# Patient Record
Sex: Male | Born: 1945
Health system: Southern US, Community
[De-identification: ages and names within clinical notes are randomized; demographics above are authoritative.]

## PROBLEM LIST (undated history)

## (undated) DIAGNOSIS — I2510 Atherosclerotic heart disease of native coronary artery without angina pectoris: Secondary | ICD-10-CM

## (undated) DIAGNOSIS — J189 Pneumonia, unspecified organism: Secondary | ICD-10-CM

## (undated) DIAGNOSIS — K219 Gastro-esophageal reflux disease without esophagitis: Secondary | ICD-10-CM

## (undated) DIAGNOSIS — M2540 Effusion, unspecified joint: Secondary | ICD-10-CM

## (undated) DIAGNOSIS — R351 Nocturia: Secondary | ICD-10-CM

## (undated) DIAGNOSIS — F419 Anxiety disorder, unspecified: Secondary | ICD-10-CM

## (undated) DIAGNOSIS — J45909 Unspecified asthma, uncomplicated: Secondary | ICD-10-CM

## (undated) DIAGNOSIS — H269 Unspecified cataract: Secondary | ICD-10-CM

## (undated) DIAGNOSIS — I10 Essential (primary) hypertension: Secondary | ICD-10-CM

## (undated) DIAGNOSIS — K5900 Constipation, unspecified: Secondary | ICD-10-CM

## (undated) DIAGNOSIS — M1990 Unspecified osteoarthritis, unspecified site: Secondary | ICD-10-CM

## (undated) DIAGNOSIS — M549 Dorsalgia, unspecified: Secondary | ICD-10-CM

## (undated) DIAGNOSIS — E785 Hyperlipidemia, unspecified: Secondary | ICD-10-CM

## (undated) DIAGNOSIS — R531 Weakness: Secondary | ICD-10-CM

## (undated) DIAGNOSIS — Z87442 Personal history of urinary calculi: Secondary | ICD-10-CM

## (undated) DIAGNOSIS — G8929 Other chronic pain: Secondary | ICD-10-CM

## (undated) DIAGNOSIS — E876 Hypokalemia: Secondary | ICD-10-CM

## (undated) DIAGNOSIS — E119 Type 2 diabetes mellitus without complications: Secondary | ICD-10-CM

## (undated) DIAGNOSIS — M797 Fibromyalgia: Secondary | ICD-10-CM

## (undated) DIAGNOSIS — M2550 Pain in unspecified joint: Secondary | ICD-10-CM

## (undated) DIAGNOSIS — F32A Depression, unspecified: Secondary | ICD-10-CM

## (undated) DIAGNOSIS — F329 Major depressive disorder, single episode, unspecified: Secondary | ICD-10-CM

## (undated) DIAGNOSIS — G4700 Insomnia, unspecified: Secondary | ICD-10-CM

## (undated) HISTORY — PX: CARPAL TUNNEL RELEASE: SHX101

## (undated) HISTORY — PX: KNEE ARTHROSCOPY: SUR90

## (undated) HISTORY — PX: CARDIAC CATHETERIZATION: SHX172

## (undated) HISTORY — PX: BACK SURGERY: SHX140

## (undated) HISTORY — PX: OTHER SURGICAL HISTORY: SHX169

## (undated) HISTORY — PX: SHOULDER ARTHROSCOPY: SHX128

## (undated) HISTORY — PX: COLONOSCOPY: SHX174

## (undated) HISTORY — PX: TONSILLECTOMY: SUR1361

---

## 1983-10-26 HISTORY — PX: CHOLECYSTECTOMY: SHX55

## 1994-10-25 HISTORY — PX: CORONARY ARTERY BYPASS GRAFT: SHX141

## 1997-10-25 HISTORY — PX: JOINT REPLACEMENT: SHX530

## 2008-07-04 LAB — HM COLONOSCOPY

## 2010-09-25 ENCOUNTER — Inpatient Hospital Stay (HOSPITAL_COMMUNITY)
Admission: EM | Admit: 2010-09-25 | Discharge: 2010-10-03 | Payer: Self-pay | Source: Home / Self Care | Attending: Internal Medicine | Admitting: Internal Medicine

## 2011-01-05 LAB — CBC
HCT: 42.7 % (ref 39.0–52.0)
HCT: 43.9 % (ref 39.0–52.0)
HCT: 44 % (ref 39.0–52.0)
Hemoglobin: 13.4 g/dL (ref 13.0–17.0)
Hemoglobin: 13.9 g/dL (ref 13.0–17.0)
Hemoglobin: 14.3 g/dL (ref 13.0–17.0)
Hemoglobin: 14.7 g/dL (ref 13.0–17.0)
MCH: 29.6 pg (ref 26.0–34.0)
MCH: 29.7 pg (ref 26.0–34.0)
MCH: 30.1 pg (ref 26.0–34.0)
MCHC: 32.5 g/dL (ref 30.0–36.0)
MCHC: 32.9 g/dL (ref 30.0–36.0)
MCHC: 33.5 g/dL (ref 30.0–36.0)
MCV: 88.7 fL (ref 78.0–100.0)
MCV: 91.5 fL (ref 78.0–100.0)
Platelets: 201 10*3/uL (ref 150–400)
Platelets: 214 10*3/uL (ref 150–400)
Platelets: 226 10*3/uL (ref 150–400)
Platelets: 228 10*3/uL (ref 150–400)
Platelets: 231 10*3/uL (ref 150–400)
RBC: 4.7 MIL/uL (ref 4.22–5.81)
RBC: 4.81 MIL/uL (ref 4.22–5.81)
RBC: 4.86 MIL/uL (ref 4.22–5.81)
RDW: 13.8 % (ref 11.5–15.5)
RDW: 13.8 % (ref 11.5–15.5)
RDW: 14.2 % (ref 11.5–15.5)
WBC: 12 10*3/uL — ABNORMAL HIGH (ref 4.0–10.5)
WBC: 12.5 10*3/uL — ABNORMAL HIGH (ref 4.0–10.5)
WBC: 12.9 10*3/uL — ABNORMAL HIGH (ref 4.0–10.5)
WBC: 15.3 10*3/uL — ABNORMAL HIGH (ref 4.0–10.5)

## 2011-01-05 LAB — DIFFERENTIAL
Basophils Absolute: 0 10*3/uL (ref 0.0–0.1)
Basophils Absolute: 0 10*3/uL (ref 0.0–0.1)
Basophils Relative: 0 % (ref 0–1)
Monocytes Absolute: 0.4 10*3/uL (ref 0.1–1.0)
Monocytes Absolute: 1 10*3/uL (ref 0.1–1.0)
Monocytes Relative: 7 % (ref 3–12)
Neutro Abs: 11.2 10*3/uL — ABNORMAL HIGH (ref 1.7–7.7)
Neutrophils Relative %: 80 % — ABNORMAL HIGH (ref 43–77)
Neutrophils Relative %: 90 % — ABNORMAL HIGH (ref 43–77)

## 2011-01-05 LAB — BASIC METABOLIC PANEL
BUN: 23 mg/dL (ref 6–23)
BUN: 25 mg/dL — ABNORMAL HIGH (ref 6–23)
CO2: 27 mEq/L (ref 19–32)
Chloride: 103 mEq/L (ref 96–112)
Creatinine, Ser: 0.85 mg/dL (ref 0.4–1.5)
Creatinine, Ser: 0.96 mg/dL (ref 0.4–1.5)
GFR calc Af Amer: >60 mL/min (ref >60)
GFR calc non Af Amer: >60 mL/min (ref >60)
GFR calc non Af Amer: >60 mL/min (ref >60)
Glucose, Bld: 124 mg/dL — ABNORMAL HIGH (ref 70–99)
Sodium: 135 mEq/L (ref 135–145)

## 2011-01-05 LAB — COMPREHENSIVE METABOLIC PANEL
ALT: 39 U/L (ref 0–53)
AST: 24 U/L (ref 0–37)
AST: 28 U/L (ref 0–37)
Albumin: 3.7 g/dL (ref 3.5–5.2)
Alkaline Phosphatase: 34 U/L — ABNORMAL LOW (ref 39–117)
Alkaline Phosphatase: 45 U/L (ref 39–117)
CO2: 28 mEq/L (ref 19–32)
Calcium: 9.1 mg/dL (ref 8.4–10.5)
Chloride: 104 mEq/L (ref 96–112)
Chloride: 97 mEq/L (ref 96–112)
Chloride: 99 mEq/L (ref 96–112)
Creatinine, Ser: 0.95 mg/dL (ref 0.4–1.5)
GFR calc Af Amer: >60 mL/min (ref >60)
GFR calc Af Amer: >60 mL/min (ref >60)
GFR calc non Af Amer: >60 mL/min (ref >60)
GFR calc non Af Amer: >60 mL/min (ref >60)
GFR calc non Af Amer: >60 mL/min (ref >60)
Glucose, Bld: 131 mg/dL — ABNORMAL HIGH (ref 70–99)
Glucose, Bld: 133 mg/dL — ABNORMAL HIGH (ref 70–99)
Glucose, Bld: 178 mg/dL — ABNORMAL HIGH (ref 70–99)
Total Bilirubin: 0.6 mg/dL (ref 0.3–1.2)
Total Bilirubin: 0.7 mg/dL (ref 0.3–1.2)

## 2011-01-05 LAB — LIPID PANEL
Cholesterol: 181 mg/dL (ref 0–200)
HDL: 48 mg/dL (ref >39)
Total CHOL/HDL Ratio: 3.8 RATIO
Triglycerides: 87 mg/dL (ref <150)
VLDL: 17 mg/dL (ref 0–40)

## 2011-01-05 LAB — HEMOGLOBIN A1C
Hgb A1c MFr Bld: 5.7 % — ABNORMAL HIGH (ref <5.7)
Mean Plasma Glucose: 114 mg/dL (ref <117)
Mean Plasma Glucose: 117 mg/dL — ABNORMAL HIGH (ref <117)

## 2011-01-05 LAB — APTT: aPTT: 24 seconds (ref 24–37)

## 2011-02-18 ENCOUNTER — Other Ambulatory Visit: Payer: Self-pay | Admitting: Neurosurgery

## 2011-02-18 DIAGNOSIS — M545 Low back pain: Secondary | ICD-10-CM

## 2011-02-25 ENCOUNTER — Ambulatory Visit
Admission: RE | Admit: 2011-02-25 | Discharge: 2011-02-25 | Disposition: A | Discharge status: Home / Self Care | Payer: Medicare Other | Source: Ambulatory Visit | Attending: Neurosurgery | Admitting: Neurosurgery

## 2011-02-25 DIAGNOSIS — M545 Low back pain, unspecified: Secondary | ICD-10-CM

## 2011-02-25 MED ORDER — GADOBENATE DIMEGLUMINE 529 MG/ML IV SOLN
20.0000 mL | Freq: Once | INTRAVENOUS | Status: AC | PRN
  Administered 2011-02-25: 20 mL via INTRAVENOUS

## 2012-01-13 ENCOUNTER — Other Ambulatory Visit: Payer: Self-pay | Admitting: Neurosurgery

## 2012-01-13 DIAGNOSIS — M5126 Other intervertebral disc displacement, lumbar region: Secondary | ICD-10-CM

## 2012-01-17 ENCOUNTER — Ambulatory Visit
Admission: RE | Admit: 2012-01-17 | Discharge: 2012-01-17 | Disposition: A | Discharge status: Home / Self Care | Payer: Medicare Other | Source: Ambulatory Visit | Attending: Neurosurgery | Admitting: Neurosurgery

## 2012-01-17 DIAGNOSIS — M5126 Other intervertebral disc displacement, lumbar region: Secondary | ICD-10-CM

## 2013-09-17 ENCOUNTER — Other Ambulatory Visit: Payer: Self-pay | Admitting: Neurosurgery

## 2013-09-17 NOTE — Progress Notes (Signed)
Left message for Erie Noe for orders to be signed

## 2013-09-18 ENCOUNTER — Encounter (HOSPITAL_COMMUNITY)
Admission: RE | Admit: 2013-09-18 | Discharge: 2013-09-18 | Disposition: A | Discharge status: Home / Self Care | Payer: Medicare Other | Source: Ambulatory Visit | Attending: Neurosurgery | Admitting: Neurosurgery

## 2013-09-18 ENCOUNTER — Encounter (HOSPITAL_COMMUNITY): Payer: Self-pay

## 2013-09-18 DIAGNOSIS — Z01812 Encounter for preprocedural laboratory examination: Secondary | ICD-10-CM | POA: Insufficient documentation

## 2013-09-18 DIAGNOSIS — Z01818 Encounter for other preprocedural examination: Secondary | ICD-10-CM | POA: Insufficient documentation

## 2013-09-18 HISTORY — DX: Depression, unspecified: F32.A

## 2013-09-18 HISTORY — DX: Unspecified osteoarthritis, unspecified site: M19.90

## 2013-09-18 HISTORY — DX: Anxiety disorder, unspecified: F41.9

## 2013-09-18 HISTORY — DX: Atherosclerotic heart disease of native coronary artery without angina pectoris: I25.10

## 2013-09-18 HISTORY — DX: Unspecified asthma, uncomplicated: J45.909

## 2013-09-18 HISTORY — DX: Major depressive disorder, single episode, unspecified: F32.9

## 2013-09-18 LAB — CBC
HCT: 40.8 % (ref 39.0–52.0)
MCV: 89.9 fL (ref 78.0–100.0)
Platelets: 218 10*3/uL (ref 150–400)
RDW: 14 % (ref 11.5–15.5)
WBC: 6.5 10*3/uL (ref 4.0–10.5)

## 2013-09-18 LAB — BASIC METABOLIC PANEL
BUN: 18 mg/dL (ref 6–23)
CO2: 28 mEq/L (ref 19–32)
Chloride: 101 mEq/L (ref 96–112)
Creatinine, Ser: 0.85 mg/dL (ref 0.50–1.35)
GFR calc Af Amer: >90 mL/min (ref >90)

## 2013-09-18 LAB — SURGICAL PCR SCREEN: MRSA, PCR: NEGATIVE

## 2013-09-18 NOTE — Pre-Procedure Instructions (Signed)
Michael Barr  09/18/2013   Your procedure is scheduled on:  Wed, Dec 3 @ 9:30 AM  Report to [facility] Short Stay Entrance A at 7:30 AM.  Call this number if you have problems the morning of surgery: [phone]   Remember:   Do not eat food or drink liquids after midnight.   Take these medicines the morning of surgery with A SIP OF WATER:    Do not wear jewelry.  Do not wear lotions, powders, or colognes. You may wear deodorant.  Men may shave face and neck.  Do not bring valuables to the hospital.  [facility] is not responsible                  for any belongings or valuables.               Contacts, dentures or bridgework may not be worn into surgery.  Leave suitcase in the car. After surgery it may be brought to your room.  For patients admitted to the hospital, discharge time is determined by your                treatment team.               Patients discharged the day of surgery will not be allowed to drive  home.    Special Instructions: Shower using CHG 2 nights before surgery and the night before surgery.  If you shower the day of surgery use CHG.  Use special wash - you have one bottle of CHG for all showers.  You should use approximately 1/3 of the bottle for each shower.   Please read over the following fact sheets that you were given: Pain Booklet, Coughing and Deep Breathing, MRSA Information and Surgical Site Infection Prevention

## 2013-09-18 NOTE — Progress Notes (Signed)
Anesthesia Chart Review:  Patient is a 67 year old male scheduled for right L3-4 laminectomy on 09/26/13 by Dr. Wynetta Emery.  History includes CAD s/p CABG X 1 '96, asthma, anxiety, depression, arthritis, cholecystectomy, left TKA, decompressive L3-4 laminectomy 10/21/10. PCP is Dr. Mechele Collin.    I was given patient's chart to review after he had left his PAT visit.  I was not able to reach him this afternoon on his house phone.  According to his PAT RN, patient underwent CABG X 1 (questionable LIMA to LAD) for "widow maker" lesion.  His previous cardiologist retired, so now he is being followed by his Internist Dr. Tiburcio Pea.  His last stress test was > 5 years ago. Dr. Tiburcio Pea is aware of plans for surgery and saw patient on 09/04/13.  He did a preoperative CXR and EKG.  Patient was asymptomatic from a CAD standpoint and EKG was stable so no further testing was ordered.  EKG on 09/04/13 (Dr. Tiburcio Pea) showed NSR, inferior Q waves that were stable since 2013.  No worrisome ST/T wave changes.  CXR report on 09/04/13 (Dr. Tiburcio Pea) showed: no evidence of cardiomegaly or CHF, well-expanded lungs free of infiltrates, nodules, mass, or pleural effusion. Bones and mediastinum are normal.  No evidence of aucte or chronic cardiopulmonary process.  Preoperative labs noted.  Chart and PCP notes reviewed with anesthesiologist Dr. Michelle Piper.  Since patient was recently seen by Dr. Tiburcio Pea, who is aware of plans for surgery, and patient was felt stable from a CAD standpoint then it is anticipated that he can proceed as planned if no acute changes.  Further evaluation by his assigned anesthesiologist on the day of surgery.  Velna Ochs [facility] Short Stay [facility]/Anesthesiology Phone [phone] 09/18/2013 4:36 PM

## 2013-09-18 NOTE — Progress Notes (Signed)
Ekg,cxr,last stress test req. From Dr Tiburcio Pea in [place].

## 2013-09-25 MED ORDER — CEFAZOLIN SODIUM-DEXTROSE 2-3 GM-% IV SOLR
2.0000 g | INTRAVENOUS | Status: DC
  Filled 2013-09-25: qty 50

## 2013-09-25 MED ORDER — DEXAMETHASONE SODIUM PHOSPHATE 10 MG/ML IJ SOLN
10.0000 mg | INTRAMUSCULAR | Status: AC
  Administered 2013-09-26: 10 mg via INTRAVENOUS
  Filled 2013-09-25: qty 1

## 2013-09-26 ENCOUNTER — Encounter (HOSPITAL_COMMUNITY): Payer: Self-pay | Admitting: Certified Registered Nurse Anesthetist

## 2013-09-26 ENCOUNTER — Inpatient Hospital Stay (HOSPITAL_COMMUNITY)
Admission: RE | Admit: 2013-09-26 | Discharge: 2013-09-27 | DRG: 520 | Disposition: A | Discharge status: Home / Self Care | Payer: Medicare Other | Source: Ambulatory Visit | Attending: Neurosurgery | Admitting: Neurosurgery

## 2013-09-26 ENCOUNTER — Inpatient Hospital Stay (HOSPITAL_COMMUNITY): Payer: Medicare Other

## 2013-09-26 ENCOUNTER — Encounter (HOSPITAL_COMMUNITY): Payer: Medicare Other | Admitting: Vascular Surgery

## 2013-09-26 ENCOUNTER — Inpatient Hospital Stay (HOSPITAL_COMMUNITY): Payer: Medicare Other | Admitting: Certified Registered Nurse Anesthetist

## 2013-09-26 ENCOUNTER — Encounter (HOSPITAL_COMMUNITY)
Admission: RE | Disposition: A | Discharge status: Home / Self Care | Payer: Self-pay | Source: Ambulatory Visit | Attending: Neurosurgery

## 2013-09-26 DIAGNOSIS — Z96659 Presence of unspecified artificial knee joint: Secondary | ICD-10-CM

## 2013-09-26 DIAGNOSIS — Z951 Presence of aortocoronary bypass graft: Secondary | ICD-10-CM

## 2013-09-26 DIAGNOSIS — Z9089 Acquired absence of other organs: Secondary | ICD-10-CM

## 2013-09-26 DIAGNOSIS — M5126 Other intervertebral disc displacement, lumbar region: Principal | ICD-10-CM | POA: Diagnosis present

## 2013-09-26 DIAGNOSIS — I10 Essential (primary) hypertension: Secondary | ICD-10-CM | POA: Diagnosis present

## 2013-09-26 DIAGNOSIS — J45909 Unspecified asthma, uncomplicated: Secondary | ICD-10-CM | POA: Diagnosis present

## 2013-09-26 DIAGNOSIS — E119 Type 2 diabetes mellitus without complications: Secondary | ICD-10-CM | POA: Diagnosis present

## 2013-09-26 DIAGNOSIS — Z880 Allergy status to penicillin: Secondary | ICD-10-CM

## 2013-09-26 DIAGNOSIS — Z7982 Long term (current) use of aspirin: Secondary | ICD-10-CM

## 2013-09-26 DIAGNOSIS — Z79899 Other long term (current) drug therapy: Secondary | ICD-10-CM

## 2013-09-26 DIAGNOSIS — I2510 Atherosclerotic heart disease of native coronary artery without angina pectoris: Secondary | ICD-10-CM | POA: Diagnosis present

## 2013-09-26 DIAGNOSIS — F411 Generalized anxiety disorder: Secondary | ICD-10-CM | POA: Diagnosis present

## 2013-09-26 DIAGNOSIS — F3289 Other specified depressive episodes: Secondary | ICD-10-CM | POA: Diagnosis present

## 2013-09-26 DIAGNOSIS — F329 Major depressive disorder, single episode, unspecified: Secondary | ICD-10-CM | POA: Diagnosis present

## 2013-09-26 DIAGNOSIS — Z87891 Personal history of nicotine dependence: Secondary | ICD-10-CM

## 2013-09-26 HISTORY — PX: LUMBAR LAMINECTOMY/DECOMPRESSION MICRODISCECTOMY: SHX5026

## 2013-09-26 LAB — GLUCOSE, CAPILLARY

## 2013-09-26 SURGERY — LUMBAR LAMINECTOMY/DECOMPRESSION MICRODISCECTOMY 1 LEVEL
Anesthesia: General | Site: Back | Laterality: Right

## 2013-09-26 MED ORDER — LISINOPRIL 20 MG PO TABS
20.0000 mg | ORAL_TABLET | Freq: Every day | ORAL | Status: DC
  Administered 2013-09-26 – 2013-09-27 (×2): 20 mg via ORAL
  Filled 2013-09-26 (×2): qty 1

## 2013-09-26 MED ORDER — CYCLOBENZAPRINE HCL 10 MG PO TABS
10.0000 mg | ORAL_TABLET | Freq: Three times a day (TID) | ORAL | Status: DC | PRN
  Administered 2013-09-26: 10 mg via ORAL

## 2013-09-26 MED ORDER — DOCUSATE SODIUM 100 MG PO CAPS
100.0000 mg | ORAL_CAPSULE | Freq: Two times a day (BID) | ORAL | Status: DC
  Administered 2013-09-26 – 2013-09-27 (×2): 100 mg via ORAL
  Filled 2013-09-26 (×3): qty 1

## 2013-09-26 MED ORDER — LACTATED RINGERS IV SOLN
INTRAVENOUS | Status: DC
  Administered 2013-09-26: 50 mL/h via INTRAVENOUS

## 2013-09-26 MED ORDER — METHADONE HCL 10 MG PO TABS
10.0000 mg | ORAL_TABLET | Freq: Three times a day (TID) | ORAL | Status: DC
  Administered 2013-09-26 – 2013-09-27 (×2): 10 mg via ORAL
  Filled 2013-09-26 (×2): qty 1

## 2013-09-26 MED ORDER — PHENYLEPHRINE HCL 10 MG/ML IJ SOLN
INTRAMUSCULAR | Status: DC | PRN
  Administered 2013-09-26 (×4): 80 ug via INTRAVENOUS
  Administered 2013-09-26 (×2): 40 ug via INTRAVENOUS

## 2013-09-26 MED ORDER — PHENOL 1.4 % MT LIQD: 1.0000 | OROMUCOSAL | Status: DC | PRN

## 2013-09-26 MED ORDER — ACETAMINOPHEN 325 MG PO TABS: 650.0000 mg | ORAL_TABLET | ORAL | Status: DC | PRN

## 2013-09-26 MED ORDER — SODIUM CHLORIDE 0.9 % IV SOLN
1000.0000 mg | INTRAVENOUS | Status: DC | PRN
  Administered 2013-09-26: 1500 mg via INTRAVENOUS

## 2013-09-26 MED ORDER — 0.9 % SODIUM CHLORIDE (POUR BTL) OPTIME
TOPICAL | Status: DC | PRN
  Administered 2013-09-26: 1000 mL

## 2013-09-26 MED ORDER — LIDOCAINE-EPINEPHRINE 1 %-1:100000 IJ SOLN
INTRAMUSCULAR | Status: DC | PRN
  Administered 2013-09-26: 10 mL

## 2013-09-26 MED ORDER — HYDROMORPHONE HCL PF 1 MG/ML IJ SOLN
0.5000 mg | INTRAMUSCULAR | Status: DC | PRN
  Administered 2013-09-26: 1 mg via INTRAVENOUS
  Filled 2013-09-26: qty 1

## 2013-09-26 MED ORDER — GLYCOPYRROLATE 0.2 MG/ML IJ SOLN
INTRAMUSCULAR | Status: DC | PRN
  Administered 2013-09-26: .8 mg via INTRAVENOUS

## 2013-09-26 MED ORDER — TRAZODONE HCL 150 MG PO TABS
250.0000 mg | ORAL_TABLET | Freq: Every day | ORAL | Status: DC
  Administered 2013-09-26: 250 mg via ORAL
  Filled 2013-09-26 (×2): qty 1

## 2013-09-26 MED ORDER — MORPHINE SULFATE 15 MG PO TABS
30.0000 mg | ORAL_TABLET | ORAL | Status: DC | PRN
  Administered 2013-09-26 – 2013-09-27 (×5): 30 mg via ORAL
  Filled 2013-09-26 (×5): qty 2

## 2013-09-26 MED ORDER — ALBUTEROL SULFATE HFA 108 (90 BASE) MCG/ACT IN AERS: 1.0000 | INHALATION_SPRAY | Freq: Four times a day (QID) | RESPIRATORY_TRACT | Status: DC | PRN

## 2013-09-26 MED ORDER — ONDANSETRON HCL 4 MG/2ML IJ SOLN: 4.0000 mg | Freq: Once | INTRAMUSCULAR | Status: DC | PRN

## 2013-09-26 MED ORDER — ALPRAZOLAM 0.5 MG PO TABS
0.5000 mg | ORAL_TABLET | Freq: Three times a day (TID) | ORAL | Status: DC | PRN
  Administered 2013-09-27: 0.5 mg via ORAL
  Filled 2013-09-26: qty 1

## 2013-09-26 MED ORDER — VANCOMYCIN HCL IN DEXTROSE 1-5 GM/200ML-% IV SOLN
1000.0000 mg | INTRAVENOUS | Status: DC
  Filled 2013-09-26: qty 200

## 2013-09-26 MED ORDER — ONDANSETRON HCL 4 MG/2ML IJ SOLN: 4.0000 mg | INTRAMUSCULAR | Status: DC | PRN

## 2013-09-26 MED ORDER — CEFAZOLIN SODIUM 1-5 GM-% IV SOLN: 1.0000 g | Freq: Three times a day (TID) | INTRAVENOUS | Status: DC

## 2013-09-26 MED ORDER — MULTI-VITAMIN/MINERALS PO TABS: 1.0000 | ORAL_TABLET | Freq: Every day | ORAL | Status: DC

## 2013-09-26 MED ORDER — ALUM & MAG HYDROXIDE-SIMETH 200-200-20 MG/5ML PO SUSP: 30.0000 mL | Freq: Four times a day (QID) | ORAL | Status: DC | PRN

## 2013-09-26 MED ORDER — ACETAMINOPHEN 650 MG RE SUPP: 650.0000 mg | RECTAL | Status: DC | PRN

## 2013-09-26 MED ORDER — VANCOMYCIN HCL IN DEXTROSE 1-5 GM/200ML-% IV SOLN
1000.0000 mg | Freq: Once | INTRAVENOUS | Status: AC
  Administered 2013-09-26: 1000 mg via INTRAVENOUS
  Filled 2013-09-26: qty 200

## 2013-09-26 MED ORDER — PROPOFOL 10 MG/ML IV BOLUS
INTRAVENOUS | Status: DC | PRN
  Administered 2013-09-26: 290 mg via INTRAVENOUS

## 2013-09-26 MED ORDER — SODIUM CHLORIDE 0.9 % IJ SOLN: 3.0000 mL | INTRAMUSCULAR | Status: DC | PRN

## 2013-09-26 MED ORDER — LACTATED RINGERS IV SOLN
INTRAVENOUS | Status: DC | PRN
  Administered 2013-09-26 (×2): via INTRAVENOUS

## 2013-09-26 MED ORDER — SODIUM CHLORIDE 0.9 % IJ SOLN
3.0000 mL | Freq: Two times a day (BID) | INTRAMUSCULAR | Status: DC
  Administered 2013-09-26 – 2013-09-27 (×2): 3 mL via INTRAVENOUS

## 2013-09-26 MED ORDER — HEMOSTATIC AGENTS (NO CHARGE) OPTIME
TOPICAL | Status: DC | PRN
  Administered 2013-09-26: 1 via TOPICAL

## 2013-09-26 MED ORDER — HYDROMORPHONE HCL PF 1 MG/ML IJ SOLN
INTRAMUSCULAR | Status: AC
  Administered 2013-09-26: 0.5 mg via INTRAVENOUS
  Filled 2013-09-26: qty 1

## 2013-09-26 MED ORDER — HYDROCHLOROTHIAZIDE 25 MG PO TABS
25.0000 mg | ORAL_TABLET | Freq: Every day | ORAL | Status: DC
  Administered 2013-09-26 – 2013-09-27 (×2): 25 mg via ORAL
  Filled 2013-09-26 (×2): qty 1

## 2013-09-26 MED ORDER — ADULT MULTIVITAMIN W/MINERALS CH
1.0000 | ORAL_TABLET | Freq: Every day | ORAL | Status: DC
  Administered 2013-09-26 – 2013-09-27 (×2): 1 via ORAL
  Filled 2013-09-26 (×2): qty 1

## 2013-09-26 MED ORDER — ACETAMINOPHEN 500 MG PO TABS: 500.0000 mg | ORAL_TABLET | Freq: Four times a day (QID) | ORAL | Status: DC | PRN

## 2013-09-26 MED ORDER — ARTIFICIAL TEARS OP OINT
TOPICAL_OINTMENT | OPHTHALMIC | Status: DC | PRN
  Administered 2013-09-26: 1 via OPHTHALMIC

## 2013-09-26 MED ORDER — BUPIVACAINE HCL (PF) 0.25 % IJ SOLN
INTRAMUSCULAR | Status: DC | PRN
  Administered 2013-09-26: 10 mL

## 2013-09-26 MED ORDER — SUCCINYLCHOLINE CHLORIDE 20 MG/ML IJ SOLN
INTRAMUSCULAR | Status: DC | PRN
  Administered 2013-09-26: 140 mg via INTRAVENOUS

## 2013-09-26 MED ORDER — PHENYLEPHRINE HCL 10 MG/ML IJ SOLN
10.0000 mg | INTRAVENOUS | Status: DC | PRN
  Administered 2013-09-26: 20 ug/min via INTRAVENOUS

## 2013-09-26 MED ORDER — NAPROXEN 500 MG PO TABS
500.0000 mg | ORAL_TABLET | Freq: Two times a day (BID) | ORAL | Status: DC
  Administered 2013-09-26 – 2013-09-27 (×2): 500 mg via ORAL
  Filled 2013-09-26 (×4): qty 1

## 2013-09-26 MED ORDER — LISINOPRIL-HYDROCHLOROTHIAZIDE 20-25 MG PO TABS: 1.0000 | ORAL_TABLET | Freq: Every day | ORAL | Status: DC

## 2013-09-26 MED ORDER — ASPIRIN EC 325 MG PO TBEC
325.0000 mg | DELAYED_RELEASE_TABLET | Freq: Every day | ORAL | Status: DC
Start: 2013-09-26 — End: 2013-09-27
  Administered 2013-09-26 – 2013-09-27 (×2): 325 mg via ORAL
  Filled 2013-09-26 (×2): qty 1

## 2013-09-26 MED ORDER — OXYCODONE HCL 10 MG PO TABS: 10.0000 mg | ORAL_TABLET | Freq: Three times a day (TID) | ORAL | Status: DC | PRN

## 2013-09-26 MED ORDER — ROCURONIUM BROMIDE 100 MG/10ML IV SOLN
INTRAVENOUS | Status: DC | PRN
  Administered 2013-09-26 (×2): 10 mg via INTRAVENOUS
  Administered 2013-09-26: 50 mg via INTRAVENOUS

## 2013-09-26 MED ORDER — CYCLOBENZAPRINE HCL 10 MG PO TABS
ORAL_TABLET | ORAL | Status: AC
  Administered 2013-09-26: 10 mg via ORAL
  Filled 2013-09-26: qty 1

## 2013-09-26 MED ORDER — SIMVASTATIN 40 MG PO TABS
40.0000 mg | ORAL_TABLET | Freq: Every day | ORAL | Status: DC
  Administered 2013-09-26: 40 mg via ORAL
  Filled 2013-09-26 (×2): qty 1

## 2013-09-26 MED ORDER — VANCOMYCIN HCL 10 G IV SOLR
1500.0000 mg | INTRAVENOUS | Status: DC
  Filled 2013-09-26 (×2): qty 1500

## 2013-09-26 MED ORDER — NEOSTIGMINE METHYLSULFATE 1 MG/ML IJ SOLN
INTRAMUSCULAR | Status: DC | PRN
  Administered 2013-09-26: 5 mg via INTRAVENOUS

## 2013-09-26 MED ORDER — SODIUM CHLORIDE 0.9 % IV SOLN: 250.0000 mL | INTRAVENOUS | Status: DC

## 2013-09-26 MED ORDER — LIDOCAINE HCL (CARDIAC) 20 MG/ML IV SOLN
INTRAVENOUS | Status: DC | PRN
  Administered 2013-09-26: 100 mg via INTRAVENOUS

## 2013-09-26 MED ORDER — MENTHOL 3 MG MT LOZG: 1.0000 | LOZENGE | OROMUCOSAL | Status: DC | PRN

## 2013-09-26 MED ORDER — THROMBIN 5000 UNITS EX SOLR
CUTANEOUS | Status: DC | PRN
  Administered 2013-09-26 (×2): 5000 [IU] via TOPICAL

## 2013-09-26 MED ORDER — SENNA 8.6 MG PO TABS
2.0000 | ORAL_TABLET | Freq: Every day | ORAL | Status: DC
  Administered 2013-09-26: 17.2 mg via ORAL
  Filled 2013-09-26 (×2): qty 2

## 2013-09-26 MED ORDER — ONDANSETRON HCL 4 MG/2ML IJ SOLN
INTRAMUSCULAR | Status: DC | PRN
  Administered 2013-09-26: 4 mg via INTRAVENOUS

## 2013-09-26 MED ORDER — FENTANYL CITRATE 0.05 MG/ML IJ SOLN
INTRAMUSCULAR | Status: DC | PRN
  Administered 2013-09-26: 150 ug via INTRAVENOUS

## 2013-09-26 MED ORDER — OXYCODONE-ACETAMINOPHEN 5-325 MG PO TABS
ORAL_TABLET | ORAL | Status: AC
  Administered 2013-09-26: 2 via ORAL
  Filled 2013-09-26: qty 2

## 2013-09-26 MED ORDER — HYDROMORPHONE HCL PF 1 MG/ML IJ SOLN
0.2500 mg | INTRAMUSCULAR | Status: DC | PRN
  Administered 2013-09-26 (×4): 0.5 mg via INTRAVENOUS

## 2013-09-26 MED ORDER — OXYCODONE-ACETAMINOPHEN 5-325 MG PO TABS
1.0000 | ORAL_TABLET | ORAL | Status: DC | PRN
  Administered 2013-09-26 (×2): 2 via ORAL
  Filled 2013-09-26: qty 2

## 2013-09-26 MED ORDER — SODIUM CHLORIDE 0.9 % IR SOLN
Status: DC | PRN
  Administered 2013-09-26: 10:00:00

## 2013-09-26 SURGICAL SUPPLY — 55 items
BAG DECANTER FOR FLEXI CONT (MISCELLANEOUS) ×2 IMPLANT
BENZOIN TINCTURE PRP APPL 2/3 (GAUZE/BANDAGES/DRESSINGS) ×2 IMPLANT
BLADE SURG 11 STRL SS (BLADE) ×2 IMPLANT
BLADE SURG ROTATE 9660 (MISCELLANEOUS) IMPLANT
BRUSH SCRUB EZ PLAIN DRY (MISCELLANEOUS) ×2 IMPLANT
BUR MATCHSTICK NEURO 3.0 LAGG (BURR) ×2 IMPLANT
BUR PRECISION FLUTE 6.0 (BURR) ×6 IMPLANT
CANISTER SUCT 3000ML (MISCELLANEOUS) ×2 IMPLANT
CONT SPEC 4OZ CLIKSEAL STRL BL (MISCELLANEOUS) ×2 IMPLANT
DECANTER SPIKE VIAL GLASS SM (MISCELLANEOUS) ×2 IMPLANT
DERMABOND ADVANCED (GAUZE/BANDAGES/DRESSINGS) ×1
DERMABOND ADVANCED .7 DNX12 (GAUZE/BANDAGES/DRESSINGS) ×1 IMPLANT
DRAPE LAPAROTOMY 100X72X124 (DRAPES) ×2 IMPLANT
DRAPE MICROSCOPE ZEISS OPMI (DRAPES) ×2 IMPLANT
DRAPE POUCH INSTRU U-SHP 10X18 (DRAPES) ×2 IMPLANT
DRAPE PROXIMA HALF (DRAPES) IMPLANT
DRAPE SURG 17X23 STRL (DRAPES) ×2 IMPLANT
DRSG OPSITE 4X5.5 SM (GAUZE/BANDAGES/DRESSINGS) IMPLANT
DRSG OPSITE POSTOP 4X6 (GAUZE/BANDAGES/DRESSINGS) ×2 IMPLANT
DURAPREP 26ML APPLICATOR (WOUND CARE) ×2 IMPLANT
ELECT BLADE 4.0 EZ CLEAN MEGAD (MISCELLANEOUS) ×2
ELECT REM PT RETURN 9FT ADLT (ELECTROSURGICAL) ×2
ELECTRODE BLDE 4.0 EZ CLN MEGD (MISCELLANEOUS) ×1 IMPLANT
ELECTRODE REM PT RTRN 9FT ADLT (ELECTROSURGICAL) ×1 IMPLANT
GAUZE SPONGE 4X4 16PLY XRAY LF (GAUZE/BANDAGES/DRESSINGS) IMPLANT
GLOVE BIO SURGEON STRL SZ8 (GLOVE) ×2 IMPLANT
GLOVE ECLIPSE 8.5 STRL (GLOVE) ×2 IMPLANT
GLOVE EXAM NITRILE LRG STRL (GLOVE) ×2 IMPLANT
GLOVE EXAM NITRILE MD LF STRL (GLOVE) IMPLANT
GLOVE EXAM NITRILE XL STR (GLOVE) IMPLANT
GLOVE EXAM NITRILE XS STR PU (GLOVE) IMPLANT
GLOVE INDICATOR 7.0 STRL GRN (GLOVE) ×4 IMPLANT
GLOVE INDICATOR 8.5 STRL (GLOVE) ×4 IMPLANT
GLOVE SURG SS PI 7.0 STRL IVOR (GLOVE) ×6 IMPLANT
GOWN BRE IMP SLV AUR LG STRL (GOWN DISPOSABLE) ×2 IMPLANT
GOWN BRE IMP SLV AUR XL STRL (GOWN DISPOSABLE) ×4 IMPLANT
GOWN STRL REIN 2XL LVL4 (GOWN DISPOSABLE) ×2 IMPLANT
KIT BASIN OR (CUSTOM PROCEDURE TRAY) ×2 IMPLANT
KIT ROOM TURNOVER OR (KITS) ×2 IMPLANT
NEEDLE HYPO 22GX1.5 SAFETY (NEEDLE) ×2 IMPLANT
NEEDLE SPNL 22GX3.5 QUINCKE BK (NEEDLE) ×2 IMPLANT
NS IRRIG 1000ML POUR BTL (IV SOLUTION) ×2 IMPLANT
PACK LAMINECTOMY NEURO (CUSTOM PROCEDURE TRAY) ×2 IMPLANT
RUBBERBAND STERILE (MISCELLANEOUS) ×4 IMPLANT
SPONGE GAUZE 4X4 12PLY (GAUZE/BANDAGES/DRESSINGS) ×2 IMPLANT
SPONGE SURGIFOAM ABS GEL SZ50 (HEMOSTASIS) ×2 IMPLANT
STRIP CLOSURE SKIN 1/2X4 (GAUZE/BANDAGES/DRESSINGS) ×2 IMPLANT
SUT VIC AB 0 CT1 18XCR BRD8 (SUTURE) ×1 IMPLANT
SUT VIC AB 0 CT1 8-18 (SUTURE) ×1
SUT VIC AB 2-0 CT1 18 (SUTURE) ×2 IMPLANT
SUT VICRYL 4-0 PS2 18IN ABS (SUTURE) ×2 IMPLANT
SYR 20ML ECCENTRIC (SYRINGE) ×2 IMPLANT
TOWEL OR 17X24 6PK STRL BLUE (TOWEL DISPOSABLE) ×2 IMPLANT
TOWEL OR 17X26 10 PK STRL BLUE (TOWEL DISPOSABLE) ×2 IMPLANT
WATER STERILE IRR 1000ML POUR (IV SOLUTION) ×2 IMPLANT

## 2013-09-26 NOTE — Preoperative (Signed)
Beta Blockers   Reason not to administer Beta Blockers:Not Applicable 

## 2013-09-26 NOTE — Transfer of Care (Signed)
Immediate Anesthesia Transfer of Care Note  Patient: Michael Barr  Procedure(s) Performed: Procedure(s): LUMBAR LAMINECTOMY/DECOMPRESSION MICRODISCECTOMY RIGHT THREE-FOUR (Right)  Patient Location: PACU  Anesthesia Type:General  Level of Consciousness: awake and alert   Airway & Oxygen Therapy: Patient Spontanous Breathing and Patient connected to nasal cannula oxygen  Post-op Assessment: Report given to PACU RN, Post -op Vital signs reviewed and stable and Patient moving all extremities X 4  Post vital signs: Reviewed and stable  Complications: No apparent anesthesia complications

## 2013-09-26 NOTE — H&P (Signed)
Michael Barr is an 67 y.o. male.   Chief Complaint: Back right hip and leg pain HPI: Patient is a very pleasant 67 year old some is a long-standing chronic low back pain he did not previous left-sided L3-4 laminectomy discectomy many years ago in [place] years he said refractory right L3 radiculopathy. Pain rate in his back in his right hip and his anterior quad occasional lateral quad but rarely if ever below the knee. Workup has revealed foraminal x-ray will disc herniation at L3-4 on the right he then to epidural steroid injections physical therapy anti-inflammatories and time but is not any better. Due to his failure conservative treatment imaging findings and progression of clinical syndrome I recommended an extra foraminal discectomy at L3-4 and the right I extensively reviewed the risks and benefits of the operation the patient as well as perioperative course and expectations of outcome and alternatives of surgery he understands and agrees to proceed forward.  Past Medical History  Diagnosis Date  . Anxiety   . Depression     attacks  . Arthritis   . Asthma     Michael Barr  [phone]  . Coronary artery disease   . Diabetes mellitus without complication     borderline     Past Surgical History  Procedure Laterality Date  . Coronary artery bypass graft      1996   x1  . Joint replacement      lt knee  . Cholecystectomy    . Shoulder arthroscopy      No family history on file. Social History:  reports that he has quit smoking. His smoking use included Cigarettes. He smoked 0.00 packs per day. He does not have any smokeless tobacco history on file. He reports that he does not drink alcohol or use illicit drugs.  Allergies:  Allergies  Allergen Reactions  . Penicillins Rash    Medications Prior to Admission  Medication Sig Dispense Refill  . acetaminophen (TYLENOL) 500 MG tablet Take 500 mg by mouth every 6 (six) hours as needed.      . ALPRAZolam (XANAX) 0.5 MG  tablet Take 0.5 mg by mouth 3 (three) times daily as needed for anxiety.      Marland Kitchen lisinopril-hydrochlorothiazide (PRINZIDE,ZESTORETIC) 20-25 MG per tablet Take 1 tablet by mouth daily.      . methadone (DOLOPHINE) 10 MG tablet Take 10 mg by mouth every 8 (eight) hours.      . Multiple Vitamins-Minerals (MULTIVITAMIN WITH MINERALS) tablet Take 1 tablet by mouth daily.      . Oxycodone HCl 10 MG TABS Take 10 mg by mouth 3 (three) times daily as needed (severe pain).      . Potassium (POTASSIMIN PO) Take 200 mg by mouth daily.      Marland Kitchen senna (SENOKOT) 8.6 MG TABS tablet Take 2 tablets by mouth at bedtime.      . simvastatin (ZOCOR) 40 MG tablet Take 40 mg by mouth at bedtime.      . traZODone (DESYREL) 100 MG tablet Take 250 mg by mouth at bedtime.      Marland Kitchen albuterol (PROVENTIL HFA;VENTOLIN HFA) 108 (90 BASE) MCG/ACT inhaler Inhale 1 puff into the lungs every 6 (six) hours as needed for wheezing or shortness of breath.      Marland Kitchen aspirin EC 325 MG tablet Take 325 mg by mouth daily.      . naproxen (NAPROSYN) 500 MG tablet Take 500 mg by mouth 2 (two) times daily with a  meal.        Results for orders placed during the hospital encounter of 09/26/13 (from the past 48 hour(s))  GLUCOSE, CAPILLARY     Status: Abnormal   Collection Time    09/26/13  8:23 AM      Result Value Range   Glucose-Capillary 124 (*) 70 - 99 mg/dL   Comment 1 Notify RN     No results found.  Review of Systems  Constitutional: Negative.   HENT: Negative.   Eyes: Negative.   Respiratory: Negative.   Cardiovascular: Negative.   Gastrointestinal: Negative.   Genitourinary: Negative.   Musculoskeletal: Positive for back pain and myalgias.  Skin: Negative.   Neurological: Positive for dizziness and tingling.    Blood pressure 136/52, pulse 73, temperature 97.7 F (36.5 C), temperature source Oral, resp. rate 20, SpO2 96.00%. Physical Exam  Constitutional: He is oriented to person, place, and time. He appears well-developed  and well-nourished.  HENT:  Head: Normocephalic.  Eyes: Conjunctivae are normal. Pupils are equal, round, and reactive to light.  Neck: Normal range of motion.  Respiratory: Effort normal.  GI: Soft.  Musculoskeletal: Normal range of motion.  Neurological: He is alert and oriented to person, place, and time. He has normal strength. GCS eye subscore is 4. GCS verbal subscore is 5. GCS motor subscore is 6.  Reflex Scores:      Patellar reflexes are 0 on the right side and 0 on the left side.      Achilles reflexes are 0 on the right side and 0 on the left side. Strength is 5 out of 5 in his iliopsoas quads and she's gastrocs EHL bilaterally  Skin: Skin is warm and dry.     Assessment/Plan 6 out of gentleman presents for a right L3-4 laminectomy microdiscectomy extraforaminal   Michael Barr 09/26/2013, 10:09 AM

## 2013-09-26 NOTE — Op Note (Signed)
Preoperative diagnosis: Right L3 radiculopathy from HNP L3-4 extraforaminal right  Postoperative diagnosis: Same  Procedure: Extraforaminal discectomy L3-4 on the right with microdissection of the right L3 nerve root  Surgeon: Jillyn Hidden Kaulder Zahner  Assistant: Barnett Abu  Anesthesia: Gen.  EBL: Minimal  History of present illness: Patient is a very pleasant 67 year old gentleman is a progress worsening right hip and anterior quad pain but last several months and years refractory to all forms of conservative treatment imaging showed external disc herniation at L3-4 the right as well as the right L3 nerve root as well as severe spondylosis causing severe stenosis in the extradural space on the L3 nerve root. Due to patient's failure of conservative treatment imaging findings and progression of clinical syndrome I recommended extra foraminal decompression discectomy on the right at L3-4 extensor reviewed the risks and benefits of the operation the patient as well as perioperative course and expectations of outcome alternatives of surgery and he understood and agreed to proceed forward.  Operative procedure: Patient brought into the or was induced under general anesthesia positioned prone the Wilson frame his back was prepped and draped in routine sterile fashion his old incision was opened up and subperiosteal dissections care lamina of L3 and L4 the right as well as intra-aspect of the lamina of L2. The L3 pedicle was identified and interoperative x-ray confirmed this to be accurate the lateral pars inferior to 3 facet and superior 3 for facet was drilled down a high-speed drill this was then under been with a 2 minute Kerrison punch identifying the inner transverse ligament there is a large spur coming off the inferior aspect of the 23 facet causing severe stenosis on top of the L3 nerve root this is all removed in piecemeal fashion and the L3 nerve root was identified and working inferiorly the L3 nerve root  the epidural veins are coagulated the disc spaces identified and was noted be free fragment still partially contained ligament displacing the L3 nerve up against the L3 pedicle. This was incised with a scalpel large several arthritis disc removed the spaces and cleanout pituitary rongeurs and Epstein curettes. At the discectomy there is no further stenosis the L3 foramen was explored both proximally and distally with a coronary dilator and noted to have no further stenosis. Was then copiously irrigated meticulous hemostasis was maintained no further fragments were appreciated the wounds closed after laying Gelfoam over the top of the L3 nerve with interrupted Vicryl and a running 4 subcuticular in the skin Dermabond benzo and Steri-Strips and OpSite were applied patient recovered in stable condition. At the end of case on it counts sponge counts were correct.

## 2013-09-26 NOTE — Anesthesia Preprocedure Evaluation (Addendum)
Anesthesia Evaluation  Patient identified by MRN, date of birth, ID band Patient awake    Reviewed: Allergy & Precautions, H&P , NPO status , Patient's Chart, lab work & pertinent test results  Airway Mallampati: III TM Distance: >3 FB Neck ROM: Full    Dental  (+) Dental Advisory Given and Teeth Intact   Pulmonary asthma , former smoker,          Cardiovascular hypertension, Pt. on medications + CAD and + CABG  09/18/13 Preop Visit:  Patient underwent CABG X 1 (questionable LIMA to LAD) for "widow maker" lesion.  His previous cardiologist retired, so now he is being followed by his Internist Dr. Tiburcio Pea.  His last stress test was > 5 years ago. Dr. Tiburcio Pea is aware of plans for surgery and saw patient on 09/04/13.  He did a preoperative CXR and EKG.  Patient was asymptomatic from a CAD standpoint and EKG was stable.   Neuro/Psych PSYCHIATRIC DISORDERS Anxiety Depression    GI/Hepatic   Endo/Other  diabetes, Type obesity  Renal/GU      Musculoskeletal  (+) Arthritis -,   Abdominal   Peds  Hematology   Anesthesia Other Findings   Reproductive/Obstetrics                       Anesthesia Physical Anesthesia Plan  ASA: III  Anesthesia Plan: General   Post-op Pain Management:    Induction: Intravenous  Airway Management Planned: Oral ETT  Additional Equipment:   Intra-op Plan:   Post-operative Plan: Extubation in OR  Informed Consent: I have reviewed the patients History and Physical, chart, labs and discussed the procedure including the risks, benefits and alternatives for the proposed anesthesia with the patient or authorized representative who has indicated his/her understanding and acceptance.   Dental advisory given  Plan Discussed with: CRNA, Anesthesiologist and Surgeon  Anesthesia Plan Comments:         Anesthesia Quick Evaluation

## 2013-09-26 NOTE — Anesthesia Postprocedure Evaluation (Signed)
  Anesthesia Post-op Note  Patient: Michael Barr  Procedure(s) Performed: Procedure(s): LUMBAR LAMINECTOMY/DECOMPRESSION MICRODISCECTOMY RIGHT THREE-FOUR (Right)  Patient Location: PACU  Anesthesia Type:General  Level of Consciousness: awake, alert , oriented and patient cooperative  Airway and Oxygen Therapy: Patient Spontanous Breathing  Post-op Pain: mild  Post-op Assessment: Post-op Vital signs reviewed, Patient's Cardiovascular Status Stable, Respiratory Function Stable, Patent Airway, No signs of Nausea or vomiting and Pain level controlled  Post-op Vital Signs: stable  Complications: No apparent anesthesia complications

## 2013-09-26 NOTE — Anesthesia Procedure Notes (Signed)
Procedure Name: Intubation Date/Time: 09/26/2013 10:17 AM Performed by: Vita Barley E Pre-anesthesia Checklist: Patient identified, Emergency Drugs available, Suction available and Patient being monitored Patient Re-evaluated:Patient Re-evaluated prior to inductionOxygen Delivery Method: Circle system utilized Preoxygenation: Pre-oxygenation with 100% oxygen Intubation Type: IV induction Ventilation: Two handed mask ventilation required, Oral airway inserted - appropriate to patient size and Mask ventilation without difficulty Laryngoscope Size: Miller and 2 Grade View: Grade II Tube type: Oral Tube size: 7.5 mm Number of attempts: 1 Airway Equipment and Method: Stylet and Oral airway Placement Confirmation: ETT inserted through vocal cords under direct vision,  positive ETCO2 and breath sounds checked- equal and bilateral Secured at: 23 cm Tube secured with: Tape Dental Injury: Teeth and Oropharynx as per pre-operative assessment

## 2013-09-27 MED ORDER — MORPHINE SULFATE 30 MG PO TABS: 30.0000 mg | ORAL_TABLET | ORAL | Status: DC | PRN

## 2013-09-27 NOTE — Progress Notes (Signed)
Patient ID: Michael Barr, male   DOB: 06/12/1946, 67 y.o.   MRN: [id number] Well preoperative leg pain completely resolved occasional episode more femoral neuropathy quad and anterior shin and foot but that was one time overall is doing much better. Plan discharge home

## 2013-09-27 NOTE — Discharge Summary (Signed)
  Physician Discharge Summary  Patient ID: Michael Barr MRN: [id number] DOB/AGE: 1946-06-23 67 y.o.  Admit date: 09/26/2013 Discharge date: 09/27/2013  Admission Diagnoses: Right L3 radiculopathy from herniated this pulposis L3-4 extraforaminal right  Discharge Diagnoses: Same Active Problems:   HNP (herniated nucleus pulposus), lumbar   Discharged Condition: good  Hospital Course: This is been hospital underwent an L3 for extra foraminal discectomy in the right patient did very well postoperatively with the floor was angling and voiding spontaneously tolerating regular diet was stable and be discharged home on morphine for pain.  Consults: Significant Diagnostic Studies: Treatments: L3-4 extraforaminal discectomy Discharge Exam: Blood pressure 138/72, pulse 84, temperature 98.4 F (36.9 C), temperature source Oral, resp. rate 18, SpO2 94.00%. Strength out of 5 wound clean and dry  Disposition: Home     Medication List         acetaminophen 500 MG tablet  Commonly known as:  TYLENOL  Take 500 mg by mouth every 6 (six) hours as needed.     albuterol 108 (90 BASE) MCG/ACT inhaler  Commonly known as:  PROVENTIL HFA;VENTOLIN HFA  Inhale 1 puff into the lungs every 6 (six) hours as needed for wheezing or shortness of breath.     ALPRAZolam 0.5 MG tablet  Commonly known as:  XANAX  Take 0.5 mg by mouth 3 (three) times daily as needed for anxiety.     aspirin EC 325 MG tablet  Take 325 mg by mouth daily.     lisinopril-hydrochlorothiazide 20-25 MG per tablet  Commonly known as:  PRINZIDE,ZESTORETIC  Take 1 tablet by mouth daily.     methadone 10 MG tablet  Commonly known as:  DOLOPHINE  Take 10 mg by mouth every 8 (eight) hours.     morphine 30 MG tablet  Commonly known as:  MSIR  Take 1 tablet (30 mg total) by mouth every 3 (three) hours as needed for moderate pain or severe pain.     multivitamin with minerals tablet  Take 1 tablet by mouth daily.     naproxen 500 MG tablet  Commonly known as:  NAPROSYN  Take 500 mg by mouth 2 (two) times daily with a meal.     Oxycodone HCl 10 MG Tabs  Take 10 mg by mouth 3 (three) times daily as needed (severe pain).     POTASSIMIN PO  Take 200 mg by mouth daily.     senna 8.6 MG Tabs tablet  Commonly known as:  SENOKOT  Take 2 tablets by mouth at bedtime.     simvastatin 40 MG tablet  Commonly known as:  ZOCOR  Take 40 mg by mouth at bedtime.     traZODone 100 MG tablet  Commonly known as:  DESYREL  Take 250 mg by mouth at bedtime.           Follow-up Information   Follow up with [facility] P, MD.   Specialty:  Neurosurgery   Contact information:   [address] [place] [address] [phone]       Signed: Sariya Trickey P 09/27/2013, 9:47 AM

## 2013-09-28 ENCOUNTER — Encounter (HOSPITAL_COMMUNITY): Payer: Self-pay | Admitting: Neurosurgery

## 2013-09-28 NOTE — Progress Notes (Signed)
  Pt. Alert and oriented,follows simple instructions, denies pain. Incision area without swelling, redness or S/S of infection. Voiding adequate clear yellow urine. Moving all extremities well and vitals stable and documented. Patient discharged home with spouse. Lumbar surgery notes instructions given to patient and family member for home safety and precautions. Pt. and family stated understanding of instructions given.  

## 2013-11-15 ENCOUNTER — Other Ambulatory Visit: Payer: Self-pay | Admitting: Neurosurgery

## 2013-11-15 DIAGNOSIS — M48061 Spinal stenosis, lumbar region without neurogenic claudication: Secondary | ICD-10-CM

## 2013-11-23 ENCOUNTER — Ambulatory Visit
Admission: RE | Admit: 2013-11-23 | Discharge: 2013-11-23 | Disposition: A | Discharge status: Home / Self Care | Payer: Medicare Other | Source: Ambulatory Visit | Attending: Neurosurgery | Admitting: Neurosurgery

## 2013-11-23 DIAGNOSIS — M48061 Spinal stenosis, lumbar region without neurogenic claudication: Secondary | ICD-10-CM

## 2013-11-23 MED ORDER — GADOBENATE DIMEGLUMINE 529 MG/ML IV SOLN
20.0000 mL | Freq: Once | INTRAVENOUS | Status: AC | PRN
  Administered 2013-11-23: 20 mL via INTRAVENOUS

## 2013-11-28 ENCOUNTER — Other Ambulatory Visit: Payer: Self-pay | Admitting: Neurosurgery

## 2013-11-28 DIAGNOSIS — M5126 Other intervertebral disc displacement, lumbar region: Secondary | ICD-10-CM

## 2013-11-29 ENCOUNTER — Other Ambulatory Visit: Payer: Self-pay | Admitting: Neurosurgery

## 2013-12-04 ENCOUNTER — Encounter (HOSPITAL_COMMUNITY): Payer: Self-pay | Admitting: Pharmacy Technician

## 2013-12-04 ENCOUNTER — Encounter (HOSPITAL_COMMUNITY): Payer: Self-pay

## 2013-12-04 ENCOUNTER — Ambulatory Visit
Admission: RE | Admit: 2013-12-04 | Discharge: 2013-12-04 | Disposition: A | Discharge status: Home / Self Care | Payer: Medicare Other | Source: Ambulatory Visit | Attending: Neurosurgery | Admitting: Neurosurgery

## 2013-12-04 ENCOUNTER — Encounter (HOSPITAL_COMMUNITY)
Admission: RE | Admit: 2013-12-04 | Discharge: 2013-12-04 | Disposition: A | Discharge status: Home / Self Care | Payer: Medicare Other | Source: Ambulatory Visit | Attending: Neurosurgery | Admitting: Neurosurgery

## 2013-12-04 DIAGNOSIS — M5126 Other intervertebral disc displacement, lumbar region: Secondary | ICD-10-CM

## 2013-12-04 DIAGNOSIS — Z01812 Encounter for preprocedural laboratory examination: Secondary | ICD-10-CM | POA: Insufficient documentation

## 2013-12-04 HISTORY — DX: Pneumonia, unspecified organism: J18.9

## 2013-12-04 HISTORY — DX: Nocturia: R35.1

## 2013-12-04 HISTORY — DX: Constipation, unspecified: K59.00

## 2013-12-04 HISTORY — DX: Weakness: R53.1

## 2013-12-04 HISTORY — DX: Unspecified cataract: H26.9

## 2013-12-04 HISTORY — DX: Other chronic pain: G89.29

## 2013-12-04 HISTORY — DX: Hypokalemia: E87.6

## 2013-12-04 HISTORY — DX: Personal history of urinary calculi: Z87.442

## 2013-12-04 HISTORY — DX: Effusion, unspecified joint: M25.40

## 2013-12-04 HISTORY — DX: Fibromyalgia: M79.7

## 2013-12-04 HISTORY — DX: Essential (primary) hypertension: I10

## 2013-12-04 HISTORY — DX: Pain in unspecified joint: M25.50

## 2013-12-04 HISTORY — DX: Insomnia, unspecified: G47.00

## 2013-12-04 HISTORY — DX: Hyperlipidemia, unspecified: E78.5

## 2013-12-04 HISTORY — DX: Dorsalgia, unspecified: M54.9

## 2013-12-04 LAB — BASIC METABOLIC PANEL
BUN: 25 mg/dL — AB (ref 6–23)
CHLORIDE: 103 meq/L (ref 96–112)
CO2: 24 mEq/L (ref 19–32)
Calcium: 9.3 mg/dL (ref 8.4–10.5)
Creatinine, Ser: 0.88 mg/dL (ref 0.50–1.35)
GFR calc Af Amer: >90 mL/min (ref >90)
GFR calc non Af Amer: 88 mL/min — ABNORMAL LOW (ref >90)
Glucose, Bld: 155 mg/dL — ABNORMAL HIGH (ref 70–99)
POTASSIUM: 4.7 meq/L (ref 3.7–5.3)
Sodium: 142 mEq/L (ref 137–147)

## 2013-12-04 LAB — SURGICAL PCR SCREEN
MRSA, PCR: NEGATIVE
Staphylococcus aureus: POSITIVE — AB

## 2013-12-04 LAB — TYPE AND SCREEN
ABO/RH(D): A POS
ANTIBODY SCREEN: NEGATIVE

## 2013-12-04 LAB — CBC
HEMATOCRIT: 40.9 % (ref 39.0–52.0)
HEMOGLOBIN: 13.7 g/dL (ref 13.0–17.0)
MCH: 29.6 pg (ref 26.0–34.0)
MCHC: 33.5 g/dL (ref 30.0–36.0)
MCV: 88.3 fL (ref 78.0–100.0)
Platelets: 214 10*3/uL (ref 150–400)
RBC: 4.63 MIL/uL (ref 4.22–5.81)
RDW: 14 % (ref 11.5–15.5)
WBC: 8.7 10*3/uL (ref 4.0–10.5)

## 2013-12-04 LAB — ABO/RH: ABO/RH(D): A POS

## 2013-12-04 NOTE — Progress Notes (Addendum)
Dr Quincy Simmonds at [facility] in [place] office visit in epic from Nov 2014 [phone]  Stress test done about 28yrs ago but doesn't even know where it was done at not on file at Dr.Sydney Harris's office  Pt doesn't have a cardiologist  EKG in epic from 09/04/13(under media tab in epic)  CXR report in epic from 09/04/13  Timoteo Ace is pain MD  [phone]  Echo to be requested from Dr.Sydney Harris no echo on file

## 2013-12-04 NOTE — Pre-Procedure Instructions (Signed)
Kurt Waren  12/04/2013   Your procedure is scheduled on:  Fri, Feb 20 @ 10:55 AM  Report to [facility]ance A  at 7:45 AM.  Call this number if you have problems the morning of surgery: [phone]   Remember:   Do not eat food or drink liquids after midnight.   Take these medicines the morning of surgery with A SIP OF WATER: Albuterol<Bring Your Inhaler With You>,Xanax(Alprazolam),and Pain Pill(if needed)              Stop taking your Aspirin and Naproxen. No Goody's,BC's,Ibuprofen,Fish Oil,or any Herbal Medications   Do not wear jewelry.  Do not wear lotions, powders, or colognes. You may wear deodorant.  Men may shave face and neck.  Do not bring valuables to the hospital.  [facility] is not responsible                  for any belongings or valuables.               Contacts, dentures or bridgework may not be worn into surgery.  Leave suitcase in the car. After surgery it may be brought to your room.  For patients admitted to the hospital, discharge time is determined by your                treatment team.                 Special Instructions:  [place] - Preparing for Surgery  Before surgery, you can play an important role.  Because skin is not sterile, your skin needs to be as free of germs as possible.  You can reduce the number of germs on you skin by washing with CHG (chlorahexidine gluconate) soap before surgery.  CHG is an antiseptic cleaner which kills germs and bonds with the skin to continue killing germs even after washing.  Please DO NOT use if you have an allergy to CHG or antibacterial soaps.  If your skin becomes reddened/irritated stop using the CHG and inform your nurse when you arrive at Short Stay.  Do not shave (including legs and underarms) for at least 48 hours prior to the first CHG shower.  You may shave your face.  Please follow these instructions carefully:   1.  Shower with CHG Soap the night before surgery and the                                 morning of Surgery.  2.  If you choose to wash your hair, wash your hair first as usual with your       normal shampoo.  3.  After you shampoo, rinse your hair and body thoroughly to remove the                      Shampoo.  4.  Use CHG as you would any other liquid soap.  You can apply chg directly       to the skin and wash gently with scrungie or a clean washcloth.  5.  Apply the CHG Soap to your body ONLY FROM THE NECK DOWN.        Do not use on open wounds or open sores.  Avoid contact with your eyes,       ears, mouth and genitals (private parts).  Wash genitals (private parts)  with your normal soap.  6.  Wash thoroughly, paying special attention to the area where your surgery        will be performed.  7.  Thoroughly rinse your body with warm water from the neck down.  8.  DO NOT shower/wash with your normal soap after using and rinsing off       the CHG Soap.  9.  Pat yourself dry with a clean towel.            10.  Wear clean pajamas.            11.  Place clean sheets on your bed the night of your first shower and do not        sleep with pets.  Day of Surgery  Do not apply any lotions/deoderants the morning of surgery.  Please wear clean clothes to the hospital/surgery center.     Please read over the following fact sheets that you were given: Pain Booklet, Coughing and Deep Breathing, Blood Transfusion Information, MRSA Information and Surgical Site Infection Prevention

## 2013-12-04 NOTE — Progress Notes (Signed)
Mupirocin script called into [facility] in [place]

## 2013-12-04 NOTE — Progress Notes (Signed)
12/04/13 1317  OBSTRUCTIVE SLEEP APNEA  Have you ever been diagnosed with sleep apnea through a sleep study? No  Do you snore loudly (loud enough to be heard through closed doors)?  0  Do you often feel tired, fatigued, or sleepy during the daytime? 0  Has anyone observed you stop breathing during your sleep? 0  Do you have, or are you being treated for high blood pressure? 1  BMI more than 35 kg/m2? 1  Age over 66 years old? 1  Neck circumference greater than 40 cm/18 inches? 1  Gender: 1  Obstructive Sleep Apnea Score 5  Score 4 or greater  Results sent to PCP

## 2013-12-13 MED ORDER — VANCOMYCIN HCL 10 G IV SOLR
1500.0000 mg | INTRAVENOUS | Status: AC
  Administered 2013-12-14: 1500 mg via INTRAVENOUS
  Filled 2013-12-13: qty 1500

## 2013-12-13 NOTE — Progress Notes (Signed)
Had to leave voice mail instructing patient of surgery time change.   Needs to be here at 0530.   DA

## 2013-12-14 ENCOUNTER — Encounter (HOSPITAL_COMMUNITY)
Admission: RE | Disposition: A | Discharge status: Home Health | Payer: Medicare Other | Source: Ambulatory Visit | Attending: Neurosurgery

## 2013-12-14 ENCOUNTER — Encounter (HOSPITAL_COMMUNITY): Payer: Medicare Other | Admitting: Anesthesiology

## 2013-12-14 ENCOUNTER — Inpatient Hospital Stay (HOSPITAL_COMMUNITY): Payer: Medicare Other | Admitting: Anesthesiology

## 2013-12-14 ENCOUNTER — Inpatient Hospital Stay (HOSPITAL_COMMUNITY)
Admission: RE | Admit: 2013-12-14 | Discharge: 2013-12-19 | DRG: 460 | Disposition: A | Discharge status: Home Health | Payer: Medicare Other | Source: Ambulatory Visit | Attending: Neurosurgery | Admitting: Neurosurgery

## 2013-12-14 ENCOUNTER — Encounter (HOSPITAL_COMMUNITY): Payer: Self-pay | Admitting: *Deleted

## 2013-12-14 ENCOUNTER — Inpatient Hospital Stay (HOSPITAL_COMMUNITY): Payer: Medicare Other

## 2013-12-14 DIAGNOSIS — M47817 Spondylosis without myelopathy or radiculopathy, lumbosacral region: Secondary | ICD-10-CM | POA: Diagnosis present

## 2013-12-14 DIAGNOSIS — M5137 Other intervertebral disc degeneration, lumbosacral region: Secondary | ICD-10-CM | POA: Diagnosis present

## 2013-12-14 DIAGNOSIS — Q762 Congenital spondylolisthesis: Secondary | ICD-10-CM

## 2013-12-14 DIAGNOSIS — M51379 Other intervertebral disc degeneration, lumbosacral region without mention of lumbar back pain or lower extremity pain: Secondary | ICD-10-CM | POA: Diagnosis present

## 2013-12-14 DIAGNOSIS — E785 Hyperlipidemia, unspecified: Secondary | ICD-10-CM | POA: Diagnosis present

## 2013-12-14 DIAGNOSIS — I10 Essential (primary) hypertension: Secondary | ICD-10-CM | POA: Diagnosis present

## 2013-12-14 DIAGNOSIS — F411 Generalized anxiety disorder: Secondary | ICD-10-CM | POA: Diagnosis present

## 2013-12-14 DIAGNOSIS — Z7982 Long term (current) use of aspirin: Secondary | ICD-10-CM

## 2013-12-14 DIAGNOSIS — IMO0001 Reserved for inherently not codable concepts without codable children: Secondary | ICD-10-CM | POA: Diagnosis present

## 2013-12-14 DIAGNOSIS — Z880 Allergy status to penicillin: Secondary | ICD-10-CM

## 2013-12-14 DIAGNOSIS — Z951 Presence of aortocoronary bypass graft: Secondary | ICD-10-CM

## 2013-12-14 DIAGNOSIS — Z87891 Personal history of nicotine dependence: Secondary | ICD-10-CM

## 2013-12-14 DIAGNOSIS — E119 Type 2 diabetes mellitus without complications: Secondary | ICD-10-CM | POA: Diagnosis present

## 2013-12-14 DIAGNOSIS — F1120 Opioid dependence, uncomplicated: Secondary | ICD-10-CM | POA: Diagnosis present

## 2013-12-14 DIAGNOSIS — Z96659 Presence of unspecified artificial knee joint: Secondary | ICD-10-CM

## 2013-12-14 DIAGNOSIS — M5126 Other intervertebral disc displacement, lumbar region: Principal | ICD-10-CM | POA: Diagnosis present

## 2013-12-14 DIAGNOSIS — M48061 Spinal stenosis, lumbar region without neurogenic claudication: Secondary | ICD-10-CM | POA: Diagnosis present

## 2013-12-14 DIAGNOSIS — I2510 Atherosclerotic heart disease of native coronary artery without angina pectoris: Secondary | ICD-10-CM | POA: Diagnosis present

## 2013-12-14 LAB — GLUCOSE, CAPILLARY
GLUCOSE-CAPILLARY: 109 mg/dL — AB (ref 70–99)
Glucose-Capillary: 166 mg/dL — ABNORMAL HIGH (ref 70–99)

## 2013-12-14 SURGERY — POSTERIOR LUMBAR FUSION 1 LEVEL
Anesthesia: General | Site: Back

## 2013-12-14 MED ORDER — HYDROMORPHONE HCL PF 1 MG/ML IJ SOLN: 0.5000 mg | INTRAMUSCULAR | Status: DC | PRN

## 2013-12-14 MED ORDER — SENNA 8.6 MG PO TABS
2.0000 | ORAL_TABLET | Freq: Every day | ORAL | Status: DC
  Administered 2013-12-14 – 2013-12-18 (×5): 17.2 mg via ORAL
  Filled 2013-12-14 (×6): qty 2

## 2013-12-14 MED ORDER — MIDAZOLAM HCL 2 MG/2ML IJ SOLN
1.0000 mg | Freq: Once | INTRAMUSCULAR | Status: AC
  Administered 2013-12-14: 1 mg via INTRAVENOUS

## 2013-12-14 MED ORDER — MIDAZOLAM HCL 2 MG/2ML IJ SOLN
1.0000 mg | Freq: Once | INTRAMUSCULAR | Status: AC
  Administered 2013-12-14: 0.5 mg via INTRAVENOUS

## 2013-12-14 MED ORDER — HYDROMORPHONE HCL PF 1 MG/ML IJ SOLN
INTRAMUSCULAR | Status: AC
Start: 2013-12-14 — End: 2013-12-15
  Filled 2013-12-14: qty 1

## 2013-12-14 MED ORDER — GLYCOPYRROLATE 0.2 MG/ML IJ SOLN
INTRAMUSCULAR | Status: DC | PRN
  Administered 2013-12-14: 1 mg via INTRAVENOUS

## 2013-12-14 MED ORDER — NEOSTIGMINE METHYLSULFATE 1 MG/ML IJ SOLN
INTRAMUSCULAR | Status: AC
  Filled 2013-12-14: qty 10

## 2013-12-14 MED ORDER — BUPIVACAINE HCL (PF) 0.25 % IJ SOLN
INTRAMUSCULAR | Status: DC | PRN
  Administered 2013-12-14: 10 mL

## 2013-12-14 MED ORDER — ROCURONIUM BROMIDE 100 MG/10ML IV SOLN
INTRAVENOUS | Status: DC | PRN
  Administered 2013-12-14: 20 mg via INTRAVENOUS
  Administered 2013-12-14: 80 mg via INTRAVENOUS

## 2013-12-14 MED ORDER — HYDROMORPHONE 0.3 MG/ML IV SOLN
INTRAVENOUS | Status: DC
  Administered 2013-12-14: 2.7 mg via INTRAVENOUS
  Administered 2013-12-14: 4.1 mg via INTRAVENOUS
  Administered 2013-12-15: 1.2 mg via INTRAVENOUS
  Administered 2013-12-15: 1.8 mg via INTRAVENOUS
  Administered 2013-12-15: 2.3 mg via INTRAVENOUS
  Administered 2013-12-15: 1.8 mg via INTRAVENOUS
  Administered 2013-12-15: 2.7 mg via INTRAVENOUS
  Administered 2013-12-15: 0.6 mg via INTRAVENOUS
  Administered 2013-12-15: 3.75 mg via INTRAVENOUS
  Administered 2013-12-15: 1.8 mg via INTRAVENOUS
  Administered 2013-12-16: 2.7 mg via INTRAVENOUS
  Administered 2013-12-16: 2.9 mg via INTRAVENOUS
  Filled 2013-12-14 (×4): qty 25

## 2013-12-14 MED ORDER — TRAZODONE HCL 150 MG PO TABS
250.0000 mg | ORAL_TABLET | Freq: Every day | ORAL | Status: DC
  Administered 2013-12-14 – 2013-12-18 (×5): 250 mg via ORAL
  Filled 2013-12-14 (×6): qty 1

## 2013-12-14 MED ORDER — LISINOPRIL-HYDROCHLOROTHIAZIDE 20-25 MG PO TABS: 1.0000 | ORAL_TABLET | Freq: Every morning | ORAL | Status: DC

## 2013-12-14 MED ORDER — HYDROCHLOROTHIAZIDE 25 MG PO TABS
25.0000 mg | ORAL_TABLET | Freq: Every day | ORAL | Status: DC
  Administered 2013-12-14 – 2013-12-19 (×6): 25 mg via ORAL
  Filled 2013-12-14 (×6): qty 1

## 2013-12-14 MED ORDER — ALUM & MAG HYDROXIDE-SIMETH 200-200-20 MG/5ML PO SUSP: 30.0000 mL | Freq: Four times a day (QID) | ORAL | Status: DC | PRN

## 2013-12-14 MED ORDER — GLYCOPYRROLATE 0.2 MG/ML IJ SOLN
INTRAMUSCULAR | Status: AC
  Filled 2013-12-14: qty 1

## 2013-12-14 MED ORDER — MIDAZOLAM HCL 5 MG/5ML IJ SOLN
INTRAMUSCULAR | Status: DC | PRN
  Administered 2013-12-14: 2 mg via INTRAVENOUS

## 2013-12-14 MED ORDER — DEXAMETHASONE SODIUM PHOSPHATE 10 MG/ML IJ SOLN
10.0000 mg | INTRAMUSCULAR | Status: AC
  Administered 2013-12-14: 10 mg via INTRAVENOUS
  Filled 2013-12-14: qty 1

## 2013-12-14 MED ORDER — ACETAMINOPHEN 650 MG RE SUPP: 650.0000 mg | RECTAL | Status: DC | PRN

## 2013-12-14 MED ORDER — ARTIFICIAL TEARS OP OINT
TOPICAL_OINTMENT | OPHTHALMIC | Status: AC
  Filled 2013-12-14: qty 3.5

## 2013-12-14 MED ORDER — DIPHENHYDRAMINE HCL 12.5 MG/5ML PO ELIX
12.5000 mg | ORAL_SOLUTION | Freq: Four times a day (QID) | ORAL | Status: DC | PRN
  Filled 2013-12-14: qty 5

## 2013-12-14 MED ORDER — SODIUM CHLORIDE 0.9 % IR SOLN
Status: DC | PRN
  Administered 2013-12-14 (×2)

## 2013-12-14 MED ORDER — ARTIFICIAL TEARS OP OINT
TOPICAL_OINTMENT | OPHTHALMIC | Status: DC | PRN
  Administered 2013-12-14: 1 via OPHTHALMIC

## 2013-12-14 MED ORDER — ONDANSETRON HCL 4 MG/2ML IJ SOLN: 4.0000 mg | INTRAMUSCULAR | Status: DC | PRN

## 2013-12-14 MED ORDER — DIPHENHYDRAMINE HCL 50 MG/ML IJ SOLN
12.5000 mg | Freq: Four times a day (QID) | INTRAMUSCULAR | Status: DC | PRN
  Filled 2013-12-14: qty 0.25

## 2013-12-14 MED ORDER — ACETAMINOPHEN 325 MG PO TABS: 650.0000 mg | ORAL_TABLET | ORAL | Status: DC | PRN

## 2013-12-14 MED ORDER — PROPOFOL 10 MG/ML IV BOLUS
INTRAVENOUS | Status: DC | PRN
  Administered 2013-12-14: 200 mg via INTRAVENOUS

## 2013-12-14 MED ORDER — MIDAZOLAM HCL 2 MG/2ML IJ SOLN
INTRAMUSCULAR | Status: AC
  Filled 2013-12-14: qty 2

## 2013-12-14 MED ORDER — NAPROXEN 500 MG PO TABS
500.0000 mg | ORAL_TABLET | Freq: Two times a day (BID) | ORAL | Status: DC
  Administered 2013-12-14 – 2013-12-19 (×9): 500 mg via ORAL
  Filled 2013-12-14 (×13): qty 1

## 2013-12-14 MED ORDER — ROCURONIUM BROMIDE 50 MG/5ML IV SOLN
INTRAVENOUS | Status: AC
  Filled 2013-12-14: qty 2

## 2013-12-14 MED ORDER — LIDOCAINE HCL (CARDIAC) 20 MG/ML IV SOLN
INTRAVENOUS | Status: DC | PRN
  Administered 2013-12-14: 80 mg via INTRAVENOUS

## 2013-12-14 MED ORDER — SODIUM CHLORIDE 0.9 % IV SOLN
10.0000 mg | INTRAVENOUS | Status: DC | PRN
  Administered 2013-12-14: 50 ug/min via INTRAVENOUS

## 2013-12-14 MED ORDER — VANCOMYCIN HCL 10 G IV SOLR
1500.0000 mg | Freq: Two times a day (BID) | INTRAVENOUS | Status: DC
  Administered 2013-12-14 – 2013-12-16 (×4): 1500 mg via INTRAVENOUS
  Filled 2013-12-14 (×5): qty 1500

## 2013-12-14 MED ORDER — NEOSTIGMINE METHYLSULFATE 1 MG/ML IJ SOLN
INTRAMUSCULAR | Status: DC | PRN
  Administered 2013-12-14: 5 mg via INTRAVENOUS

## 2013-12-14 MED ORDER — LIDOCAINE HCL (CARDIAC) 20 MG/ML IV SOLN
INTRAVENOUS | Status: AC
  Filled 2013-12-14: qty 5

## 2013-12-14 MED ORDER — METHADONE HCL 5 MG PO TABS
10.0000 mg | ORAL_TABLET | Freq: Three times a day (TID) | ORAL | Status: DC | PRN
  Administered 2013-12-16: 10 mg via ORAL
  Filled 2013-12-14: qty 2

## 2013-12-14 MED ORDER — PROPOFOL 10 MG/ML IV BOLUS
INTRAVENOUS | Status: AC
  Filled 2013-12-14: qty 20

## 2013-12-14 MED ORDER — PHENYLEPHRINE 40 MCG/ML (10ML) SYRINGE FOR IV PUSH (FOR BLOOD PRESSURE SUPPORT)
PREFILLED_SYRINGE | INTRAVENOUS | Status: AC
  Filled 2013-12-14: qty 20

## 2013-12-14 MED ORDER — PROMETHAZINE HCL 25 MG/ML IJ SOLN: 6.2500 mg | INTRAMUSCULAR | Status: DC | PRN

## 2013-12-14 MED ORDER — CYCLOBENZAPRINE HCL 10 MG PO TABS
ORAL_TABLET | ORAL | Status: AC
  Filled 2013-12-14: qty 1

## 2013-12-14 MED ORDER — SUCCINYLCHOLINE CHLORIDE 20 MG/ML IJ SOLN
INTRAMUSCULAR | Status: AC
  Filled 2013-12-14: qty 1

## 2013-12-14 MED ORDER — SODIUM CHLORIDE 0.9 % IJ SOLN
3.0000 mL | Freq: Two times a day (BID) | INTRAMUSCULAR | Status: DC
  Administered 2013-12-15 – 2013-12-18 (×6): 3 mL via INTRAVENOUS

## 2013-12-14 MED ORDER — OXYCODONE HCL 5 MG/5ML PO SOLN: 5.0000 mg | Freq: Once | ORAL | Status: DC | PRN

## 2013-12-14 MED ORDER — LIDOCAINE-EPINEPHRINE 1 %-1:100000 IJ SOLN
INTRAMUSCULAR | Status: DC | PRN
  Administered 2013-12-14: 10 mL

## 2013-12-14 MED ORDER — OXYCODONE HCL 5 MG PO TABS
10.0000 mg | ORAL_TABLET | Freq: Three times a day (TID) | ORAL | Status: DC | PRN
  Administered 2013-12-19: 10 mg via ORAL
  Filled 2013-12-14 (×2): qty 2

## 2013-12-14 MED ORDER — ALBUTEROL SULFATE (2.5 MG/3ML) 0.083% IN NEBU: 3.0000 mL | INHALATION_SOLUTION | Freq: Four times a day (QID) | RESPIRATORY_TRACT | Status: DC | PRN

## 2013-12-14 MED ORDER — OXYCODONE HCL 5 MG PO TABS: 5.0000 mg | ORAL_TABLET | Freq: Once | ORAL | Status: DC | PRN

## 2013-12-14 MED ORDER — GLYCOPYRROLATE 0.2 MG/ML IJ SOLN
INTRAMUSCULAR | Status: AC
  Filled 2013-12-14: qty 5

## 2013-12-14 MED ORDER — OXYCODONE-ACETAMINOPHEN 5-325 MG PO TABS: 1.0000 | ORAL_TABLET | ORAL | Status: DC | PRN

## 2013-12-14 MED ORDER — HYDROMORPHONE HCL PF 1 MG/ML IJ SOLN
0.2500 mg | INTRAMUSCULAR | Status: DC | PRN
  Administered 2013-12-14 (×4): 0.5 mg via INTRAVENOUS

## 2013-12-14 MED ORDER — ALPRAZOLAM 0.5 MG PO TABS
0.5000 mg | ORAL_TABLET | Freq: Three times a day (TID) | ORAL | Status: DC | PRN
  Administered 2013-12-14 – 2013-12-18 (×7): 0.5 mg via ORAL
  Filled 2013-12-14 (×8): qty 1

## 2013-12-14 MED ORDER — LACTATED RINGERS IV SOLN
INTRAVENOUS | Status: DC | PRN
  Administered 2013-12-14 (×2): via INTRAVENOUS

## 2013-12-14 MED ORDER — PHENOL 1.4 % MT LIQD: 1.0000 | OROMUCOSAL | Status: DC | PRN

## 2013-12-14 MED ORDER — DOCUSATE SODIUM 100 MG PO CAPS
100.0000 mg | ORAL_CAPSULE | Freq: Two times a day (BID) | ORAL | Status: DC
  Administered 2013-12-14 – 2013-12-19 (×10): 100 mg via ORAL
  Filled 2013-12-14 (×7): qty 1

## 2013-12-14 MED ORDER — SODIUM CHLORIDE 0.9 % IJ SOLN: 9.0000 mL | INTRAMUSCULAR | Status: DC | PRN

## 2013-12-14 MED ORDER — HYDROMORPHONE 0.3 MG/ML IV SOLN
INTRAVENOUS | Status: AC
  Administered 2013-12-14: 14:00:00
  Filled 2013-12-14: qty 25

## 2013-12-14 MED ORDER — MORPHINE SULFATE 15 MG PO TABS
30.0000 mg | ORAL_TABLET | ORAL | Status: DC | PRN
  Administered 2013-12-16 – 2013-12-19 (×13): 30 mg via ORAL
  Filled 2013-12-14 (×13): qty 2

## 2013-12-14 MED ORDER — FENTANYL CITRATE 0.05 MG/ML IJ SOLN
INTRAMUSCULAR | Status: AC
  Filled 2013-12-14: qty 5

## 2013-12-14 MED ORDER — ASPIRIN EC 325 MG PO TBEC
325.0000 mg | DELAYED_RELEASE_TABLET | Freq: Every day | ORAL | Status: DC
  Administered 2013-12-14 – 2013-12-19 (×6): 325 mg via ORAL
  Filled 2013-12-14 (×6): qty 1

## 2013-12-14 MED ORDER — ONDANSETRON HCL 4 MG/2ML IJ SOLN
INTRAMUSCULAR | Status: DC | PRN
  Administered 2013-12-14: 4 mg via INTRAVENOUS

## 2013-12-14 MED ORDER — SUCCINYLCHOLINE CHLORIDE 20 MG/ML IJ SOLN
INTRAMUSCULAR | Status: DC | PRN
  Administered 2013-12-14: 140 mg via INTRAVENOUS

## 2013-12-14 MED ORDER — CYCLOBENZAPRINE HCL 10 MG PO TABS
10.0000 mg | ORAL_TABLET | Freq: Three times a day (TID) | ORAL | Status: DC | PRN
  Administered 2013-12-14 – 2013-12-18 (×3): 10 mg via ORAL
  Filled 2013-12-14 (×3): qty 1

## 2013-12-14 MED ORDER — THROMBIN 20000 UNITS EX SOLR
CUTANEOUS | Status: DC | PRN
  Administered 2013-12-14 (×2): via TOPICAL

## 2013-12-14 MED ORDER — SODIUM CHLORIDE 0.9 % IV SOLN: 250.0000 mL | INTRAVENOUS | Status: DC

## 2013-12-14 MED ORDER — ACETAMINOPHEN 500 MG PO TABS: 500.0000 mg | ORAL_TABLET | Freq: Every day | ORAL | Status: DC | PRN

## 2013-12-14 MED ORDER — PHENYLEPHRINE HCL 10 MG/ML IJ SOLN
INTRAMUSCULAR | Status: AC
  Filled 2013-12-14: qty 1

## 2013-12-14 MED ORDER — FENTANYL CITRATE 0.05 MG/ML IJ SOLN
INTRAMUSCULAR | Status: DC | PRN
  Administered 2013-12-14 (×3): 50 ug via INTRAVENOUS
  Administered 2013-12-14: 150 ug via INTRAVENOUS

## 2013-12-14 MED ORDER — ALBUMIN HUMAN 5 % IV SOLN
INTRAVENOUS | Status: DC | PRN
  Administered 2013-12-14: 10:00:00 via INTRAVENOUS

## 2013-12-14 MED ORDER — 0.9 % SODIUM CHLORIDE (POUR BTL) OPTIME
TOPICAL | Status: DC | PRN
  Administered 2013-12-14: 1000 mL

## 2013-12-14 MED ORDER — ONDANSETRON HCL 4 MG/2ML IJ SOLN
INTRAMUSCULAR | Status: AC
  Filled 2013-12-14: qty 2

## 2013-12-14 MED ORDER — LISINOPRIL 20 MG PO TABS
20.0000 mg | ORAL_TABLET | Freq: Every day | ORAL | Status: DC
  Administered 2013-12-14 – 2013-12-19 (×6): 20 mg via ORAL
  Filled 2013-12-14 (×6): qty 1

## 2013-12-14 MED ORDER — ONDANSETRON HCL 4 MG/2ML IJ SOLN
4.0000 mg | Freq: Four times a day (QID) | INTRAMUSCULAR | Status: DC | PRN
  Filled 2013-12-14: qty 2

## 2013-12-14 MED ORDER — MENTHOL 3 MG MT LOZG: 1.0000 | LOZENGE | OROMUCOSAL | Status: DC | PRN

## 2013-12-14 MED ORDER — SIMVASTATIN 40 MG PO TABS
40.0000 mg | ORAL_TABLET | Freq: Every day | ORAL | Status: DC
  Administered 2013-12-14 – 2013-12-18 (×5): 40 mg via ORAL
  Filled 2013-12-14 (×6): qty 1

## 2013-12-14 MED ORDER — HYDROMORPHONE HCL PF 1 MG/ML IJ SOLN
INTRAMUSCULAR | Status: AC
  Filled 2013-12-14: qty 1

## 2013-12-14 MED ORDER — PHENYLEPHRINE HCL 10 MG/ML IJ SOLN
INTRAMUSCULAR | Status: DC | PRN
  Administered 2013-12-14: 160 ug via INTRAVENOUS
  Administered 2013-12-14 (×2): 80 ug via INTRAVENOUS
  Administered 2013-12-14: 120 ug via INTRAVENOUS
  Administered 2013-12-14 (×4): 80 ug via INTRAVENOUS

## 2013-12-14 MED ORDER — SODIUM CHLORIDE 0.9 % IJ SOLN: 3.0000 mL | INTRAMUSCULAR | Status: DC | PRN

## 2013-12-14 MED ORDER — NALOXONE HCL 0.4 MG/ML IJ SOLN
0.4000 mg | INTRAMUSCULAR | Status: DC | PRN
  Filled 2013-12-14: qty 1

## 2013-12-14 SURGICAL SUPPLY — 79 items
BAG DECANTER FOR FLEXI CONT (MISCELLANEOUS) ×6 IMPLANT
BENZOIN TINCTURE PRP APPL 2/3 (GAUZE/BANDAGES/DRESSINGS) ×3 IMPLANT
BLADE SURG 11 STRL SS (BLADE) ×3 IMPLANT
BLADE SURG ROTATE 9660 (MISCELLANEOUS) ×6 IMPLANT
BRUSH SCRUB EZ PLAIN DRY (MISCELLANEOUS) ×3 IMPLANT
BUR MATCHSTICK NEURO 3.0 LAGG (BURR) ×3 IMPLANT
BUR PRECISION FLUTE 6.0 (BURR) ×3 IMPLANT
CAGE RISE 11-17-15 10X22 (Cage) ×6 IMPLANT
CAGE RISE 11-17-15 10X26 (Cage) ×6 IMPLANT
CANISTER SUCT 3000ML (MISCELLANEOUS) ×3 IMPLANT
CAP LOCKING THREADED (Cap) ×18 IMPLANT
CLOSURE WOUND 1/2 X4 (GAUZE/BANDAGES/DRESSINGS) ×1
CONT SPEC 4OZ CLIKSEAL STRL BL (MISCELLANEOUS) ×6 IMPLANT
COVER BACK TABLE 24X17X13 BIG (DRAPES) IMPLANT
COVER TABLE BACK 60X90 (DRAPES) ×3 IMPLANT
CROSSLINK SPINAL FUSION (Cage) ×3 IMPLANT
DECANTER SPIKE VIAL GLASS SM (MISCELLANEOUS) ×3 IMPLANT
DERMABOND ADHESIVE PROPEN (GAUZE/BANDAGES/DRESSINGS) ×2
DERMABOND ADVANCED (GAUZE/BANDAGES/DRESSINGS)
DERMABOND ADVANCED .7 DNX12 (GAUZE/BANDAGES/DRESSINGS) IMPLANT
DERMABOND ADVANCED .7 DNX6 (GAUZE/BANDAGES/DRESSINGS) ×1 IMPLANT
DRAPE C-ARM 42X72 X-RAY (DRAPES) ×6 IMPLANT
DRAPE LAPAROTOMY 100X72X124 (DRAPES) ×3 IMPLANT
DRAPE POUCH INSTRU U-SHP 10X18 (DRAPES) ×3 IMPLANT
DRAPE PROXIMA HALF (DRAPES) IMPLANT
DRAPE SURG 17X23 STRL (DRAPES) ×3 IMPLANT
DRSG OPSITE 4X5.5 SM (GAUZE/BANDAGES/DRESSINGS) ×6 IMPLANT
DRSG OPSITE POSTOP 4X8 (GAUZE/BANDAGES/DRESSINGS) ×3 IMPLANT
DURAPREP 26ML APPLICATOR (WOUND CARE) ×3 IMPLANT
ELECT BLADE 4.0 EZ CLEAN MEGAD (MISCELLANEOUS) ×3
ELECT REM PT RETURN 9FT ADLT (ELECTROSURGICAL) ×3
ELECTRODE BLDE 4.0 EZ CLN MEGD (MISCELLANEOUS) ×1 IMPLANT
ELECTRODE REM PT RTRN 9FT ADLT (ELECTROSURGICAL) ×1 IMPLANT
EVACUATOR 3/16  PVC DRAIN (DRAIN) ×2
EVACUATOR 3/16 PVC DRAIN (DRAIN) ×1 IMPLANT
GAUZE SPONGE 4X4 16PLY XRAY LF (GAUZE/BANDAGES/DRESSINGS) IMPLANT
GLOVE BIO SURGEON STRL SZ8 (GLOVE) ×6 IMPLANT
GLOVE BIOGEL M 8.0 STRL (GLOVE) ×3 IMPLANT
GLOVE BIOGEL PI IND STRL 7.0 (GLOVE) ×3 IMPLANT
GLOVE BIOGEL PI INDICATOR 7.0 (GLOVE) ×6
GLOVE ECLIPSE 7.5 STRL STRAW (GLOVE) IMPLANT
GLOVE EXAM NITRILE LRG STRL (GLOVE) ×9 IMPLANT
GLOVE EXAM NITRILE MD LF STRL (GLOVE) IMPLANT
GLOVE EXAM NITRILE XL STR (GLOVE) IMPLANT
GLOVE EXAM NITRILE XS STR PU (GLOVE) IMPLANT
GLOVE INDICATOR 8.5 STRL (GLOVE) ×12 IMPLANT
GLOVE SS BIOGEL STRL SZ 6.5 (GLOVE) ×8 IMPLANT
GLOVE SUPERSENSE BIOGEL SZ 6.5 (GLOVE) ×16
GOWN BRE IMP SLV AUR LG STRL (GOWN DISPOSABLE) ×3 IMPLANT
GOWN BRE IMP SLV AUR XL STRL (GOWN DISPOSABLE) ×6 IMPLANT
GOWN STRL REIN 2XL LVL4 (GOWN DISPOSABLE) IMPLANT
KIT BASIN OR (CUSTOM PROCEDURE TRAY) ×3 IMPLANT
KIT INFUSE XX SMALL 0.7CC (Orthopedic Implant) ×3 IMPLANT
KIT ROOM TURNOVER OR (KITS) ×3 IMPLANT
MIX DBX 10CC 35% BONE (Bone Implant) ×3 IMPLANT
NEEDLE HYPO 25X1 1.5 SAFETY (NEEDLE) ×3 IMPLANT
NS IRRIG 1000ML POUR BTL (IV SOLUTION) ×3 IMPLANT
PACK LAMINECTOMY NEURO (CUSTOM PROCEDURE TRAY) ×3 IMPLANT
PAD ARMBOARD 7.5X6 YLW CONV (MISCELLANEOUS) ×21 IMPLANT
PATTIES SURGICAL 1X1 (DISPOSABLE) ×3 IMPLANT
ROD 65MM SPINAL (Rod) ×4 IMPLANT
ROD SPNL 5.5 CREO TI 65 (Rod) ×2 IMPLANT
SCREW AMP MODULAR CREO 6.5X45 (Screw) ×12 IMPLANT
SCREW CREO 6.5X40 (Screw) ×6 IMPLANT
SCREW PA THRD CREO TULIP 5.5X4 (Head) ×18 IMPLANT
SPONGE GAUZE 4X4 12PLY (GAUZE/BANDAGES/DRESSINGS) ×3 IMPLANT
SPONGE LAP 4X18 X RAY DECT (DISPOSABLE) IMPLANT
SPONGE SURGIFOAM ABS GEL 100 (HEMOSTASIS) ×6 IMPLANT
STRIP CLOSURE SKIN 1/2X4 (GAUZE/BANDAGES/DRESSINGS) ×2 IMPLANT
SUT VIC AB 0 CT1 18XCR BRD8 (SUTURE) ×1 IMPLANT
SUT VIC AB 0 CT1 8-18 (SUTURE) ×2
SUT VIC AB 2-0 CT1 18 (SUTURE) ×3 IMPLANT
SUT VICRYL 4-0 PS2 18IN ABS (SUTURE) ×3 IMPLANT
SYR 20ML ECCENTRIC (SYRINGE) ×3 IMPLANT
TOWEL OR 17X24 6PK STRL BLUE (TOWEL DISPOSABLE) ×3 IMPLANT
TOWEL OR 17X26 10 PK STRL BLUE (TOWEL DISPOSABLE) ×3 IMPLANT
TRAY FOLEY CATH 14FRSI W/METER (CATHETERS) IMPLANT
TRAY FOLEY CATH 16FRSI W/METER (SET/KITS/TRAYS/PACK) ×3 IMPLANT
WATER STERILE IRR 1000ML POUR (IV SOLUTION) ×3 IMPLANT

## 2013-12-14 NOTE — Progress Notes (Signed)
   CARE MANAGEMENT NOTE 12/14/2013  Patient:  Michael Barr, Michael Barr   Account Number:  [id number]  Date Initiated:  12/14/2013  Documentation initiated by:  Olga Coaster  Subjective/Objective Assessment:   ADMITTED FOR BACK SURGERY     Action/Plan:   CM FOLLOWING FOR DCP   Anticipated DC Date:  12/21/2013   Anticipated DC Plan:  AWAITING PT/OT EVALS FOR DISPOSTION NEEDS WITH MD APPROVAL     DC Planning Services  CM consult         Status of service:  In process, will continue to follow Medicare Important Message given?  NA - LOS <3 / Initial given by admissions (If response is "NO", the following Medicare IM given date fields will be blank)  Per UR Regulation:  Reviewed for med. necessity/level of care/duration of stay  Comments:  2/20/2015Mindi Slicker RN,BSN,MHA [phone]

## 2013-12-14 NOTE — Preoperative (Signed)
Beta Blockers   Reason not to administer Beta Blockers:Not Applicable 

## 2013-12-14 NOTE — H&P (Signed)
Michael Barr is an 68 y.o. male.   Chief Complaint: Back and right leg pain HPI: Patient is a very pleasant 68 year old gentleman has had long-standing back and probably right L3 and L4 radiculopathies. He is undergone 2 previous laminectomies as well as extraforaminal discectomy at L3-4. Postoperatively after each operation the patient did significant better however after the most recent extraforaminal discectomy within several weeks he had a large recurrence of his back and leg pain and workup revealed a large recurrent disc herniation at L3-4 on the right do that this is the patient's third disc herniation or second recurrence there I feel that this is now representing evidence of instability so workup with both MRI scan CT scan and lumbar flexion extension films showed instability L3-4 and L4-5 with a grade 1 spondylolisthesis at both levels. And due to patient's failure of conservative treatment imaging findings and progression of clinical syndrome I recommended redo decompressive laminectomy L3-4 as well as a decompressive limiting L4-5 and posterior lumbar interbody fusion at both levels. I extensively reviewed the risks and benefits of the operation the patient as well as perioperative course expectations about alternatives of surgery he understands and agrees to proceed forward.  Past Medical History  Diagnosis Date  . Depression     attacks  . Arthritis   . Coronary artery disease   . Hypertension     takes Zestoretic daily  . Hyperlipidemia     takes Zocor daily  . Asthma 70's  . Pneumonia 70's  . Weakness     right leg  . Fibromyalgia   . Joint pain   . Joint swelling   . Chronic back pain     HNP  . Constipation     takes Sennokot nightly  . Nocturia   . History of kidney stones   . Diabetes mellitus without complication     borderline   . Hypokalemia     takes Potassium daily  . Cataract     immature and to the left eye  . Anxiety     takes Xanax and Methadone  daily  . Insomnia     takes trazodone nightly    Past Surgical History  Procedure Laterality Date  . Shoulder arthroscopy Bilateral     left x 2 and right x 1  . Lumbar laminectomy/decompression microdiscectomy Right 09/26/2013    Procedure: LUMBAR LAMINECTOMY/DECOMPRESSION MICRODISCECTOMY RIGHT THREE-FOUR;  Surgeon: Elaina Hoops, MD;  Location: [facility];  Service: Neurosurgery;  Laterality: Right;  . Coronary artery bypass graft  1996    1 vessel  . Joint replacement  1999    lt knee  . Cholecystectomy  1985  . Carpal tunnel release Bilateral 70's  . 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 Tonsillectomy    . Knee arthroscopy Right   . Pilondial cyst      x 2   . Colonoscopy      History reviewed. No pertinent family history. Social History:  reports that he has quit smoking. He does not have any smokeless tobacco history on file. He reports that he does not drink alcohol or use illicit drugs.  Allergies:  Allergies  Allergen Reactions  . Penicillins Rash    Medications Prior to Admission  Medication Sig Dispense Refill  . acetaminophen (TYLENOL) 500 MG tablet Take 500 mg by mouth daily as needed for mild pain.       Marland Kitchen ALPRAZolam (XANAX) 0.5 MG tablet Take 0.5 mg by  mouth 3 (three) times daily as needed for anxiety.      Marland Kitchen aspirin EC 325 MG tablet Take 325 mg by mouth daily.      Marland Kitchen lisinopril-hydrochlorothiazide (PRINZIDE,ZESTORETIC) 20-25 MG per tablet Take 1 tablet by mouth every morning.       . methadone (DOLOPHINE) 10 MG tablet Take 10 mg by mouth every 8 (eight) hours as needed (for anxiety and pain).       Marland Kitchen morphine (MSIR) 30 MG tablet Take 1 tablet (30 mg total) by mouth every 3 (three) hours as needed for moderate pain or severe pain.  80 tablet  0  . Multiple Vitamins-Minerals (MULTIVITAMIN WITH MINERALS) tablet Take 1 tablet by mouth daily.      . naproxen (NAPROSYN) 500 MG tablet Take 500 mg by mouth 2 (two) times daily with a meal.      . Oxycodone HCl 10  MG TABS Take 10 mg by mouth 3 (three) times daily as needed (severe pain).      Marland Kitchen senna (SENOKOT) 8.6 MG TABS tablet Take 2 tablets by mouth at bedtime.      . simvastatin (ZOCOR) 40 MG tablet Take 40 mg by mouth at bedtime.      . traZODone (DESYREL) 100 MG tablet Take 250 mg by mouth at bedtime.      Marland Kitchen albuterol (PROVENTIL HFA;VENTOLIN HFA) 108 (90 BASE) MCG/ACT inhaler Inhale 1 puff into the lungs every 6 (six) hours as needed for wheezing or shortness of breath.      Marland Kitchen OVER THE COUNTER MEDICATION Take 100 mg by mouth every morning. Take 200 mg in the morning        Results for orders placed during the hospital encounter of 12/14/13 (from the past 48 hour(s))  GLUCOSE, CAPILLARY     Status: Abnormal   Collection Time    12/14/13  6:07 AM      Result Value Ref Range   Glucose-Capillary 109 (*) 70 - 99 mg/dL   No results found.  Review of Systems  Constitutional: Negative.   HENT: Negative.   Eyes: Negative.   Respiratory: Negative.   Cardiovascular: Negative.   Gastrointestinal: Negative.   Genitourinary: Negative.   Musculoskeletal: Positive for back pain, joint pain and myalgias.  Skin: Negative.   Neurological: Positive for tingling and sensory change.  Endo/Heme/Allergies: Negative.   Psychiatric/Behavioral: Negative.     Blood pressure 118/63, pulse 77, temperature 98.5 F (36.9 C), temperature source Oral, resp. rate 16, SpO2 97.00%. Physical Exam  Constitutional: He is oriented to person, place, and time. He appears well-developed and well-nourished.  HENT:  Head: Normocephalic.  Eyes: Pupils are equal, round, and reactive to light.  Neck: Normal range of motion.  Cardiovascular: Normal rate.   Respiratory: Effort normal.  GI: Soft.  Neurological: He is alert and oriented to person, place, and time.  Skin: Skin is warm and dry.     Assessment/Plan 68 year old gentleman presents for L3-4 L4-5 decompression and fusion  [name] 12/14/2013, 7:01 AM

## 2013-12-14 NOTE — Op Note (Signed)
Preoperative diagnosis: Grade 1 spondylolisthesis L4-5 with severe spinal stenosis at L3-4 and L4-5 with severe foraminal stenosis of the L3, L4, and L5 nerve roots recurrent disc herniation L3-4 and lumbar degenerative disc disease L3-4 and L4-5  Postoperative diagnosis: Same  Procedure: #1 redo decompressive lumbar laminectomy L3-4 complete medial facetectomy radical foraminotomies in excess and requiring more work than would be needed with a standard interbody fusion.   #2 decompressive lumbar t L4-5 completely a facetectomy radical foraminotomy in excess and requiring more work than would be needed with a standard interbody fusion  #3 posterior lumbar interbody fusion L3-4 L4-5 using the globus arise expandable peek cages packed with local autograft mixed with DBX and BMP  #4 pedicle screw fixation L3-L5 using the globus Creo modular pedicle screw set was 6 5 x 45 screws inserted L3 and L4 and 6 5 x 40 L5  #5 posterior lateral to the cyst using locally harvested our graft mixed with DBX and BMP  #6 a production spot for  #7  place a large Hemovac drain  Surgeon: Dominica Severin Rakeb Kibble  Assistant: Erline Levine  Anesthesia: Gen.  EBL: 703 100 of Cell Saver given back  History of present illness: Patient reports 68 year old and he said multiple long-standing chronic issues with his low back and probably right hip and leg patient on previous laminotomy on the left at L3-4 as well as a resection foraminal discectomy on the right at L3-4 developed recurrent disc herniation and severe lumbar spinal stenosis at L3-4 he also had a grade 1 spinal listhesis at L4-5 was with marked foraminal stenosis and severe stenosis that level as well due to patient's multiple recurrent disc herniation takes her treatment imaging findings I recommended decompression stabilization procedure at L3-4 and L4-5 extensively reviewed the risks and benefits of the operation the patient as well as perioperative course expectations  about alternatives of surgery he understood and agreed to proceed forward.   Operative procedure: Patient brought into the or was induced under general anesthesia positioned prone frame his back was prepped and draped in routine sterile fashion his old incision was opened up and extended cephalad caudally after infiltration 10 cc lidocaine with epi scar tissues dissected free and subperiosteal dissections care lamina of L3-L4 and L5 bilaterally there is marked instability of spinal laminar complexes noted at L3 and L4. Since then the spinous processes were then removed complete central decompression was begun after confirming appropriate level with intraoperative fluoroscopy. The scar tissue was extensively dissected off of the lamina defect at L3-4 on the left on complete medial facetectomies were performed at L3-4 and L4-5 there was marked spinal stenosis due to the severe to severe facet arthropathy and overgrowth of the superior tickling facet at each level is all grossly under been gaining access lateral margins disc space and aggressively performing foraminotomies of the L3, L4 and L5 nerve roots bilaterally. After adequate decompression achieved in all nerve roots to second pedicle screw placement using a high-speed drill pilot holes were drilled K. with the awl probed F. Probed again 6 5 x 45 screws inserted fluoroscopy was used the step along the way to confirm the trajectory and direct inspection within the canal as well with the pedicle confirm no medial or lateral breech. A 6 5 x 45 screws were inserted L3 and L4 6 5 x 40 L5 after all screws in place is to the interbody work using the epidermis coagulated the space was incised using sequential distraction I distracted up to  an 11 mm Stryker L3-4 significantly opened up the disc space and decompressing the 3-4 foramen. The spaces and cleanout radically with rotating cutters Epstein curettes and pituitary rongeurs after adequate endplate preparation been  achieved a 1117 expandable rise peek cage packable local are graft mixed DBX placed in the interspace and expanded to approximately 13 mm then the distractors removed and adequate endplate was carried out and the opposite side local autograft mixed with DBX and BMP was packed centrally and laterally then and the other cages inserted. This whole process was repeated at the disc space level and a similar fashion same size cage expanded a similar size. Expanding L4-5 reduce the deformity by about 50% there is also help reduce the foraminal stenosis and decompressed both for the fiber to after all interbody work been done aggressive decortication was carried out after irrigation local are graft was intact posterior laterally along with DBX and BMP then the rods were selected size and head were reassembled to the screws rods were placed top tightness tightened down the foraminal reinspected confirm patency Gelfoam was laid up the dura the muscle fascia pressure layers with after Vicryl the skin was) 4 subcuticular Dermabond benzo and Steri-Strips were fourth of central plica with the patient recovered in stable condition. At the adjacent*is recurrent.

## 2013-12-14 NOTE — Progress Notes (Signed)
Pt. Still very uncomfortable after Dilaudid IV and pca began, Dr Tobias Alexander aware and versed ordered

## 2013-12-14 NOTE — Anesthesia Postprocedure Evaluation (Signed)
Anesthesia Post Note  Patient: Michael Barr  Procedure(s) Performed: Procedure(s) (LRB): L/3-4 PLIF, L/4-5 (N/A)  Anesthesia type: general  Patient location: PACU  Post pain: Pain level controlled  Post assessment: Patient's Cardiovascular Status Stable  Post vital signs: Reviewed and stable  Level of consciousness: sedated  Complications: No apparent anesthesia complications

## 2013-12-14 NOTE — Progress Notes (Signed)
ANTIBIOTIC CONSULT NOTE - INITIAL  Pharmacy Consult for vancomycin Indication: surgical prophylaxis  Allergies  Allergen Reactions  . Penicillins Rash    Vital Signs: Temp: 98.6 F (37 C) (02/20 1445) Temp src: Oral (02/20 0601) BP: 155/81 mmHg (02/20 1248) Pulse Rate: 109 (02/20 1445) Intake/Output from previous day:   Intake/Output from this shift: Total I/O In: 2720 [I.V.:2200; Blood:270; IV Piggyback:250] Out: 1360 [Urine:560; Drains:100; Blood:700]  Labs: No results found for this basename: WBC, HGB, PLT, LABCREA, CREATININE,  in the last 72 hours The CrCl is unknown because both a height and weight (above a minimum accepted value) are required for this calculation. No results found for this basename: VANCOTROUGH, Corlis Leak, VANCORANDOM, GENTTROUGH, GENTPEAK, GENTRANDOM, TOBRATROUGH, TOBRAPEAK, TOBRARND, AMIKACINPEAK, AMIKACINTROU, AMIKACIN,  in the last 72 hours   Microbiology: Recent Results (from the past 720 hour(s))  SURGICAL PCR SCREEN     Status: Abnormal   Collection Time    12/04/13  1:30 PM      Result Value Ref Range Status   MRSA, PCR NEGATIVE  NEGATIVE Final   Staphylococcus aureus POSITIVE (*) NEGATIVE Final   Comment:            The Xpert SA Assay (FDA     approved for NASAL specimens     in patients over 5 years of age),     is one component of     a comprehensive surveillance     program.  Test performance has     been validated by [employer] for patients greater     than or equal to 74 year old.     It is not intended     to diagnose infection nor to     guide or monitor treatment.    Medical History: Past Medical History  Diagnosis Date  . Depression     attacks  . Arthritis   . Coronary artery disease   . Hypertension     takes Zestoretic daily  . Hyperlipidemia     takes Zocor daily  . Asthma 70's  . Pneumonia 70's  . Weakness     right leg  . Fibromyalgia   . Joint pain   . Joint swelling   . Chronic back pain      HNP  . Constipation     takes Sennokot nightly  . Nocturia   . History of kidney stones   . Diabetes mellitus without complication     borderline   . Hypokalemia     takes Potassium daily  . Cataract     immature and to the left eye  . Anxiety     takes Xanax and Methadone daily  . Insomnia     takes trazodone nightly    Medications:  Prescriptions prior to admission  Medication Sig Dispense Refill  . acetaminophen (TYLENOL) 500 MG tablet Take 500 mg by mouth daily as needed for mild pain.       Marland Kitchen ALPRAZolam (XANAX) 0.5 MG tablet Take 0.5 mg by mouth 3 (three) times daily as needed for anxiety.      Marland Kitchen aspirin EC 325 MG tablet Take 325 mg by mouth daily.      Marland Kitchen lisinopril-hydrochlorothiazide (PRINZIDE,ZESTORETIC) 20-25 MG per tablet Take 1 tablet by mouth every morning.       . methadone (DOLOPHINE) 10 MG tablet Take 10 mg by mouth every 8 (eight) hours as needed (for anxiety and pain).       Marland Kitchen  morphine (MSIR) 30 MG tablet Take 1 tablet (30 mg total) by mouth every 3 (three) hours as needed for moderate pain or severe pain.  80 tablet  0  . Multiple Vitamins-Minerals (MULTIVITAMIN WITH MINERALS) tablet Take 1 tablet by mouth daily.      . naproxen (NAPROSYN) 500 MG tablet Take 500 mg by mouth 2 (two) times daily with a meal.      . Oxycodone HCl 10 MG TABS Take 10 mg by mouth 3 (three) times daily as needed (severe pain).      Marland Kitchen senna (SENOKOT) 8.6 MG TABS tablet Take 2 tablets by mouth at bedtime.      . simvastatin (ZOCOR) 40 MG tablet Take 40 mg by mouth at bedtime.      . traZODone (DESYREL) 100 MG tablet Take 250 mg by mouth at bedtime.      Marland Kitchen albuterol (PROVENTIL HFA;VENTOLIN HFA) 108 (90 BASE) MCG/ACT inhaler Inhale 1 puff into the lungs every 6 (six) hours as needed for wheezing or shortness of breath.      Marland Kitchen OVER THE COUNTER MEDICATION Take 100 mg by mouth every morning. Take 200 mg in the morning       Scheduled:  . aspirin EC  325 mg Oral Daily  . cyclobenzaprine       . docusate sodium  100 mg Oral BID  . lisinopril  20 mg Oral Daily   And  . hydrochlorothiazide  25 mg Oral Daily  . HYDROmorphone      . HYDROmorphone      . HYDROmorphone PCA 0.3 mg/mL   Intravenous 6 times per day  . midazolam      . naproxen  500 mg Oral BID WC  . senna  2 tablet Oral QHS  . simvastatin  40 mg Oral QHS  . sodium chloride  3 mL Intravenous Q12H  . traZODone  250 mg Oral QHS    Assessment: 68 yo s/p redo decompressive lumbar laminectomy to start vancomycin for surgical prophylaxis. Patient noted with a drain in place, SCr= 0.88 on 2/10 and CrCl ~80 . Last dose of vancomycin was 1500mg  IV at about 7:30am.   Goal of Therapy:  Vancomycin trough level 10-15 mcg/ml  Plan:  -Vancomycin 1500mg  IV q12hr -Will follow renal function and clinical progress  Hildred Laser, Pharm D 12/14/2013 5:04 PM

## 2013-12-14 NOTE — Anesthesia Procedure Notes (Signed)
Procedure Name: Intubation Date/Time: 12/14/2013 7:39 AM Performed by: Neldon Newport Pre-anesthesia Checklist: Patient identified, Timeout performed, Emergency Drugs available, Suction available and Patient being monitored Patient Re-evaluated:Patient Re-evaluated prior to inductionOxygen Delivery Method: Circle system utilized Preoxygenation: Pre-oxygenation with 100% oxygen Intubation Type: IV induction Ventilation: Mask ventilation without difficulty Laryngoscope Size: Mac and 4 Grade View: Grade II Tube type: Oral Tube size: 7.5 mm Number of attempts: 1 Intubation method: pillow left under head. Placement Confirmation: positive ETCO2,  CO2 detector,  ETT inserted through vocal cords under direct vision and breath sounds checked- equal and bilateral Secured at: 23 cm Tube secured with: Tape Dental Injury: Teeth and Oropharynx as per pre-operative assessment

## 2013-12-14 NOTE — Transfer of Care (Signed)
Immediate Anesthesia Transfer of Care Note  Patient: Michael Barr  Procedure(s) Performed: Procedure(s): L/3-4 PLIF, L/4-5 (N/A)  Patient Location: PACU  Anesthesia Type:General  Level of Consciousness: awake, alert  and oriented  Airway & Oxygen Therapy: Patient Spontanous Breathing and Patient connected to face mask oxygen  Post-op Assessment: Report given to PACU RN, Post -op Vital signs reviewed and stable and Patient moving all extremities X 4  Post vital signs: Reviewed and stable  Complications: No apparent anesthesia complications

## 2013-12-14 NOTE — Anesthesia Preprocedure Evaluation (Addendum)
Anesthesia Evaluation  Patient identified by MRN, date of birth, ID band Patient awake    Reviewed: Allergy & Precautions, H&P , NPO status , Patient's Chart, lab work & pertinent test results, reviewed documented beta blocker date and time   History of Anesthesia Complications Negative for: history of anesthetic complications  Airway Mallampati: III TM Distance: >3 FB Neck ROM: Full    Dental  (+) Teeth Intact, Dental Advisory Given   Pulmonary asthma , former smoker,    Pulmonary exam normal       Cardiovascular hypertension, + CAD  I was given patient's chart to review after he had left his PAT visit.  I was not able to reach him this afternoon on his house phone.  According to his PAT RN, patient underwent CABG X 1 (questionable LIMA to LAD) for "widow maker" lesion.  His previous cardiologist retired, so now he is being followed by his Internist Dr. Kenton Kingfisher.  His last stress test was > 5 years ago. Dr. Kenton Kingfisher is aware of plans for surgery and saw patient on 09/04/13.  He did a preoperative CXR and EKG.  Patient was asymptomatic from a CAD standpoint and EKG was stable so no further testing was ordered.   EKG on 09/04/13 (Dr. Kenton Kingfisher) showed NSR, inferior Q waves that were stable since 2013.  No worrisome ST/T wave changes.    Neuro/Psych PSYCHIATRIC DISORDERS Depression  Neuromuscular disease    GI/Hepatic negative GI ROS, Neg liver ROS,   Endo/Other  diabetes  Renal/GU negative Renal ROS     Musculoskeletal   Abdominal   Peds  Hematology   Anesthesia Other Findings   Reproductive/Obstetrics                          Anesthesia Physical Anesthesia Plan  ASA: III  Anesthesia Plan: General   Post-op Pain Management:    Induction: Intravenous  Airway Management Planned: Oral ETT  Additional Equipment:   Intra-op Plan:   Post-operative Plan: Extubation in OR  Informed Consent: I have  reviewed the patients History and Physical, chart, labs and discussed the procedure including the risks, benefits and alternatives for the proposed anesthesia with the patient or authorized representative who has indicated his/her understanding and acceptance.   Dental Advisory Given  Plan Discussed with: Anesthesiologist, CRNA and Surgeon  Anesthesia Plan Comments:        Anesthesia Quick Evaluation                                  Anesthesia Evaluation  Patient identified by MRN, date of birth, ID band Patient awake    Reviewed: Allergy & Precautions, H&P , NPO status , Patient's Chart, lab work & pertinent test results  Airway Mallampati: III TM Distance: >3 FB Neck ROM: Full    Dental  (+) Dental Advisory Given and Teeth Intact   Pulmonary asthma , former smoker,          Cardiovascular hypertension, Pt. on medications + CAD and + CABG  09/18/13 Preop Visit:  Patient underwent CABG X 1 (questionable LIMA to LAD) for "widow maker" lesion.  His previous cardiologist retired, so now he is being followed by his Internist Dr. Kenton Kingfisher.  His last stress test was > 5 years ago. Dr. Kenton Kingfisher is aware of plans for surgery and saw patient on 09/04/13.  He did a preoperative CXR and EKG.  Patient was asymptomatic from a CAD standpoint and EKG was stable.   Neuro/Psych PSYCHIATRIC DISORDERS Anxiety Depression    GI/Hepatic   Endo/Other  diabetes, Type 2Morbid obesity  Renal/GU      Musculoskeletal  (+) Arthritis -,   Abdominal   Peds  Hematology   Anesthesia Other Findings   Reproductive/Obstetrics                       Anesthesia Physical Anesthesia Plan  ASA: III  Anesthesia Plan: General   Post-op Pain Management:    Induction: Intravenous  Airway Management Planned: Oral ETT  Additional Equipment:   Intra-op Plan:   Post-operative Plan: Extubation in OR  Informed Consent: I have reviewed the patients History and  Physical, chart, labs and discussed the procedure including the risks, benefits and alternatives for the proposed anesthesia with the patient or authorized representative who has indicated his/her understanding and acceptance.   Dental advisory given  Plan Discussed with: CRNA, Anesthesiologist and Surgeon  Anesthesia Plan Comments:         Anesthesia Quick Evaluation

## 2013-12-15 ENCOUNTER — Encounter (HOSPITAL_COMMUNITY): Payer: Self-pay | Admitting: *Deleted

## 2013-12-15 NOTE — Progress Notes (Signed)
Patient ID: Michael Barr, male   DOB: [birth year], 68 y.o.   MRN: [id number] Doing well. C/o incisional pain, no weakness.pt to help with his care. Continue with iv analgesia

## 2013-12-15 NOTE — Evaluation (Signed)
Physical Therapy Evaluation Patient Details Name: Michael Barr MRN: [id number] DOB: 19-May-1946 Today's Date: 12/15/2013 Time: 8413-2440 PT Time Calculation (min): 47 min  PT Assessment / Plan / Recommendation History of Present Illness  pt presents with L3-5 PLIF and hx of multiple back surgeries.    Clinical Impression  Pt indicates long term pain from back, but motivated to improve mobility.  Will continue to follow.      PT Assessment  Patient needs continued PT services    Follow Up Recommendations  Home health PT;Supervision/Assistance - 24 hour    Does the patient have the potential to tolerate intense rehabilitation      Barriers to Discharge        Equipment Recommendations  3in1 (PT)    Recommendations for Other Services     Frequency Min 5X/week    Precautions / Restrictions Precautions Precautions: Fall;Back Precaution Booklet Issued: Yes (comment) Required Braces or Orthoses: Spinal Brace Spinal Brace: Lumbar corset;Applied in sitting position Restrictions Weight Bearing Restrictions: No   Pertinent Vitals/Pain Indicates 7/10 with mobility.  Using PCA.        Mobility  Bed Mobility Overal bed mobility: Needs Assistance Bed Mobility: Rolling;Sidelying to Sit Rolling: Min assist Sidelying to sit: Min assist General bed mobility comments: cues for log roll and sequencing.   Transfers Overall transfer level: Needs assistance Equipment used: Rolling walker (2 wheeled) Transfers: Sit to/from Stand Sit to Stand: Min assist General transfer comment: cues for UE use and getting closer to chair prior to sitting.   Ambulation/Gait Ambulation/Gait assistance: Min guard Ambulation Distance (Feet): 40 Feet Assistive device: Rolling walker (2 wheeled) Gait Pattern/deviations: Step-through pattern;Decreased stride length General Gait Details: cues for upright posture and back precautions with turns.      Exercises     PT Diagnosis: Difficulty  walking;Acute pain  PT Problem List: Decreased strength;Decreased activity tolerance;Decreased balance;Decreased mobility;Decreased knowledge of use of DME;Decreased knowledge of precautions;Pain PT Treatment Interventions: DME instruction;Gait training;Stair training;Functional mobility training;Therapeutic activities;Therapeutic exercise;Balance training;Neuromuscular re-education;Patient/family education     PT Goals(Current goals can be found in the care plan section) Acute Rehab PT Goals Patient Stated Goal: Back to work PT Goal Formulation: With patient Time For Goal Achievement: 12/22/13 Potential to Achieve Goals: Good  Visit Information  Last PT Received On: 12/15/13 Assistance Needed: +1 History of Present Illness: pt presents with L3-5 PLIF and hx of multiple back surgeries.         Prior [place] expects to be discharged to:: Private residence Living Arrangements: Spouse/significant other Available Help at Discharge: Family;Available 24 hours/day Type of Home: House Home Access: Stairs to enter [employer] of Steps: 2 Entrance Stairs-Rails: Right Home Layout: One level Home Equipment: Walker - 2 wheels Prior Function Level of Independence: Independent Communication Communication: No difficulties    Cognition  Cognition Arousal/Alertness: Awake/alert Behavior During Therapy: WFL for tasks assessed/performed Overall Cognitive Status: Within Functional Limits for tasks assessed    Extremity/Trunk Assessment Upper Extremity Assessment Upper Extremity Assessment: Defer to OT evaluation Lower Extremity Assessment Lower Extremity Assessment: Generalized weakness   Balance    End of Session PT - End of Session Equipment Utilized During Treatment: Gait belt;Back brace Activity Tolerance: Patient tolerated treatment well Patient left: in chair;with call bell/phone within reach;with family/visitor present Nurse  Communication: Mobility status  GP     RitenourThornton Papas, [place] 12/15/2013, 10:12 AM

## 2013-12-15 NOTE — Evaluation (Signed)
Occupational Therapy Evaluation Patient Details Name: Michael Barr MRN: [id number] DOB: September 15, 1946 Today's Date: 12/15/2013 Time: 9833-8250 OT Time Calculation (min): 21 min  OT Assessment / Plan / Recommendation History of present illness pt presents with L3-5 PLIF and hx of multiple back surgeries.     Clinical Impression   This is patient's third back surgery, the second was in December on '14.  Wife reports pt falling down stairs requiring this surgery.  Pt and wife are knowledgeable in back precautions and adaptive equipment use.  Pt will rely on his wife to assist with LB ADL and supervise tub transfer until he is able to perform independently.  Pt will stand holding grab bar when showering, no interest in tub equipment.  No further OT needs.    OT Assessment  Patient does not need any further OT services    Follow Up Recommendations  No OT follow up    Barriers to Discharge      Equipment Recommendations  None recommended by OT    Recommendations for Other Services    Frequency       Precautions / Restrictions Precautions Precautions: Fall;Back Precaution Booklet Issued: Yes (comment) Precaution Comments: pt and wife are knowledgeable from previous surgeries Required Braces or Orthoses: Spinal Brace Spinal Brace: Lumbar corset;Applied in sitting position Restrictions Weight Bearing Restrictions: No   Pertinent Vitals/Pain Did not rate back pain, using PCA and repositioned pt in supine, VSS    ADL  Eating/Feeding: Independent Where Assessed - Eating/Feeding: Chair Grooming: Wash/dry hands;Min guard Where Assessed - Grooming: Unsupported standing Upper Body Bathing: Minimal assistance Where Assessed - Upper Body Bathing: Unsupported sitting Lower Body Bathing: Maximal assistance Where Assessed - Lower Body Bathing: Unsupported sitting;Supported sit to stand Upper Body Dressing: Minimal assistance (back brace) Where Assessed - Upper Body Dressing: Unsupported  sitting Lower Body Dressing: Maximal assistance Where Assessed - Lower Body Dressing: Unsupported sitting;Supported sit to stand Toilet Transfer: Minimal assistance Toilet Transfer Method: Sit to stand Equipment Used: Back brace;Gait belt;Rolling walker Transfers/Ambulation Related to ADLs: min guard to walk with walker ADL Comments: Pt and wife are familiar with AE for LB ADL.  Wife will assist pt with LB ADL or he will use his reacher.  Plans to stand to shower.  Instructed pt and wife to avoid bending across tub to reach grab bar and how to use RW to steady himself  with wife assisting with tub transfer.    OT Diagnosis:    OT Problem List:   OT Treatment Interventions:     OT Goals(Current goals can be found in the care plan section) Acute Rehab OT Goals Patient Stated Goal: Back to work  Visit Information  Last OT Received On: 12/15/13 Assistance Needed: +1 History of Present Illness: pt presents with L3-5 PLIF and hx of multiple back surgeries.         Prior [place] expects to be discharged to:: Private residence Living Arrangements: Spouse/significant other Available Help at Discharge: Family;Available 24 hours/day Type of Home: House Home Access: Stairs to enter [employer] of Steps: 2 Entrance Stairs-Rails: Right Home Layout: One level Home Equipment: Walker - 2 wheels;Adaptive equipment;Grab bars - tub/shower Adaptive Equipment: Reacher Additional Comments: sink beside toilet Prior Function Level of Independence: Independent Communication Communication: No difficulties Dominant Hand: Right         Vision/Perception Vision - History Patient Visual Report: No change from baseline   Cognition  Cognition Arousal/Alertness: Lethargic Behavior During  Therapy: WFL for tasks assessed/performed Overall Cognitive Status: Within Functional Limits for tasks assessed    Extremity/Trunk Assessment Upper Extremity  Assessment Upper Extremity Assessment: Overall WFL for tasks assessed Lower Extremity Assessment Lower Extremity Assessment: Defer to PT evaluation     Mobility Bed Mobility Overal bed mobility: Needs Assistance Bed Mobility: Sit to Sidelying Sit to sidelying: Min assist General bed mobility comments: cues for log roll and sequencing.   Transfers Overall transfer level: Needs assistance Equipment used: Rolling walker (2 wheeled) Transfers: Sit to/from Stand Sit to Stand: Min assist General transfer comment: cues for UE use     Exercise     Balance     End of Session OT - End of Session Activity Tolerance: Patient limited by fatigue Patient left: in bed;with call bell/phone within reach;with family/visitor present  GO     Malka So 12/15/2013, 1:43 PM [phone]

## 2013-12-16 LAB — CREATININE, SERUM
CREATININE: 0.96 mg/dL (ref 0.50–1.35)
GFR calc Af Amer: >90 mL/min (ref >90)
GFR calc non Af Amer: 85 mL/min — ABNORMAL LOW (ref >90)

## 2013-12-16 LAB — VANCOMYCIN, TROUGH: VANCOMYCIN TR: 25.5 ug/mL — AB (ref 10.0–20.0)

## 2013-12-16 NOTE — Progress Notes (Signed)
Subjective: Patient reports still having pain  Objective: Vital signs in last 24 hours: Temp:  [97.8 F (36.6 C)-98.9 F (37.2 C)] 98.6 F (37 C) (02/22 0856) Pulse Rate:  [65-87] 81 (02/22 0856) Resp:  [10-23] 20 (02/22 0856) BP: (100-142)/(52-90) 100/65 mmHg (02/22 0856) SpO2:  [93 %-98 %] 95 % (02/22 0856) Weight:  [137.44 kg (303 lb)] 137.44 kg (303 lb) (02/21 2022)  Intake/Output from previous day: 02/21 0701 - 02/22 0700 In: -  Out: 300 [Urine:225; Drains:75] Intake/Output this shift:    Physical Exam: Strength full.  Drain out.  Dressing CDI  Lab Results: No results found for this basename: WBC, HGB, HCT, PLT,  in the last 72 hours BMET  Recent Labs  12/16/13 1025  CREATININE 0.96    Studies/Results: No results found.  Assessment/Plan: D/C PCA.  Start home pain medication regimen.  Continue PT    LOS: 2 days    Peggyann Shoals, MD 12/16/2013, 11:38 AM

## 2013-12-16 NOTE — Progress Notes (Signed)
Physical Therapy Treatment Patient Details Name: Michael Barr MRN: [id number] DOB: 05/14/1946 Today's Date: 12/16/2013 Time: 4825-0037 PT Time Calculation (min): 33 min  PT Assessment / Plan / Recommendation  History of Present Illness pt presents with L3-5 PLIF and hx of multiple back surgeries.     PT Comments   Patient making slow improvements with mobility/gait.    Follow Up Recommendations  Home health PT;Supervision/Assistance - 24 hour     Does the patient have the potential to tolerate intense rehabilitation     Barriers to Discharge        Equipment Recommendations  3in1 (PT)    Recommendations for Other Services    Frequency Min 5X/week   Progress towards PT Goals Progress towards PT goals: Progressing toward goals  Plan Current plan remains appropriate    Precautions / Restrictions Precautions Precautions: Fall;Back Precaution Comments: Reviewed back precautions with patient and wife. Required Braces or Orthoses: Spinal Brace Spinal Brace: Lumbar corset;Applied in sitting position Restrictions Weight Bearing Restrictions: No   Pertinent Vitals/Pain Pain limiting factor with gait    Mobility  Transfers Overall transfer level: Needs assistance Equipment used: Rolling walker (2 wheeled) Transfers: Sit to/from Stand Sit to Stand: Min assist General transfer comment: Verbal cues for hand placement. Assist for balance during transitions Ambulation/Gait Ambulation/Gait assistance: Min guard Ambulation Distance (Feet): 60 Feet Assistive device: Rolling walker (2 wheeled) Gait Pattern/deviations: Step-through pattern;Decreased stride length;Trunk flexed Gait velocity: Slow Gait velocity interpretation: Below normal speed for age/gender General Gait Details: Verbal cues for safe use of RW during turns.  Cues to stand upright.  Patient required 2 standing rest breaks during gait.      PT Goals (current goals can now be found in the care plan section)     Visit Information  Last PT Received On: 12/16/13 Assistance Needed: +1 History of Present Illness: pt presents with L3-5 PLIF and hx of multiple back surgeries.      Subjective Data  Subjective: Patient and wife with multiple questions - answered for them.   Cognition  Cognition Arousal/Alertness: Awake/alert Behavior During Therapy: WFL for tasks assessed/performed Overall Cognitive Status: Within Functional Limits for tasks assessed    Balance     End of Session PT - End of Session Equipment Utilized During Treatment: Gait belt;Back brace Activity Tolerance: Patient limited by pain Patient left: in chair;with call bell/phone within reach;with family/visitor present Nurse Communication: Mobility status   GP     Despina Pole 12/16/2013, 7:16 PM Carita Pian. Sanjuana Kava, [place] Pager [phone]

## 2013-12-17 MED FILL — Heparin Sodium (Porcine) Inj 1000 Unit/ML: INTRAMUSCULAR | Qty: 30 | Status: AC

## 2013-12-17 MED FILL — Sodium Chloride IV Soln 0.9%: INTRAVENOUS | Qty: 2000 | Status: AC

## 2013-12-17 NOTE — Progress Notes (Signed)
Physical Therapy Treatment Patient Details Name: Michael Barr MRN: [id number] DOB: Jun 19, 1946 Today's Date: 12/17/2013 Time: 2703-5009 PT Time Calculation (min): 17 min  PT Assessment / Plan / Recommendation  History of Present Illness pt presents with L3-5 PLIF and hx of multiple back surgeries.     PT Comments   Patient progressing well. Will review bed mobility next session. Anticipate DC next day or two per Dr. Windy Carina note.   Follow Up Recommendations  Home health PT;Supervision/Assistance - 24 hour     Does the patient have the potential to tolerate intense rehabilitation     Barriers to Discharge        Equipment Recommendations  3in1 (PT)    Recommendations for Other Services    Frequency Min 5X/week   Progress towards PT Goals Progress towards PT goals: Progressing toward goals  Plan Current plan remains appropriate    Precautions / Restrictions Precautions Precautions: Fall;Back Precaution Comments: Reviewed back precautions with patient and wife. Required Braces or Orthoses: Spinal Brace Spinal Brace: Lumbar corset;Applied in sitting position   Pertinent Vitals/Pain 8/10 back pain. RN provided medication to assist with pain control     Mobility  Transfers Equipment used: Rolling walker (2 wheeled) Transfers: Sit to/from Stand Sit to Stand: Min guard General transfer comment: Verbal cues for hand placement. Ambulation/Gait Ambulation/Gait assistance: Min guard Ambulation Distance (Feet): 100 Feet Assistive device: Rolling walker (2 wheeled) General Gait Details: Verbal cues for safe use of RW during turns.  Cues to stand upright.  No standing breaks required this session    Exercises     PT Diagnosis:    PT Problem List:   PT Treatment Interventions:     PT Goals (current goals can now be found in the care plan section)    Visit Information  Last PT Received On: 12/17/13 Assistance Needed: +1 History of Present Illness: pt presents with L3-5  PLIF and hx of multiple back surgeries.      Subjective Data      Cognition  Cognition Arousal/Alertness: Awake/alert Behavior During Therapy: WFL for tasks assessed/performed Overall Cognitive Status: Within Functional Limits for tasks assessed    Balance     End of Session PT - End of Session Equipment Utilized During Treatment: Gait belt;Back brace Activity Tolerance: Patient tolerated treatment well;Patient limited by fatigue Patient left: in chair;with call bell/phone within reach Nurse Communication: Mobility status   GP     Jacqualyn Posey 12/17/2013, 2:37 PM 12/17/2013 Jacqualyn Posey PTA [phone] pager [phone] office

## 2013-12-17 NOTE — Progress Notes (Signed)
Patient ID: Michael Barr, male   DOB: January 10, 1946, 68 y.o.   MRN: [id number] Making good progress no leg pain back pain is becoming more manageable mobilizing better  Strength 5 out of 5 wound clean and dry  Continue physical occupational therapy probable discharge tomorrow or the next day

## 2013-12-17 NOTE — Progress Notes (Signed)
Utilization review completed.  

## 2013-12-18 MED ORDER — POLYETHYLENE GLYCOL 3350 17 G PO PACK
17.0000 g | PACK | Freq: Every day | ORAL | Status: DC
  Administered 2013-12-18: 17 g via ORAL
  Filled 2013-12-18 (×2): qty 1

## 2013-12-18 NOTE — Progress Notes (Signed)
Subjective: Patient reports Overall is doing okay pain is getting better passing gas but no bowel movements are  Objective: Vital signs in last 24 hours: Temp:  [98.2 F (36.8 C)-99.3 F (37.4 C)] 98.7 F (37.1 C) (02/24 0514) Pulse Rate:  [64-74] 72 (02/24 0514) Resp:  [18-20] 20 (02/24 0514) BP: (100-137)/(49-98) 106/58 mmHg (02/24 0514) SpO2:  [97 %-100 %] 97 % (02/24 0514)  Intake/Output from previous day:   Intake/Output this shift:    Awake alert strength out of 5 wound clean and dry  Lab Results: No results found for this basename: WBC, HGB, HCT, PLT,  in the last 72 hours BMET  Recent Labs  12/16/13 1025  CREATININE 0.96    Studies/Results: No results found.  Assessment/Plan: Progressive mobilization a workup bowels continue physical therapy  LOS: 4 days     Michael Barr P 12/18/2013, 7:58 AM

## 2013-12-18 NOTE — Progress Notes (Signed)
Physical Therapy Treatment Patient Details Name: Gerrald Basu MRN: [id number] DOB: 1946/05/29 Today's Date: 12/18/2013 Time: 9833-8250 PT Time Calculation (min): 28 min  PT Assessment / Plan / Recommendation  History of Present Illness pt presents with L3-5 PLIF and hx of multiple back surgeries.     PT Comments   Patient making really good progress this session with mobility. Able to practice stairs without difficulty. Will see again in AM for possibly DC home and review of steps  Follow Up Recommendations  Home health PT;Supervision/Assistance - 24 hour     Does the patient have the potential to tolerate intense rehabilitation     Barriers to Discharge        Equipment Recommendations  3in1 (PT)    Recommendations for Other Services    Frequency Min 5X/week   Progress towards PT Goals Progress towards PT goals: Progressing toward goals  Plan Current plan remains appropriate    Precautions / Restrictions Precautions Precautions: Fall;Back Precaution Comments: Reviewed back precautions with patient and wife. Required Braces or Orthoses: Spinal Brace Spinal Brace: Lumbar corset;Applied in sitting position   Pertinent Vitals/Pain no apparent distress     Mobility  Transfers Overall transfer level: Needs assistance Equipment used: Rolling walker (2 wheeled) Sit to Stand: Min guard General transfer comment: Verbal cues for hand placement and to keep weight anterior with stand Ambulation/Gait Ambulation/Gait assistance: Min guard Ambulation Distance (Feet): 150 Feet Assistive device: Rolling walker (2 wheeled) Gait Pattern/deviations: Step-through pattern;Decreased stride length Gait velocity interpretation: Below normal speed for age/gender General Gait Details: Cues for upright posture Stairs: Yes Stairs assistance: Min assist Stair Management: Step to pattern;Backwards Number of Stairs: 2 General stair comments: CUes for steps backwards and use of RW. Practiced  twice    Exercises     PT Diagnosis:    PT Problem List:   PT Treatment Interventions:     PT Goals (current goals can now be found in the care plan section)    Visit Information  Last PT Received On: 12/18/13 Assistance Needed: +1 History of Present Illness: pt presents with L3-5 PLIF and hx of multiple back surgeries.      Subjective Data      Cognition  Cognition Arousal/Alertness: Awake/alert Behavior During Therapy: WFL for tasks assessed/performed Overall Cognitive Status: Within Functional Limits for tasks assessed    Balance     End of Session PT - End of Session Equipment Utilized During Treatment: Gait belt;Back brace Activity Tolerance: Patient tolerated treatment well Patient left: in chair;with call bell/phone within reach Nurse Communication: Mobility status   GP     Jacqualyn Posey 12/18/2013, 2:00 PM  12/18/2013 Jacqualyn Posey PTA [phone] pager [phone] office

## 2013-12-19 MED ORDER — CYCLOBENZAPRINE HCL 10 MG PO TABS: 10.0000 mg | ORAL_TABLET | Freq: Three times a day (TID) | ORAL | Status: DC | PRN

## 2013-12-19 MED ORDER — OXYCODONE HCL 10 MG PO TABS: 10.0000 mg | ORAL_TABLET | Freq: Three times a day (TID) | ORAL | Status: DC | PRN

## 2013-12-19 NOTE — Progress Notes (Signed)
Talked to patient with spouse present about [facility]/ [facility] choices, patient chose [facility]; [facility] with [facility] called for arrangements; Aneta Mins [phone]

## 2013-12-19 NOTE — Discharge Instructions (Signed)
No lifting no bending no twisting no driving a riding a car less he is come back and forth to see me. Keep incision clean and dry.

## 2013-12-19 NOTE — Discharge Summary (Signed)
Physician Discharge Summary  Patient ID: Michael Barr MRN: [id number] DOB/AGE: 66-Dec-1947 66 y.o.  Admit date: 12/14/2013 Discharge date: 12/19/2013  Admission Diagnoses:lumbar spondylosis and spinal stenosis L3-4 with herniated nucleus pulposus and lumbar stenosis L4-5 grade 1 spondylolisthesis  Discharge Diagnoses: same Active Problems:   Spinal stenosis of lumbar region   Discharged Condition: good  Hospital Course: as a the hospital underwent decompressive laminectomy and fusion L3-4 L4-5 postoperatively patient did very well with her and the floor void convalesced well with external days. His pain progressed the under control he was ambulating he was voiding tolerating regular diet pain was well controlled on pills and was stable for discharge  Consults: Significant Diagnostic Studies: Treatments:L3-4 L4-5 decompression and fusion Discharge Exam: Blood pressure 127/64, pulse 77, temperature 98 F (36.7 C), temperature source Oral, resp. rate 18, height 5\' 8"  (1.727 m), weight 137.44 kg (303 lb), SpO2 96.00%. Strength out of 5 wound clean and dry  Disposition: home  Discharge Orders   Future Orders Complete By Expires   Face-to-face encounter (required for Medicare/Medicaid patients)  As directed    Comments:     I Lavern Maslow P certify that this patient is under my care and that I, or a nurse practitioner or physician's assistant working with me, had a face-to-face encounter that meets the physician face-to-face encounter requirements with this patient on 12/19/2013. The encounter with the patient was in whole, or in part for the following medical condition(s) which is the primary reason for home health care (List medical condition): lumbar stenosis   Questions:     The encounter with the patient was in whole, or in part, for the following medical condition, which is the primary reason for home health care:  lumbar stenosis   I certify that, based on my findings, the  following services are medically necessary home health services:  Physical therapy   My clinical findings support the need for the above services:  Unable to leave home safely without assistance and/or assistive device   Further, I certify that my clinical findings support that this patient is homebound due to:  Unable to leave home safely without assistance   Reason for Medically Necessary Home Health Services:  Therapy- Home Adapt[facility] to [place]  As directed    Questions:     To provide the following care/treatments:  PT       Medication List         acetaminophen 500 MG tablet  Commonly known as:  TYLENOL  Take 500 mg by mouth daily as needed for mild pain.     albuterol 108 (90 BASE) MCG/ACT inhaler  Commonly known as:  PROVENTIL HFA;VENTOLIN HFA  Inhale 1 puff into the lungs every 6 (six) hours as needed for wheezing or shortness of breath.     ALPRAZolam 0.5 MG tablet  Commonly known as:  XANAX  Take 0.5 mg by mouth 3 (three) times daily as needed for anxiety.     aspirin EC 325 MG tablet  Take 325 mg by mouth daily.     cyclobenzaprine 10 MG tablet  Commonly known as:  FLEXERIL  Take 1 tablet (10 mg total) by mouth 3 (three) times daily as needed for muscle spasms.     lisinopril-hydrochlorothiazide 20-25 MG per tablet  Commonly known as:  PRINZIDE,ZESTORETIC  Take 1 tablet by mouth every morning.     methadone 10 MG tablet  Commonly known as:  DOLOPHINE  Take 10 mg by  mouth every 8 (eight) hours as needed (for anxiety and pain).     morphine 30 MG tablet  Commonly known as:  MSIR  Take 1 tablet (30 mg total) by mouth every 3 (three) hours as needed for moderate pain or severe pain.     multivitamin with minerals tablet  Take 1 tablet by mouth daily.     naproxen 500 MG tablet  Commonly known as:  NAPROSYN  Take 500 mg by mouth 2 (two) times daily with a meal.     OVER THE COUNTER MEDICATION  Take 100 mg by mouth every morning. Take  200 mg in the morning     Oxycodone HCl 10 MG Tabs  Take 1 tablet (10 mg total) by mouth 3 (three) times daily as needed (severe pain).     Oxycodone HCl 10 MG Tabs  Take 10 mg by mouth 3 (three) times daily as needed (severe pain).     senna 8.6 MG Tabs tablet  Commonly known as:  SENOKOT  Take 2 tablets by mouth at bedtime.     simvastatin 40 MG tablet  Commonly known as:  ZOCOR  Take 40 mg by mouth at bedtime.     traZODone 100 MG tablet  Commonly known as:  DESYREL  Take 250 mg by mouth at bedtime.           Follow-up Information   Follow up with [facility], MD.   Specialty:  Neurosurgery   Contact information:   [address] [place] [address] [phone]       Signed: Sennie Borden P 12/19/2013, 8:24 AM

## 2013-12-19 NOTE — Progress Notes (Signed)
Patient ID: Michael Barr, male   DOB: 07-Mar-1946, 68 y.o.   MRN: [id number] Doing well pain is managed on pills strength is getting better.  Strength out of 5 wound clean and dry  Discharge home

## 2013-12-19 NOTE — Progress Notes (Signed)
Physical Therapy Treatment Patient Details Name: Michael Barr MRN: [id number] DOB: 1945-12-09 Today's Date: 12/19/2013 Time: 9767-3419 PT Time Calculation (min): 18 min  PT Assessment / Plan / Recommendation  History of Present Illness pt presents with L3-5 PLIF and hx of multiple back surgeries.     PT Comments   Patient continues to make great progress. Plans to DC home today  Follow Up Recommendations  Home health PT;Supervision/Assistance - 24 hour     Does the patient have the potential to tolerate intense rehabilitation     Barriers to Discharge        Equipment Recommendations  3in1 (PT)    Recommendations for Other Services    Frequency Min 5X/week   Progress towards PT Goals Progress towards PT goals: Progressing toward goals  Plan Current plan remains appropriate    Precautions / Restrictions Precautions Precautions: Fall;Back Precaution Comments: Reviewed back precautions with patient and wife. Required Braces or Orthoses: Spinal Brace Spinal Brace: Lumbar corset;Applied in sitting position   Pertinent Vitals/Pain Complained of a little pain. Did not rate. Premedicated    Mobility  Bed Mobility Overal bed mobility: Modified Independent Transfers Equipment used: Rolling walker (2 wheeled) Transfers: Sit to/from Stand Sit to Stand: Supervision General transfer comment: Patient with good technique Ambulation/Gait Ambulation/Gait assistance: Supervision Ambulation Distance (Feet): 100 Feet Assistive device: Rolling walker (2 wheeled) Gait Pattern/deviations: Step-through pattern;Decreased stride length General Gait Details: Better posture this morning General stair comments: deferred practicing steps again. Able to recall correct technique    Exercises     PT Diagnosis:    PT Problem List:   PT Treatment Interventions:     PT Goals (current goals can now be found in the care plan section)    Visit Information  Last PT Received On:  12/19/13 Assistance Needed: +1 History of Present Illness: pt presents with L3-5 PLIF and hx of multiple back surgeries.      Subjective Data      Cognition  Cognition Arousal/Alertness: Awake/alert Behavior During Therapy: WFL for tasks assessed/performed Overall Cognitive Status: Within Functional Limits for tasks assessed    Balance     End of Session PT - End of Session Equipment Utilized During Treatment: Gait belt;Back brace Activity Tolerance: Patient tolerated treatment well Patient left: in bed;with call bell/phone within reach   GP     Jacqualyn Posey 12/19/2013, 12:03 PM 12/19/2013 Jacqualyn Posey PTA [phone] pager [phone] office

## 2015-08-01 ENCOUNTER — Other Ambulatory Visit: Payer: Self-pay | Admitting: Neurosurgery

## 2015-08-01 DIAGNOSIS — M5137 Other intervertebral disc degeneration, lumbosacral region: Secondary | ICD-10-CM

## 2015-08-20 ENCOUNTER — Ambulatory Visit
Admission: RE | Admit: 2015-08-20 | Discharge: 2015-08-20 | Disposition: A | Discharge status: Home / Self Care | Payer: Medicare Other | Source: Ambulatory Visit | Attending: Neurosurgery | Admitting: Neurosurgery

## 2015-08-20 DIAGNOSIS — M5137 Other intervertebral disc degeneration, lumbosacral region: Secondary | ICD-10-CM

## 2015-08-21 ENCOUNTER — Other Ambulatory Visit (HOSPITAL_COMMUNITY): Payer: Self-pay | Admitting: Neurosurgery

## 2015-08-22 ENCOUNTER — Other Ambulatory Visit: Payer: Self-pay | Admitting: Neurosurgery

## 2015-08-22 DIAGNOSIS — M48061 Spinal stenosis, lumbar region without neurogenic claudication: Secondary | ICD-10-CM

## 2015-08-28 ENCOUNTER — Ambulatory Visit
Admission: RE | Admit: 2015-08-28 | Discharge: 2015-08-28 | Disposition: A | Discharge status: Home / Self Care | Payer: Medicare Other | Source: Ambulatory Visit | Attending: Neurosurgery | Admitting: Neurosurgery

## 2015-08-28 DIAGNOSIS — M48061 Spinal stenosis, lumbar region without neurogenic claudication: Secondary | ICD-10-CM

## 2015-09-04 ENCOUNTER — Encounter (HOSPITAL_COMMUNITY): Payer: Self-pay

## 2015-09-04 ENCOUNTER — Other Ambulatory Visit: Payer: Self-pay

## 2015-09-04 ENCOUNTER — Encounter (HOSPITAL_COMMUNITY)
Admission: RE | Admit: 2015-09-04 | Discharge: 2015-09-04 | Disposition: A | Discharge status: Home / Self Care | Payer: Medicare Other | Source: Ambulatory Visit | Attending: Neurosurgery | Admitting: Neurosurgery

## 2015-09-04 DIAGNOSIS — M5136 Other intervertebral disc degeneration, lumbar region: Secondary | ICD-10-CM | POA: Insufficient documentation

## 2015-09-04 DIAGNOSIS — Z01818 Encounter for other preprocedural examination: Secondary | ICD-10-CM | POA: Insufficient documentation

## 2015-09-04 LAB — SURGICAL PCR SCREEN
MRSA, PCR: NEGATIVE
Staphylococcus aureus: NEGATIVE

## 2015-09-04 LAB — TYPE AND SCREEN
ABO/RH(D): A POS
Antibody Screen: NEGATIVE

## 2015-09-04 LAB — GLUCOSE, CAPILLARY: Glucose-Capillary: 117 mg/dL — ABNORMAL HIGH (ref 65–99)

## 2015-09-04 NOTE — Pre-Procedure Instructions (Signed)
Coleman Vieau  09/04/2015      [facility] [name], [address] [place] [address] Phone: [phone] Fax: [phone]    Your procedure is scheduled on Friday, Nov. 18  Report to [facility] Admitting at 8:00 A.M.  Call this number if you have problems the morning of surgery:  [phone]            Any questions prior to surgery call [phone] Monday-Friday 8am-4pm   Remember:  Do not eat food or drink liquids after midnight Thursday, Nov. 17  Take these medicines the morning of surgery with A SIP OF WATER: tylenol if needed, xanax if needed, flexeril if needed, methadone or oxycodone if needed             Stop aspirin, vitamins, herbal medicines, nsaids:advil, ibuprofen, motrin,naproxen on Nov. 11.                                        How to Manage Your Diabetes Before Surgery   Why is it important to control my blood sugar before and after surgery?   Improving blood sugar levels before and after surgery helps healing and can limit problems.  A way of improving blood sugar control is eating a healthy diet by:  - Eating less sugar and carbohydrates  - Increasing activity/exercise  - Talk with your doctor about reaching your blood sugar goals  High blood sugars (greater than 180 mg/dL) can raise your risk of infections and slow down your recovery so you will need to focus on controlling your diabetes during the weeks before surgery.  Make sure that the doctor who takes care of your diabetes knows about your planned surgery including the date and location.  How do I manage my blood sugars before surgery?   Check your blood sugar at least 4 times a day, 2 days before surgery to make sure that they are not too high or low.   Check your blood sugar the morning of your surgery when you wake up and every 2               hours until you get to the Short-Stay unit.  If your blood sugar is less than 70 mg/dL, you will  need to treat for low blood sugar by:  Treat a low blood sugar (less than 70 mg/dL) with 1/2 cup of clear juice (cranberry or apple), 4 glucose tablets, OR glucose gel.  Recheck blood sugar in 15 minutes after treatment (to make sure it is greater than 70 mg/dL).  If blood sugar is not greater than 70 mg/dL on re-check, call [phone] for further instructions.   Report your blood sugar to the Short-Stay nurse when you get to Short-Stay.  References:  [facility], 2007 "How to Manage your Diabetes Before and After Surgery".     Do not wear jewelry.  Do not wear lotions, powders, or perfumes.  You may not wear deodorant.  Do not shave 48 hours prior to surgery.  Men may shave face and neck.  Do not bring valuables to the hospital.  [facility] is not responsible for any belongings or valuables.  Contacts, dentures or bridgework may not be worn into surgery.  Leave your suitcase in the car.  After surgery it may be brought to your room.  For  patients admitted to the hospital, discharge time will be determined by your treatment team.  Patients discharged the day of surgery will not be allowed to drive home.   Name and phone number of your driver:   Special instructions:  Review preparing for surgery  Please read over the following fact sheets that you were given. Pain Booklet, Coughing and Deep Breathing, Blood Transfusion Information, MRSA Information and Surgical Site Infection Prevention

## 2015-09-04 NOTE — Progress Notes (Addendum)
Anesthesia PAT Evaluation: Patient is a 69 year old male scheduled for PLIF L1-2, L2-3 exploration and removal L3-5 on 09/12/15 by Dr. Saintclair [place]. OR room is booked from 10:00AM - 2:44 PM.  History includes CAD s/p CABG X 1 (LIMA-LAD; [facility]) 11/12/94, asthma, anxiety, depression, "borderline" diabetic, arthritis, cholecystectomy, left TKA, decompressive L3-4 laminectomy 10/21/10, right L3-4 laminectomy 09/26/13, L3-5 PLIF 12/14/13. BMI is significant for morbid obesity.   PCP is Dr. Thompson Grayer in [place] [phone]).His next appointment is scheduled for tomorrow morning. He has not seen a cardiologist in years (cardiologist retired).  Meds include Xanax, ASA 325mg  (to hold 5 days preop), lisinopril-HCTZ, methadone, oxycodone, potassium gluconate, Zocor, trazodone.  PAT Vitals: BP 140/68, RR 20, HR 75, T 36.9, O2 sat 96%. He is a pleasant Caucasian male in NAD. A&O. He is morbidly obese. Has rolling walker. Heart RRR, no murmur noted. No carotid bruits. Lungs clear. Trace bilateral pre-tibial edema.   09/04/15 EKG: NSR, inferior infarct (age undetermined), possible anterior infarct (age undetermined). Overall, I think his EKG is stable when compared to 09/04/13 tracing from Dr. Kenton Kingfisher (scanned under Media tab). Patient denied any recent cardiac testing (> 5 years). Last cardiac cath was done at [facility] prior to his CABG. He denied history of MI. He had a cardiac cath after having an abnormal stress test done to evaluate pain at his sternal notch.   Patient denied any chest pain or sternal notch pain since having his CABG (LIMA-LAD). To his recollection, he otherwise had only minor disease. He denied SOB, new fatigue or edema, palpitations, syncope. He has developed significant low back and left thigh pain in the last few weeks and is now ambulating with a rolling walker. Prior to this, he says his activity was not limited. 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 Mother died of a CVA, ESRD in her late 40's. His father died from complications of Alzheimer's dementia.  08/20/15 Lumbar MRI: IMPRESSION: 1. Interval PLIF at L3-4 and L4-5 with decompression of the central canal and foramina bilaterally. 2. Progressive adjacent level disease at L2-3 with now moderate to severe central canal stenosis and moderate foraminal narrowing bilaterally. 3. Leftward disc protrusion and annular tear at L5-S1 with potential contact of the left S1 nerve root. The foramina are patent. 4. Stable mild central and bilateral foraminal narrowing at L1-2.  He had recent labs at Dr. Kenton Kingfisher' office on 09/01/15. CBC with diff was WNL except HGB was mildly low at 13.4. HCT normal at 42.8. CMET was WNL except ALP slightly low at 39. Cr 1.02. Glucose 105. PT 11.9, INR 1.1. PTT 31. T&S and A1C done today. A1C results pending.  Discussed above with anesthesiologist Dr. Annye Asa. Patient with known CAD without recent cardiology follow-up or functional testing. He gets back pain with raking leaves (has to rest at ~ 30 min) which would correlate with MRI findings, but difficult to know if his severe back pain could be masking any cardiac symptoms.  Recommend pre-operative stress test either with local cardiologist or in [place]. I called and spoke with Dr. Kenton Kingfisher. He will work on getting this arranged and will speak further with patient tomorrow at his appointment. I did call and notify patient and updated Lorriane Shire at Dr. Windy Carina office.   If stress test results are okay then would  anticipate that he could proceed as planned. Chart will be left for follow-up stress test results.  George Hugh [facility] Short Stay Center/Anesthesiology Phone [phone] Fax [phone] 09/04/2015 5:10 PM  Addendum: Patient was referred to cardiologist Dr. Rosalita Chessman in [place] for  pre-operative testing. Dr. Milta Deiters office note is pending, but nuclear stress test on 09/09/15 showed: Normal Lexiscan myocardial perfusion imaging study with no evidence of ischemia or prior infarction, EF 63%, no wall motion abnormalities.   Both Dr. Rosalita Chessman and Dr. Kenton Kingfisher have signed notes of clearance for this procedure. If no acute changes then I anticipate that he can proceed as planned.  George Hugh [facility] Short Stay Center/Anesthesiology Phone [phone] 09/11/2015 11:14 AM

## 2015-09-04 NOTE — Progress Notes (Signed)
PCP:Dr Quincy Simmonds :[facility] [phone] Pt. Has an appointment with Dr. Kenton Kingfisher 09/05/15, I've asked his office to fax last ekg and office note to [place].  Denies having a cardiologist, states Dr.Harris manages everything for him.  Dr. Holland Falling at [facility] :pain management.

## 2015-09-04 NOTE — Progress Notes (Signed)
   09/04/15 1341  OBSTRUCTIVE SLEEP APNEA  Have you ever been diagnosed with sleep apnea through a sleep study? No  Do you snore loudly (loud enough to be heard through closed doors)?  0  Do you often feel tired, fatigued, or sleepy during the daytime (such as falling asleep during driving or talking to someone)? 0  Has anyone observed you stop breathing during your sleep? 0  Do you have, or are you being treated for high blood pressure? 1  BMI more than 35 kg/m2? 1  Age > 50 (1-yes) 1  Neck circumference greater than:Male 16 inches or larger, Male 17inches or larger? 1  Male Gender (Yes=1) 1  Obstructive Sleep Apnea Score 5  Score 5 or greater  Results sent to PCP

## 2015-09-05 LAB — HEMOGLOBIN A1C
Hgb A1c MFr Bld: 6 % — ABNORMAL HIGH (ref 4.8–5.6)
MEAN PLASMA GLUCOSE: 126 mg/dL

## 2015-09-11 MED ORDER — DEXAMETHASONE SODIUM PHOSPHATE 10 MG/ML IJ SOLN
10.0000 mg | INTRAMUSCULAR | Status: AC
  Administered 2015-09-12: 10 mg via INTRAVENOUS
  Filled 2015-09-11: qty 1

## 2015-09-11 MED ORDER — VANCOMYCIN HCL 10 G IV SOLR
1500.0000 mg | INTRAVENOUS | Status: AC
  Administered 2015-09-12: 1500 mg via INTRAVENOUS
  Filled 2015-09-11: qty 1500

## 2015-09-12 ENCOUNTER — Inpatient Hospital Stay (HOSPITAL_COMMUNITY)
Admission: RE | Admit: 2015-09-12 | Discharge: 2015-09-15 | DRG: 460 | Disposition: A | Discharge status: Home / Self Care | Payer: Medicare Other | Source: Ambulatory Visit | Attending: Neurosurgery | Admitting: Neurosurgery

## 2015-09-12 ENCOUNTER — Inpatient Hospital Stay (HOSPITAL_COMMUNITY): Payer: Medicare Other

## 2015-09-12 ENCOUNTER — Encounter (HOSPITAL_COMMUNITY)
Admission: RE | Disposition: A | Discharge status: Home / Self Care | Payer: Medicare Other | Source: Ambulatory Visit | Attending: Neurosurgery

## 2015-09-12 ENCOUNTER — Inpatient Hospital Stay (HOSPITAL_COMMUNITY): Payer: Medicare Other | Admitting: Vascular Surgery

## 2015-09-12 ENCOUNTER — Inpatient Hospital Stay (HOSPITAL_COMMUNITY): Payer: Medicare Other | Admitting: Certified Registered"

## 2015-09-12 ENCOUNTER — Encounter (HOSPITAL_COMMUNITY): Payer: Self-pay | Admitting: *Deleted

## 2015-09-12 DIAGNOSIS — Z419 Encounter for procedure for purposes other than remedying health state, unspecified: Secondary | ICD-10-CM

## 2015-09-12 DIAGNOSIS — E119 Type 2 diabetes mellitus without complications: Secondary | ICD-10-CM | POA: Diagnosis present

## 2015-09-12 DIAGNOSIS — Z7982 Long term (current) use of aspirin: Secondary | ICD-10-CM | POA: Diagnosis not present

## 2015-09-12 DIAGNOSIS — M79606 Pain in leg, unspecified: Secondary | ICD-10-CM | POA: Diagnosis present

## 2015-09-12 DIAGNOSIS — Z96652 Presence of left artificial knee joint: Secondary | ICD-10-CM | POA: Diagnosis present

## 2015-09-12 DIAGNOSIS — G4700 Insomnia, unspecified: Secondary | ICD-10-CM | POA: Diagnosis present

## 2015-09-12 DIAGNOSIS — M549 Dorsalgia, unspecified: Secondary | ICD-10-CM | POA: Diagnosis present

## 2015-09-12 DIAGNOSIS — I10 Essential (primary) hypertension: Secondary | ICD-10-CM | POA: Diagnosis present

## 2015-09-12 DIAGNOSIS — E785 Hyperlipidemia, unspecified: Secondary | ICD-10-CM | POA: Diagnosis present

## 2015-09-12 DIAGNOSIS — Z79899 Other long term (current) drug therapy: Secondary | ICD-10-CM | POA: Diagnosis not present

## 2015-09-12 DIAGNOSIS — Z880 Allergy status to penicillin: Secondary | ICD-10-CM | POA: Diagnosis not present

## 2015-09-12 DIAGNOSIS — Z87891 Personal history of nicotine dependence: Secondary | ICD-10-CM | POA: Diagnosis not present

## 2015-09-12 DIAGNOSIS — G8929 Other chronic pain: Secondary | ICD-10-CM | POA: Diagnosis present

## 2015-09-12 DIAGNOSIS — F419 Anxiety disorder, unspecified: Secondary | ICD-10-CM | POA: Diagnosis present

## 2015-09-12 DIAGNOSIS — E876 Hypokalemia: Secondary | ICD-10-CM | POA: Diagnosis present

## 2015-09-12 DIAGNOSIS — F329 Major depressive disorder, single episode, unspecified: Secondary | ICD-10-CM | POA: Diagnosis present

## 2015-09-12 DIAGNOSIS — Z981 Arthrodesis status: Secondary | ICD-10-CM

## 2015-09-12 DIAGNOSIS — J45909 Unspecified asthma, uncomplicated: Secondary | ICD-10-CM | POA: Diagnosis present

## 2015-09-12 DIAGNOSIS — M48061 Spinal stenosis, lumbar region without neurogenic claudication: Secondary | ICD-10-CM | POA: Diagnosis present

## 2015-09-12 DIAGNOSIS — K5900 Constipation, unspecified: Secondary | ICD-10-CM | POA: Diagnosis present

## 2015-09-12 DIAGNOSIS — M797 Fibromyalgia: Secondary | ICD-10-CM | POA: Diagnosis present

## 2015-09-12 DIAGNOSIS — I2510 Atherosclerotic heart disease of native coronary artery without angina pectoris: Secondary | ICD-10-CM | POA: Diagnosis present

## 2015-09-12 DIAGNOSIS — M4806 Spinal stenosis, lumbar region: Principal | ICD-10-CM | POA: Diagnosis present

## 2015-09-12 DIAGNOSIS — Z951 Presence of aortocoronary bypass graft: Secondary | ICD-10-CM

## 2015-09-12 LAB — GLUCOSE, CAPILLARY
GLUCOSE-CAPILLARY: 107 mg/dL — AB (ref 65–99)
GLUCOSE-CAPILLARY: 119 mg/dL — AB (ref 65–99)

## 2015-09-12 SURGERY — POSTERIOR LUMBAR FUSION 2 WITH HARDWARE REMOVAL
Anesthesia: General | Site: Spine Lumbar

## 2015-09-12 MED ORDER — METHADONE HCL 5 MG PO TABS
5.0000 mg | ORAL_TABLET | Freq: Three times a day (TID) | ORAL | Status: DC
  Administered 2015-09-12 – 2015-09-15 (×8): 5 mg via ORAL
  Filled 2015-09-12 (×8): qty 1

## 2015-09-12 MED ORDER — LISINOPRIL-HYDROCHLOROTHIAZIDE 20-25 MG PO TABS: 1.0000 | ORAL_TABLET | Freq: Every morning | ORAL | Status: DC

## 2015-09-12 MED ORDER — MIDAZOLAM HCL 5 MG/5ML IJ SOLN
INTRAMUSCULAR | Status: DC | PRN
  Administered 2015-09-12: 2 mg via INTRAVENOUS

## 2015-09-12 MED ORDER — ACETAMINOPHEN 10 MG/ML IV SOLN
1000.0000 mg | Freq: Four times a day (QID) | INTRAVENOUS | Status: DC
  Administered 2015-09-12: 1000 mg via INTRAVENOUS

## 2015-09-12 MED ORDER — MEPERIDINE HCL 25 MG/ML IJ SOLN
6.2500 mg | INTRAMUSCULAR | Status: DC | PRN
  Administered 2015-09-12: 12.5 mg via INTRAVENOUS

## 2015-09-12 MED ORDER — SENNA 8.6 MG PO TABS
2.0000 | ORAL_TABLET | Freq: Every day | ORAL | Status: DC
  Administered 2015-09-12 – 2015-09-14 (×3): 17.2 mg via ORAL
  Filled 2015-09-12 (×3): qty 2

## 2015-09-12 MED ORDER — PROMETHAZINE HCL 25 MG/ML IJ SOLN
INTRAMUSCULAR | Status: AC
  Filled 2015-09-12: qty 1

## 2015-09-12 MED ORDER — VANCOMYCIN HCL 1000 MG IV SOLR
INTRAVENOUS | Status: DC | PRN
  Administered 2015-09-12: 1000 mg via TOPICAL

## 2015-09-12 MED ORDER — NEOSTIGMINE METHYLSULFATE 10 MG/10ML IV SOLN
INTRAVENOUS | Status: AC
  Filled 2015-09-12: qty 1

## 2015-09-12 MED ORDER — THROMBIN 20000 UNITS EX SOLR
CUTANEOUS | Status: DC | PRN
  Administered 2015-09-12: 11:00:00 via TOPICAL

## 2015-09-12 MED ORDER — NEOSTIGMINE METHYLSULFATE 10 MG/10ML IV SOLN
INTRAVENOUS | Status: DC | PRN
  Administered 2015-09-12: 3 mg via INTRAVENOUS

## 2015-09-12 MED ORDER — ROCURONIUM BROMIDE 100 MG/10ML IV SOLN
INTRAVENOUS | Status: DC | PRN
  Administered 2015-09-12: 50 mg via INTRAVENOUS

## 2015-09-12 MED ORDER — DOCUSATE SODIUM 100 MG PO CAPS
100.0000 mg | ORAL_CAPSULE | Freq: Two times a day (BID) | ORAL | Status: DC
Start: 2015-09-12 — End: 2015-09-15
  Administered 2015-09-12 – 2015-09-15 (×6): 100 mg via ORAL
  Filled 2015-09-12 (×6): qty 1

## 2015-09-12 MED ORDER — OXYCODONE HCL 5 MG PO TABS: 10.0000 mg | ORAL_TABLET | Freq: Three times a day (TID) | ORAL | Status: DC | PRN

## 2015-09-12 MED ORDER — SIMVASTATIN 40 MG PO TABS
40.0000 mg | ORAL_TABLET | Freq: Every day | ORAL | Status: DC
  Administered 2015-09-12 – 2015-09-14 (×3): 40 mg via ORAL
  Filled 2015-09-12 (×3): qty 1

## 2015-09-12 MED ORDER — HYDROMORPHONE HCL 1 MG/ML IJ SOLN
INTRAMUSCULAR | Status: AC
  Filled 2015-09-12: qty 1

## 2015-09-12 MED ORDER — GLYCOPYRROLATE 0.2 MG/ML IJ SOLN
INTRAMUSCULAR | Status: AC
  Filled 2015-09-12: qty 2

## 2015-09-12 MED ORDER — HYDROMORPHONE HCL 1 MG/ML IJ SOLN
0.2500 mg | INTRAMUSCULAR | Status: DC | PRN
  Administered 2015-09-12 (×3): 0.5 mg via INTRAVENOUS

## 2015-09-12 MED ORDER — PROMETHAZINE HCL 25 MG/ML IJ SOLN
6.2500 mg | INTRAMUSCULAR | Status: DC | PRN
  Administered 2015-09-12: 6.25 mg via INTRAVENOUS

## 2015-09-12 MED ORDER — HYDROCHLOROTHIAZIDE 25 MG PO TABS
25.0000 mg | ORAL_TABLET | Freq: Every day | ORAL | Status: DC
  Administered 2015-09-13 – 2015-09-15 (×3): 25 mg via ORAL
  Filled 2015-09-12 (×3): qty 1

## 2015-09-12 MED ORDER — CYCLOBENZAPRINE HCL 10 MG PO TABS
10.0000 mg | ORAL_TABLET | Freq: Three times a day (TID) | ORAL | Status: DC | PRN
  Administered 2015-09-12: 10 mg via ORAL
  Filled 2015-09-12: qty 1

## 2015-09-12 MED ORDER — PHENOL 1.4 % MT LIQD
1.0000 | OROMUCOSAL | Status: DC | PRN
  Administered 2015-09-12: 1 via OROMUCOSAL
  Filled 2015-09-12: qty 177

## 2015-09-12 MED ORDER — PROPOFOL 10 MG/ML IV BOLUS
INTRAVENOUS | Status: DC | PRN
  Administered 2015-09-12: 200 mg via INTRAVENOUS

## 2015-09-12 MED ORDER — OXYCODONE-ACETAMINOPHEN 5-325 MG PO TABS
1.0000 | ORAL_TABLET | ORAL | Status: DC | PRN
  Administered 2015-09-12: 2 via ORAL
  Filled 2015-09-12: qty 2

## 2015-09-12 MED ORDER — LACTATED RINGERS IV SOLN
INTRAVENOUS | Status: DC
  Administered 2015-09-12 (×2): via INTRAVENOUS

## 2015-09-12 MED ORDER — LIDOCAINE-EPINEPHRINE 1 %-1:100000 IJ SOLN
INTRAMUSCULAR | Status: DC | PRN
  Administered 2015-09-12: 10 mL

## 2015-09-12 MED ORDER — MENTHOL 3 MG MT LOZG: 1.0000 | LOZENGE | OROMUCOSAL | Status: DC | PRN

## 2015-09-12 MED ORDER — POTASSIUM CHLORIDE IN NACL 20-0.9 MEQ/L-% IV SOLN
INTRAVENOUS | Status: DC
  Administered 2015-09-12: 19:00:00 via INTRAVENOUS
  Filled 2015-09-12 (×7): qty 1000

## 2015-09-12 MED ORDER — BACITRACIN 50000 UNITS IM SOLR
INTRAMUSCULAR | Status: DC | PRN
  Administered 2015-09-12: 11:00:00

## 2015-09-12 MED ORDER — BUPIVACAINE LIPOSOME 1.3 % IJ SUSP
INTRAMUSCULAR | Status: DC | PRN
  Administered 2015-09-12: 20 mL

## 2015-09-12 MED ORDER — SODIUM CHLORIDE 0.9 % IJ SOLN: 3.0000 mL | INTRAMUSCULAR | Status: DC | PRN

## 2015-09-12 MED ORDER — MORPHINE SULFATE 15 MG PO TABS
30.0000 mg | ORAL_TABLET | ORAL | Status: DC | PRN
  Administered 2015-09-14 – 2015-09-15 (×7): 30 mg via ORAL
  Filled 2015-09-12 (×8): qty 2

## 2015-09-12 MED ORDER — VANCOMYCIN HCL 10 G IV SOLR
2000.0000 mg | Freq: Once | INTRAVENOUS | Status: AC
  Administered 2015-09-12: 2000 mg via INTRAVENOUS
  Filled 2015-09-12: qty 2000

## 2015-09-12 MED ORDER — DEXAMETHASONE SODIUM PHOSPHATE 10 MG/ML IJ SOLN
INTRAMUSCULAR | Status: AC
  Filled 2015-09-12: qty 1

## 2015-09-12 MED ORDER — LISINOPRIL 20 MG PO TABS
20.0000 mg | ORAL_TABLET | Freq: Every day | ORAL | Status: DC
  Administered 2015-09-13 – 2015-09-15 (×2): 20 mg via ORAL
  Filled 2015-09-12 (×3): qty 1

## 2015-09-12 MED ORDER — METHADONE HCL 10 MG PO TABS
10.0000 mg | ORAL_TABLET | Freq: Three times a day (TID) | ORAL | Status: DC | PRN
  Administered 2015-09-14: 10 mg via ORAL
  Filled 2015-09-12: qty 1

## 2015-09-12 MED ORDER — TRAZODONE HCL 50 MG PO TABS
250.0000 mg | ORAL_TABLET | Freq: Every day | ORAL | Status: DC
  Administered 2015-09-12 – 2015-09-14 (×3): 250 mg via ORAL
  Filled 2015-09-12 (×3): qty 2

## 2015-09-12 MED ORDER — ADULT MULTIVITAMIN W/MINERALS CH
1.0000 | ORAL_TABLET | Freq: Every day | ORAL | Status: DC
  Administered 2015-09-12 – 2015-09-15 (×4): 1 via ORAL
  Filled 2015-09-12 (×7): qty 1

## 2015-09-12 MED ORDER — VECURONIUM BROMIDE 10 MG IV SOLR
INTRAVENOUS | Status: AC
  Filled 2015-09-12: qty 10

## 2015-09-12 MED ORDER — ACETAMINOPHEN 500 MG PO TABS: 500.0000 mg | ORAL_TABLET | Freq: Every day | ORAL | Status: DC | PRN

## 2015-09-12 MED ORDER — PROPOFOL 10 MG/ML IV BOLUS
INTRAVENOUS | Status: AC
  Filled 2015-09-12: qty 20

## 2015-09-12 MED ORDER — NAPROXEN 250 MG PO TABS
500.0000 mg | ORAL_TABLET | Freq: Two times a day (BID) | ORAL | Status: DC
  Administered 2015-09-12 – 2015-09-15 (×6): 500 mg via ORAL
  Filled 2015-09-12 (×6): qty 2

## 2015-09-12 MED ORDER — MEPERIDINE HCL 25 MG/ML IJ SOLN
INTRAMUSCULAR | Status: AC
  Filled 2015-09-12: qty 1

## 2015-09-12 MED ORDER — GLYCOPYRROLATE 0.2 MG/ML IJ SOLN
INTRAMUSCULAR | Status: DC | PRN
  Administered 2015-09-12: 0.4 mg via INTRAVENOUS

## 2015-09-12 MED ORDER — VANCOMYCIN HCL 1000 MG IV SOLR
INTRAVENOUS | Status: AC
  Filled 2015-09-12: qty 1000

## 2015-09-12 MED ORDER — ACETAMINOPHEN 325 MG PO TABS: 650.0000 mg | ORAL_TABLET | ORAL | Status: DC | PRN

## 2015-09-12 MED ORDER — ONDANSETRON HCL 4 MG/2ML IJ SOLN
INTRAMUSCULAR | Status: DC | PRN
  Administered 2015-09-12: 4 mg via INTRAVENOUS

## 2015-09-12 MED ORDER — SODIUM CHLORIDE 0.9 % IJ SOLN
3.0000 mL | Freq: Two times a day (BID) | INTRAMUSCULAR | Status: DC
  Administered 2015-09-12 – 2015-09-14 (×2): 3 mL via INTRAVENOUS

## 2015-09-12 MED ORDER — CYCLOBENZAPRINE HCL 10 MG PO TABS
10.0000 mg | ORAL_TABLET | Freq: Three times a day (TID) | ORAL | Status: DC | PRN
Start: 2015-09-12 — End: 2015-09-15
  Filled 2015-09-12: qty 1

## 2015-09-12 MED ORDER — VECURONIUM BROMIDE 10 MG IV SOLR
INTRAVENOUS | Status: DC | PRN
  Administered 2015-09-12: 1 mg via INTRAVENOUS
  Administered 2015-09-12: 2 mg via INTRAVENOUS
  Administered 2015-09-12 (×3): 1 mg via INTRAVENOUS
  Administered 2015-09-12: 2 mg via INTRAVENOUS
  Administered 2015-09-12 (×2): 1 mg via INTRAVENOUS
  Administered 2015-09-12 (×2): 2 mg via INTRAVENOUS

## 2015-09-12 MED ORDER — LIDOCAINE HCL (CARDIAC) 20 MG/ML IV SOLN
INTRAVENOUS | Status: AC
  Filled 2015-09-12: qty 5

## 2015-09-12 MED ORDER — HYDROMORPHONE HCL 1 MG/ML IJ SOLN
0.5000 mg | INTRAMUSCULAR | Status: DC | PRN
  Administered 2015-09-12 – 2015-09-13 (×7): 1 mg via INTRAVENOUS
  Filled 2015-09-12 (×7): qty 1

## 2015-09-12 MED ORDER — ASPIRIN EC 325 MG PO TBEC
325.0000 mg | DELAYED_RELEASE_TABLET | Freq: Every day | ORAL | Status: DC
  Administered 2015-09-12 – 2015-09-15 (×4): 325 mg via ORAL
  Filled 2015-09-12 (×4): qty 1

## 2015-09-12 MED ORDER — SODIUM CHLORIDE 0.9 % IJ SOLN
INTRAMUSCULAR | Status: AC
  Filled 2015-09-12: qty 10

## 2015-09-12 MED ORDER — ACETAMINOPHEN 10 MG/ML IV SOLN
INTRAVENOUS | Status: AC
  Filled 2015-09-12: qty 100

## 2015-09-12 MED ORDER — POTASSIUM GLUCONATE 595 (99 K) MG PO TABS: 595.0000 mg | ORAL_TABLET | Freq: Every day | ORAL | Status: DC

## 2015-09-12 MED ORDER — ACETAMINOPHEN 650 MG RE SUPP: 650.0000 mg | RECTAL | Status: DC | PRN

## 2015-09-12 MED ORDER — ALBUTEROL SULFATE (2.5 MG/3ML) 0.083% IN NEBU: 2.5000 mg | INHALATION_SOLUTION | Freq: Four times a day (QID) | RESPIRATORY_TRACT | Status: DC | PRN

## 2015-09-12 MED ORDER — MIDAZOLAM HCL 2 MG/2ML IJ SOLN
INTRAMUSCULAR | Status: AC
  Filled 2015-09-12: qty 2

## 2015-09-12 MED ORDER — ROCURONIUM BROMIDE 50 MG/5ML IV SOLN
INTRAVENOUS | Status: AC
  Filled 2015-09-12: qty 1

## 2015-09-12 MED ORDER — ALPRAZOLAM 0.5 MG PO TABS
0.5000 mg | ORAL_TABLET | Freq: Three times a day (TID) | ORAL | Status: DC | PRN
  Administered 2015-09-12 – 2015-09-14 (×4): 0.5 mg via ORAL
  Filled 2015-09-12 (×3): qty 1

## 2015-09-12 MED ORDER — SUFENTANIL CITRATE 50 MCG/ML IV SOLN
INTRAVENOUS | Status: AC
  Filled 2015-09-12: qty 1

## 2015-09-12 MED ORDER — ONDANSETRON HCL 4 MG/2ML IJ SOLN: 4.0000 mg | INTRAMUSCULAR | Status: DC | PRN

## 2015-09-12 MED ORDER — VANCOMYCIN HCL IN DEXTROSE 1-5 GM/200ML-% IV SOLN
1000.0000 mg | Freq: Two times a day (BID) | INTRAVENOUS | Status: DC
  Administered 2015-09-13 – 2015-09-15 (×5): 1000 mg via INTRAVENOUS
  Filled 2015-09-12 (×7): qty 200

## 2015-09-12 MED ORDER — SODIUM CHLORIDE 0.9 % IV SOLN: 250.0000 mL | INTRAVENOUS | Status: DC

## 2015-09-12 MED ORDER — LIDOCAINE HCL (CARDIAC) 20 MG/ML IV SOLN
INTRAVENOUS | Status: DC | PRN
  Administered 2015-09-12: 100 mg via INTRAVENOUS

## 2015-09-12 MED ORDER — PHENYLEPHRINE HCL 10 MG/ML IJ SOLN
10.0000 mg | INTRAMUSCULAR | Status: DC | PRN
  Administered 2015-09-12: 10 ug/min via INTRAVENOUS

## 2015-09-12 MED ORDER — HYDROMORPHONE HCL 1 MG/ML IJ SOLN
0.2500 mg | INTRAMUSCULAR | Status: DC | PRN
  Administered 2015-09-12 (×5): 0.5 mg via INTRAVENOUS

## 2015-09-12 MED ORDER — SUCCINYLCHOLINE CHLORIDE 20 MG/ML IJ SOLN
INTRAMUSCULAR | Status: AC
  Filled 2015-09-12: qty 1

## 2015-09-12 MED ORDER — SUFENTANIL CITRATE 50 MCG/ML IV SOLN
INTRAVENOUS | Status: DC | PRN
  Administered 2015-09-12 (×2): 10 ug via INTRAVENOUS
  Administered 2015-09-12: 20 ug via INTRAVENOUS

## 2015-09-12 MED ORDER — ONDANSETRON HCL 4 MG/2ML IJ SOLN
INTRAMUSCULAR | Status: AC
  Filled 2015-09-12: qty 2

## 2015-09-12 MED ORDER — 0.9 % SODIUM CHLORIDE (POUR BTL) OPTIME
TOPICAL | Status: DC | PRN
  Administered 2015-09-12: 1000 mL

## 2015-09-12 MED ORDER — BUPIVACAINE LIPOSOME 1.3 % IJ SUSP
20.0000 mL | INTRAMUSCULAR | Status: AC
  Filled 2015-09-12: qty 20

## 2015-09-12 SURGICAL SUPPLY — 70 items
ALLOSTEM STRIP 20MMX50MM (Tissue) ×3 IMPLANT
BAG DECANTER FOR FLEXI CONT (MISCELLANEOUS) ×3 IMPLANT
BENZOIN TINCTURE PRP APPL 2/3 (GAUZE/BANDAGES/DRESSINGS) ×3 IMPLANT
BLADE CLIPPER SURG (BLADE) IMPLANT
BLADE SURG 11 STRL SS (BLADE) ×3 IMPLANT
BONE ALLOSTEM MORSELIZED 5CC (Bone Implant) ×6 IMPLANT
BRUSH SCRUB EZ PLAIN DRY (MISCELLANEOUS) ×3 IMPLANT
BUR MATCHSTICK NEURO 3.0 LAGG (BURR) ×3 IMPLANT
BUR PRECISION FLUTE 6.0 (BURR) ×3 IMPLANT
CAGE RISE 11-17-15 10X26 (Cage) ×6 IMPLANT
CANISTER SUCT 3000ML PPV (MISCELLANEOUS) ×3 IMPLANT
CAP LOCKING THREADED (Cap) ×30 IMPLANT
CLOSURE WOUND 1/2 X4 (GAUZE/BANDAGES/DRESSINGS) ×1
CONT SPEC 4OZ CLIKSEAL STRL BL (MISCELLANEOUS) ×3 IMPLANT
COVER BACK TABLE 24X17X13 BIG (DRAPES) IMPLANT
COVER BACK TABLE 60X90IN (DRAPES) ×3 IMPLANT
CROSSLINK SPINAL FUSION (Cage) ×3 IMPLANT
DECANTER SPIKE VIAL GLASS SM (MISCELLANEOUS) ×3 IMPLANT
DRAPE C-ARM 42X72 X-RAY (DRAPES) ×3 IMPLANT
DRAPE C-ARMOR (DRAPES) ×3 IMPLANT
DRAPE LAPAROTOMY 100X72X124 (DRAPES) ×3 IMPLANT
DRAPE POUCH INSTRU U-SHP 10X18 (DRAPES) ×3 IMPLANT
DRAPE PROXIMA HALF (DRAPES) IMPLANT
DRAPE SURG 17X23 STRL (DRAPES) ×3 IMPLANT
DRSG OPSITE 4X5.5 SM (GAUZE/BANDAGES/DRESSINGS) ×3 IMPLANT
DRSG OPSITE POSTOP 4X10 (GAUZE/BANDAGES/DRESSINGS) ×3 IMPLANT
DURAPREP 26ML APPLICATOR (WOUND CARE) ×3 IMPLANT
ELECT REM PT RETURN 9FT ADLT (ELECTROSURGICAL) ×3
ELECTRODE REM PT RTRN 9FT ADLT (ELECTROSURGICAL) ×1 IMPLANT
EVACUATOR 3/16  PVC DRAIN (DRAIN) ×2
EVACUATOR 3/16 PVC DRAIN (DRAIN) ×1 IMPLANT
GAUZE SPONGE 4X4 12PLY STRL (GAUZE/BANDAGES/DRESSINGS) ×3 IMPLANT
GAUZE SPONGE 4X4 16PLY XRAY LF (GAUZE/BANDAGES/DRESSINGS) IMPLANT
GLOVE BIO SURGEON STRL SZ8 (GLOVE) ×6 IMPLANT
GLOVE BIOGEL M 8.0 STRL (GLOVE) ×3 IMPLANT
GLOVE EXAM NITRILE LRG STRL (GLOVE) IMPLANT
GLOVE EXAM NITRILE MD LF STRL (GLOVE) IMPLANT
GLOVE EXAM NITRILE XL STR (GLOVE) IMPLANT
GLOVE EXAM NITRILE XS STR PU (GLOVE) IMPLANT
GLOVE INDICATOR 8.5 STRL (GLOVE) ×6 IMPLANT
GLOVE SURG SS PI 6.5 STRL IVOR (GLOVE) ×3 IMPLANT
GOWN STRL REUS W/ TWL LRG LVL3 (GOWN DISPOSABLE) IMPLANT
GOWN STRL REUS W/ TWL XL LVL3 (GOWN DISPOSABLE) ×3 IMPLANT
GOWN STRL REUS W/TWL 2XL LVL3 (GOWN DISPOSABLE) IMPLANT
GOWN STRL REUS W/TWL LRG LVL3 (GOWN DISPOSABLE)
GOWN STRL REUS W/TWL XL LVL3 (GOWN DISPOSABLE) ×6
KIT BASIN OR (CUSTOM PROCEDURE TRAY) ×3 IMPLANT
KIT INFUSE SMALL (Orthopedic Implant) ×3 IMPLANT
KIT ROOM TURNOVER OR (KITS) ×3 IMPLANT
LIQUID BAND (GAUZE/BANDAGES/DRESSINGS) ×3 IMPLANT
NEEDLE HYPO 21X1.5 SAFETY (NEEDLE) ×3 IMPLANT
NEEDLE HYPO 25X1 1.5 SAFETY (NEEDLE) ×3 IMPLANT
NS IRRIG 1000ML POUR BTL (IV SOLUTION) ×3 IMPLANT
PACK LAMINECTOMY NEURO (CUSTOM PROCEDURE TRAY) ×3 IMPLANT
PAD ARMBOARD 7.5X6 YLW CONV (MISCELLANEOUS) ×9 IMPLANT
ROD (Rod) ×3 IMPLANT
SCREW AMP MODULAR CREO 6.5X45 (Screw) ×12 IMPLANT
SCREW PA THRD CREO TULIP 5.5X4 (Head) ×12 IMPLANT
SPONGE LAP 4X18 X RAY DECT (DISPOSABLE) IMPLANT
SPONGE SURGIFOAM ABS GEL 100 (HEMOSTASIS) ×3 IMPLANT
STRIP CLOSURE SKIN 1/2X4 (GAUZE/BANDAGES/DRESSINGS) ×2 IMPLANT
SUT VIC AB 0 CT1 18XCR BRD8 (SUTURE) ×1 IMPLANT
SUT VIC AB 0 CT1 8-18 (SUTURE) ×2
SUT VIC AB 2-0 CT1 18 (SUTURE) ×3 IMPLANT
SUT VIC AB 4-0 PS2 27 (SUTURE) ×3 IMPLANT
SYR 20CC LL (SYRINGE) ×3 IMPLANT
TOWEL OR 17X24 6PK STRL BLUE (TOWEL DISPOSABLE) ×3 IMPLANT
TOWEL OR 17X26 10 PK STRL BLUE (TOWEL DISPOSABLE) ×3 IMPLANT
TRAY FOLEY W/METER SILVER 14FR (SET/KITS/TRAYS/PACK) ×3 IMPLANT
WATER STERILE IRR 1000ML POUR (IV SOLUTION) ×3 IMPLANT

## 2015-09-12 NOTE — Evaluation (Signed)
Physical Therapy Evaluation Patient Details Name: Michael Barr MRN: [id number] DOB: Sep 29, 1946 Today's Date: 09/12/2015   History of Present Illness  69 y.o. male s/p decompressive lumbar laminectomy L1-L2 with foraminotomies of the L1 and L2 nerve roots, decompressive lumbar laminectomy L2-3, pedicle screw fixation L1-L5, and posterior lateral arthrodesis L1-L3. Hx of CAD, HTN, and multiple back surgeries.  Clinical Impression  Patient is seen following the above procedure and presents with functional limitations due to the deficits listed below (see PT Problem List). Very pleasant individual. Demonstrates good strength and understanding of safe mobility. Supervision with gait, lightly using the rolling walker for support. Anticipate he will progress very quickly as he is motivated to improve his function. Follow up for stair training at next visit. Patient will benefit from skilled PT to increase their independence and safety with mobility to allow discharge to the venue listed below.       Follow Up Recommendations No PT follow up;Supervision - Intermittent    Equipment Recommendations  None recommended by PT    Recommendations for Other Services OT consult     Precautions / Restrictions Precautions Precautions: Back Precaution Booklet Issued: Yes (comment) Precaution Comments: reviewed handout Required Braces or Orthoses: Spinal Brace Spinal Brace: Applied in sitting position;Lumbar corset Restrictions Weight Bearing Restrictions: No      Mobility  Bed Mobility Overal bed mobility: Needs Assistance Bed Mobility: Rolling;Sidelying to Sit;Sit to Sidelying Rolling: Min guard Sidelying to sit: Min guard     Sit to sidelying: Min guard General bed mobility comments: Min guard for safety with education for log roll technique and cues throughout. Performed without rail or physical assist.  Transfers Overall transfer level: Needs assistance Equipment used: Rolling walker  (2 wheeled) Transfers: Sit to/from Stand Sit to Stand: Supervision         General transfer comment: Supervision for safety. Demonstrates good LE strength. VC for hand placement and to maintain neutral back alignment.  Ambulation/Gait Ambulation/Gait assistance: Supervision Ambulation Distance (Feet): 80 Feet Assistive device: Rolling walker (2 wheeled) Gait Pattern/deviations: Step-through pattern;Decreased step length - right;Decreased stance time - left;Decreased stride length;Trunk flexed Gait velocity: decreased Gait velocity interpretation: Below normal speed for age/gender General Gait Details: Educated on proper and safe DME use with a rolling walker. VC for upright posture and walker placement for proximity. Favors RLE. No buckling or loss of balance noted during this bout of ambulation.   Stairs            Wheelchair Mobility    Modified Rankin (Stroke Patients Only)       Balance Overall balance assessment: Needs assistance Sitting-balance support: No upper extremity supported;Feet supported Sitting balance-Leahy Scale: Good     Standing balance support: No upper extremity supported Standing balance-Leahy Scale: Fair                               Pertinent Vitals/Pain Pain Assessment: 0-10 Pain Score: 7  Pain Location: back Pain Descriptors / Indicators: Aching Pain Intervention(s): Monitored during session;Repositioned;Patient requesting pain meds-RN notified    Home Living Family/patient expects to be discharged to:: Private residence Living Arrangements: Spouse/significant other Available Help at Discharge: Family;Available 24 hours/day Type of Home: House Home Access: Stairs to enter Entrance Stairs-Rails:  (Wall on Rt) Entrance Stairs-Number of Steps: 2 Home Layout: One level Home Equipment: Walker - 4 wheels (3 in 1)      Prior Function Level of Independence: Independent with assistive  device(s)         Comments: used  rollator past 2 week leading up to surgery.     Hand Dominance   Dominant Hand: Right    Extremity/Trunk Assessment   Upper Extremity Assessment: Defer to OT evaluation           Lower Extremity Assessment: Overall WFL for tasks assessed         Communication   Communication: No difficulties  Cognition Arousal/Alertness: Awake/alert Behavior During Therapy: WFL for tasks assessed/performed Overall Cognitive Status: Within Functional Limits for tasks assessed                      General Comments General comments (skin integrity, edema, etc.): Wife present, very supportive.     Exercises        Assessment/Plan    PT Assessment Patient needs continued PT services  PT Diagnosis Abnormality of gait;Acute pain   PT Problem List Decreased range of motion;Decreased activity tolerance;Decreased balance;Decreased mobility;Decreased knowledge of use of DME;Decreased knowledge of precautions;Obesity;Pain  PT Treatment Interventions DME instruction;Gait training;Stair training;Functional mobility training;Therapeutic activities;Therapeutic exercise;Balance training;Neuromuscular re-education;Patient/family education;Modalities   PT Goals (Current goals can be found in the Care Plan section) Acute Rehab PT Goals Patient Stated Goal: No more surgery PT Goal Formulation: With patient Time For Goal Achievement: 09/26/15 Potential to Achieve Goals: Good    Frequency Min 5X/week   Barriers to discharge        Co-evaluation               End of Session Equipment Utilized During Treatment: Back brace Activity Tolerance: Patient tolerated treatment well Patient left: in bed;with call bell/phone within reach;with family/visitor present;with SCD's reapplied Nurse Communication: Mobility status;Patient requests pain meds         Time: [id number] PT Time Calculation (min) (ACUTE ONLY): 24 min   Charges:   PT Evaluation $Initial PT Evaluation Tier I: 1  Procedure PT Treatments $Therapeutic Activity: 8-22 mins   PT G Codes:        Ellouise Newer 09/12/2015, 6:28 PM Camille Bal [place], [place]

## 2015-09-12 NOTE — Anesthesia Preprocedure Evaluation (Signed)
Anesthesia Evaluation  Patient identified by MRN, date of birth, ID band Patient awake    Reviewed: Allergy & Precautions, H&P , NPO status , Patient's Chart, lab work & pertinent test results, reviewed documented beta blocker date and time   History of Anesthesia Complications Negative for: history of anesthetic complications  Airway Mallampati: III  TM Distance: >3 FB Neck ROM: Full    Dental  (+) Teeth Intact, Dental Advisory Given   Pulmonary asthma , former smoker,    Pulmonary exam normal        Cardiovascular hypertension, Pt. on medications + CAD  Normal cardiovascular exam Rhythm:Regular  I was given patient's chart to review after he had left his PAT visit.  I was not able to reach him this afternoon on his house phone.  According to his PAT RN, patient underwent CABG X 1 (questionable LIMA to LAD) for "widow maker" lesion.  His previous cardiologist retired, so now he is being followed by his Internist Dr. Kenton Kingfisher.  His last stress test was > 5 years ago. Dr. Kenton Kingfisher is aware of plans for surgery and saw patient on 09/04/13.  He did a preoperative CXR and EKG.  Patient was asymptomatic from a CAD standpoint and EKG was stable so no further testing was ordered.   EKG on 09/04/13 (Dr. [place]on Kingfisher) showed NSR, inferior Q waves that were stable since 2013.  No worrisome ST/T wave changes.    Neuro/Psych PSYCHIATRIC DISORDERS Anxiety Depression  Neuromuscular disease    GI/Hepatic negative GI ROS, Neg liver ROS,   Endo/Other  diabetesMorbid obesity  Renal/GU negative Renal ROS     Musculoskeletal  (+) Arthritis ,   Abdominal (+) + obese,   Peds  Hematology   Anesthesia Other Findings   Reproductive/Obstetrics                             Anesthesia Physical  Anesthesia Plan  ASA: III  Anesthesia Plan: General   Post-op Pain Management:    Induction: Intravenous  Airway Management  Planned: Oral ETT  Additional Equipment:   Intra-op Plan:   Post-operative Plan: Extubation in OR  Informed Consent: I have reviewed the patients History and Physical, chart, labs and discussed the procedure including the risks, benefits and alternatives for the proposed anesthesia with the patient or authorized representative who has indicated his/her understanding and acceptance.   Dental Advisory Given  Plan Discussed with: CRNA  Anesthesia Plan Comments:         Anesthesia Quick Evaluation                                  Anesthesia Evaluation  Patient identified by MRN, date of birth, ID band Patient awake    Reviewed: Allergy & Precautions, H&P , NPO status , Patient's Chart, lab work & pertinent test results  Airway Mallampati: III TM Distance: >3 FB Neck ROM: Full    Dental  (+) Dental Advisory Given and Teeth Intact   Pulmonary asthma , former smoker,          Cardiovascular hypertension, Pt. on medications + CAD and + CABG  09/18/13 Preop Visit:  Patient underwent CABG X 1 (questionable LIMA to LAD) for "widow maker" lesion.  His previous cardiologist retired, so now he is being followed by his Internist Dr. Kenton Kingfisher.  His last stress test was > 5 years ago.  Dr. Kenton Kingfisher is aware of plans for surgery and saw patient on 09/04/13.  He did a preoperative CXR and EKG.  Patient was asymptomatic from a CAD standpoint and EKG was stable.   Neuro/Psych PSYCHIATRIC DISORDERS Anxiety Depression    GI/Hepatic   Endo/Other  diabetes, Type 2Morbid obesity  Renal/GU      Musculoskeletal  (+) Arthritis -,   Abdominal   Peds  Hematology   Anesthesia Other Findings   Reproductive/Obstetrics                       Anesthesia Physical Anesthesia Plan  ASA: III  Anesthesia Plan: General   Post-op Pain Management:    Induction: Intravenous  Airway Management Planned: Oral ETT  Additional Equipment:   Intra-op Plan:    Post-operative Plan: Extubation in OR  Informed Consent: I have reviewed the patients History and Physical, chart, labs and discussed the procedure including the risks, benefits and alternatives for the proposed anesthesia with the patient or authorized representative who has indicated his/her understanding and acceptance.   Dental advisory given  Plan Discussed with: CRNA, Anesthesiologist and Surgeon  Anesthesia Plan Comments:         Anesthesia Quick Evaluation

## 2015-09-12 NOTE — Progress Notes (Signed)
Utilization review completed.  

## 2015-09-12 NOTE — Progress Notes (Signed)
Pt arrived to 5C08 via stretcher.  Pt ambulated to bathroom and back to bed without difficulty.  VSS.  States pain 7/10, repositioned in bed.  Will continue to monitor. Cori Razor, RN

## 2015-09-12 NOTE — Transfer of Care (Signed)
Immediate Anesthesia Transfer of Care Note  Patient: [name]  Procedure(s) Performed: Procedure(s): LUMBAR TWO-THREE POSTERIOR LUMBAR FUSION WITH LUMBAR ONE-TWO, LUMBAR TW-THREE POSTERIOR LATERAL ARTHRODESIS (N/A)  Patient Location: PACU  Anesthesia Type:General  Level of Consciousness: awake, oriented and patient cooperative  Airway & Oxygen Therapy: Patient Spontanous Breathing and Patient connected to nasal cannula oxygen  Post-op Assessment: Report given to RN, Post -op Vital signs reviewed and stable and Patient moving all extremities  Post vital signs: Reviewed and stable  Last Vitals:  Filed Vitals:   09/12/15 0831  BP: 132/62  Pulse: 70  Temp: 36.9 C  Resp: 20    Complications: No apparent anesthesia complications

## 2015-09-12 NOTE — Anesthesia Postprocedure Evaluation (Signed)
Anesthesia Post Note  Patient: Michael Barr  Procedure(s) Performed: Procedure(s) (LRB): LUMBAR TWO-THREE POSTERIOR LUMBAR FUSION WITH LUMBAR ONE-TWO, LUMBAR TW-THREE POSTERIOR LATERAL ARTHRODESIS (N/A)  Anesthesia type: General  Patient location: PACU  Post pain: Pain level controlled  Post assessment: Post-op Vital signs reviewed  Last Vitals: BP 126/67 mmHg  Pulse 91  Temp(Src) 36.6 C (Oral)  Resp 10  SpO2 100%  Post vital signs: Reviewed  Level of consciousness: sedated  Complications: No apparent anesthesia complications

## 2015-09-12 NOTE — Anesthesia Procedure Notes (Signed)
Procedure Name: Intubation Date/Time: 09/12/2015 10:10 AM Performed by: Melina Copa, Xaiden Fleig R Pre-anesthesia Checklist: Patient identified, Emergency Drugs available, Suction available, Patient being monitored and Timeout performed Patient Re-evaluated:Patient Re-evaluated prior to inductionOxygen Delivery Method: Circle system utilized Preoxygenation: Pre-oxygenation with 100% oxygen Intubation Type: IV induction Ventilation: Mask ventilation without difficulty Laryngoscope Size: Mac and 4 Grade View: Grade II Tube type: Oral Tube size: 7.5 mm Number of attempts: 1 Airway Equipment and Method: Stylet Placement Confirmation: ETT inserted through vocal cords under direct vision,  positive ETCO2 and breath sounds checked- equal and bilateral Secured at: 22 cm Tube secured with: Tape Dental Injury: Teeth and Oropharynx as per pre-operative assessment

## 2015-09-12 NOTE — H&P (Signed)
Michael Barr is an 69 y.o. male.   Chief Complaint: Back and leg pain HPI: Patient is a very pleasant 69 year old gentleman has had a previous L3-L5 fusion and patient did very well however over the last several months is a progress worsening back pain with radiation down both legs anterior medial quads workup has shown a large disc herniation at L2-3 above the level of his previous L3-5 fusion. Cause severe stenosis as well as moderate stenosis at L1-L2. Due to patient's failed conservative treatment imaging findings and progressive conical syndrome I recommended exploration of fusion removal of hardware L3-L5 with decompression interbody fusion L2-3 and decompression and pedicle screw fixation L1-L2 I've extensively gone over the risks and benefits of the procedure the patient as well as perioperative course expectations of outcome and alternatives of surgery and he understands and agrees to proceed forward.  Past Medical History  Diagnosis Date  . Depression     attacks  . Arthritis   . Coronary artery disease   . Hypertension     takes Zestoretic daily  . Hyperlipidemia     takes Zocor daily  . Asthma 70's  . Pneumonia 70's  . Weakness     right leg  . Fibromyalgia   . Joint pain   . Joint swelling   . Chronic back pain     HNP  . Constipation     takes Sennokot nightly  . Nocturia   . History of kidney stones   . Diabetes mellitus without complication (HCC)     borderline   . Hypokalemia     takes Potassium daily  . Cataract     immature and to the left eye  . Anxiety     takes Xanax and Methadone daily  . Insomnia     takes trazodone nightly    Past Surgical History  Procedure Laterality Date  . Shoulder arthroscopy Bilateral     left x 2 and right x 1  . Lumbar laminectomy/decompression microdiscectomy Right 09/26/2013    Procedure: LUMBAR LAMINECTOMY/DECOMPRESSION MICRODISCECTOMY RIGHT THREE-FOUR;  Surgeon: Elaina Hoops, MD;  Location: [facility];  Service:  Neurosurgery;  Laterality: Right;  . Coronary artery bypass graft  1996    1 vessel  . Joint replacement  1999    lt knee  . Cholecystectomy  1985  . Carpal tunnel release Bilateral 70's  . 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 Tonsillectomy    . Knee arthroscopy Right   . Pilondial cyst      x 2   . Colonoscopy      History reviewed. No pertinent family history. Social History:  reports that he has quit smoking. He does not have any smokeless tobacco history on file. He reports that he does not drink alcohol or use illicit drugs.  Allergies:  Allergies  Allergen Reactions  . Penicillins Rash    Has patient had a PCN reaction causing immediate rash, facial/tongue/throat swelling, SOB or lightheadedness with hypotension: No Has patient had a PCN reaction causing severe rash involving mucus membranes or skin necrosis: No Has patient had a PCN reaction that required hospitalization Yes Has patient had a PCN reaction occurring within the last 10 years: Yes If all of the above answers are "NO", then may proceed with Cephalosporin use.    Medications Prior to Admission  Medication Sig Dispense Refill  . acetaminophen (TYLENOL) 500 MG tablet Take 500 mg by mouth daily as needed for mild pain.     Marland Kitchen  albuterol (PROVENTIL HFA;VENTOLIN HFA) 108 (90 BASE) MCG/ACT inhaler Inhale 1 puff into the lungs every 6 (six) hours as needed for wheezing or shortness of breath.    . ALPRAZolam (XANAX) 0.5 MG tablet Take 0.5 mg by mouth 3 (three) times daily as needed for anxiety.    Marland Kitchen aspirin EC 325 MG tablet Take 325 mg by mouth daily.    Marland Kitchen lisinopril-hydrochlorothiazide (PRINZIDE,ZESTORETIC) 20-25 MG per tablet Take 1 tablet by mouth every morning.     . methadone (DOLOPHINE) 10 MG tablet Take 10 mg by mouth every 8 (eight) hours as needed (for anxiety and pain).     . methadone (DOLOPHINE) 5 MG tablet Take 5 mg by mouth every 8 (eight) hours.    . Multiple Vitamins-Minerals (MULTIVITAMIN WITH  MINERALS) tablet Take 1 tablet by mouth daily.    . naproxen (NAPROSYN) 500 MG tablet Take 500 mg by mouth 2 (two) times daily with a meal.    . OVER THE COUNTER MEDICATION Take 100 mg by mouth every morning. Take 200 mg in the morning    . Oxycodone HCl 10 MG TABS Take 10 mg by mouth 3 (three) times daily as needed (severe pain).    . potassium gluconate 595 MG TABS tablet Take 595 mg by mouth daily.    Marland Kitchen senna (SENOKOT) 8.6 MG TABS tablet Take 2 tablets by mouth at bedtime.    . simvastatin (ZOCOR) 40 MG tablet Take 40 mg by mouth at bedtime.    . traZODone (DESYREL) 100 MG tablet Take 250 mg by mouth at bedtime.    . cyclobenzaprine (FLEXERIL) 10 MG tablet Take 1 tablet (10 mg total) by mouth 3 (three) times daily as needed for muscle spasms. (Patient not taking: Reported on 09/04/2015) 80 tablet 1  . morphine (MSIR) 30 MG tablet Take 1 tablet (30 mg total) by mouth every 3 (three) hours as needed for moderate pain or severe pain. (Patient not taking: Reported on 09/04/2015) 80 tablet 0  . oxyCODONE 10 MG TABS Take 1 tablet (10 mg total) by mouth 3 (three) times daily as needed (severe pain). (Patient not taking: Reported on 09/04/2015) 80 tablet 0    Results for orders placed or performed during the hospital encounter of 09/12/15 (from the past 48 hour(s))  Glucose, capillary     Status: Abnormal   Collection Time: 09/12/15  8:37 AM  Result Value Ref Range   Glucose-Capillary 107 (H) 65 - 99 mg/dL   No results found.  Review of Systems  Constitutional: Negative.   HENT: Negative.   Eyes: Negative.   Respiratory: Negative.   Cardiovascular: Negative.   Gastrointestinal: Negative.   Genitourinary: Negative.   Musculoskeletal: Positive for myalgias and back pain.  Skin: Negative.   Neurological: Negative.   Psychiatric/Behavioral: Negative.     Blood pressure 132/62, pulse 70, temperature 98.4 F (36.9 C), temperature source Oral, resp. rate 20, SpO2 98 %. Physical Exam   Constitutional: He is oriented to person, place, and time. He appears well-developed and well-nourished.  HENT:  Head: Normocephalic.  Eyes: Pupils are equal, round, and reactive to light.  Neck: Normal range of motion.  Respiratory: Effort normal.  GI: Soft. Bowel sounds are normal.  Neurological: He is alert and oriented to person, place, and time. He has normal strength. GCS eye subscore is 4. GCS verbal subscore is 5. GCS motor subscore is 6.  Strength is 5 out of 5 in his iliopsoas, quads, hamstrings, gastrocs and into tibialis,  and EHL.     Assessment/Plan 69 year old gentleman presents for decompression and fusion L1-L2 L2-L3  Zeya Balles P 09/12/2015, 10:02 AM

## 2015-09-12 NOTE — Op Note (Signed)
Preoperative diagnosis: Herniated nuclear pulposus lumbar spinal stenosis degenerative disc disease and instability at L2-3 and lumbar spinal stenosis foraminal stenosis and radiculopathy L1-L2  Postoperative diagnosis: Same  Procedure: #1 decompressive lumbar laminectomy L1-L2 with foraminotomies of the L1 and L2 nerve roots.  #2 decompressive lumbar laminectomy L2-3 with complete me to facetectomies radical foraminotomies in excess and requiring more work to would be needed with a standard interbody fusion and posterior lumbar interbody fusion L2-3 using the globus rise expandable cage system with locally harvested autograft mixed with allostem morsels and BMP  #3 exploration of fusion removal of hardware L3-L5  #4 pedicle screw fixation L1-L5 with new pedicle screws placed at L1 and L2 tying into the old screws at L3-4 and 5 utilizing the Creo amp modular pedicle screw set  #5 posterior lateral arthrodesis L1-L3 utilizing locally harvested autograft mixed with allostem morsels and strips and BMP  #6 placement of large Hemovac drain  Surgeon: Dominica Severin Earnstine Meinders  Asst.: Leeroy Cha  Anesthesia: Gen.  EBL: Minimal  History of present illness: Patient is a very pleasant 69 year old gentleman who previously undergone L5-L3 lumbar fusion did very well. That was a couple years ago however last several months is a progress worsening back and bilateral leg pain consistent with an L2 and L3 nerve root pattern. Workup revealed severe spinal stenosis at L1-L2 and herniated nuclear pulposus and instability at L2-3. The patient today conservative treatment imaging findings progression of clinical syndrome I recommended reexpansion of fusion and a PLIFt L2-3 and a fusion at L1-L2 with decompression. I extensively went over the risks and benefits of that operation with the patient as well as perioperative course expectations of outcome and alternatives of surgery and he understands and agrees to proceed  forward  Operative procedure: Patient brought into the or was induced under general anesthesia positioned prone on the [place] table his back was prepped and draped in routine sterile fashion is old incision was opened up and extended cephalad subperiosteal dissections care and lamina of L1-L2 and L3 I laterally and then exposed the hardware from L3-L5. After adequate exposure and TPs of exposed the cross-link was removed the nuts were removed rods removed and the fusion did appear to be solid. At this point I removed the spinous processes at L1 L2 start central decompression was begun there was marked hourglass compression of the thecal sac at L2-3 as well as at L1-2 this is all aggressively under bitten complete medial facetectomies were performed at L2-3 with radical foraminotomies of the L2 and L3 nerve root. I also extended and under bitten to foraminotomies of the L1 nerve roots bilaterally. After again access lateral margins disc space epidural veins were cauterized disc spaces incised and cleanout bilaterally. The endplates and removing central disc. I selected the 10-17 expander cages packed with locally harvested autograft mixed with the morsels and inserted both cages with the autograft mixed between packed centrally. Opened up the cages and this opened up the disc space and significantly reduced the foraminal compression of the L2 nerve roots bilaterally. Extensive foraminotomies up to evaluate L1 did pedicle screws at L1 and L2 bilaterally with pilot holes cannulation probing tapping probing and then 6 5 x 45 screws inserted bilaterally. Fluoroscopy confirmed good position of all the implants I then aggressively decorticated after copious irrigation the TPs down to the lateral TPN mass at L3. Packed the local autograft mixed with BMP and the strips from L1-L3. I assembled a heads re-cut rods and contoured  and placed rods from L1-L5 reattaching all the knots in a cross-link at L2-3. Explored all the  foramina to confirm patency been after everything was anchored in place placed drain Spenco vancomycin powder closed the muscle and fascia with interrupted Vicryl injected Experell and then closed with interrupted Vicryl and running 4 subcuticular Dermabond benzo and Steri-Strips and a sterile dressing were applied patient recovered in stable condition. At the end of case all needle counts sponge counts were correct.

## 2015-09-12 NOTE — Progress Notes (Signed)
ANTIBIOTIC CONSULT NOTE - INITIAL  Pharmacy Consult for Vancomycin Indication: post-op prophylaxis  Allergies  Allergen Reactions  . Penicillins Rash    Has patient had a PCN reaction causing immediate rash, facial/tongue/throat swelling, SOB or lightheadedness with hypotension:  Has patient had a PCN reaction causing severe rash involving mucus membranes or skin necrosis:  Has patient had a PCN reaction that required hospitalization  Has patient had a PCN reaction occurring within the last 10 years:  If all of the above answers are "NO", then may proceed with Cephalosporin use.    Patient Measurements:   Adjusted Body Weight: n/a  Vital Signs: Temp: 97.9 F (36.6 C) (11/18 1611) Temp Source: Oral (11/18 0831) BP: 126/67 mmHg (11/18 1550) Pulse Rate: 91 (11/18 1611) Intake/Output from previous day:   Intake/Output from this shift: Total I/O In: 1550 [I.V.:1550] Out: 650 [Urine:325; Drains:75; Blood:250]  Labs: No results for input(s): WBC, HGB, PLT, LABCREA, CREATININE in the last 72 hours. CrCl cannot be calculated (Patient has no serum creatinine result on file.). No results for input(s): VANCOTROUGH, VANCOPEAK, VANCORANDOM, GENTTROUGH, GENTPEAK, GENTRANDOM, TOBRATROUGH, TOBRAPEAK, TOBRARND, AMIKACINPEAK, AMIKACINTROU, AMIKACIN in the last 72 hours.   Microbiology: Recent Results (from the past 720 hour(s))  Surgical pcr screen     Status: None   Collection Time: 09/04/15  2:29 PM  Result Value Ref Range Status   MRSA, PCR NEGATIVE NEGATIVE Final   Staphylococcus aureus NEGATIVE NEGATIVE Final    Comment:        The Xpert SA Assay (FDA approved for NASAL specimens in patients over 76 years of age), is one component of a comprehensive surveillance program.  Test performance has been validated by [facility] for patients greater than or equal to 35 year old. It is not intended to diagnose infection nor to guide or monitor treatment.     Medical  History: Past Medical History  Diagnosis Date  . Depression     attacks  . Arthritis   . Coronary artery disease   . Hypertension     takes Zestoretic daily  . Hyperlipidemia     takes Zocor daily  . Asthma 70's  . Pneumonia 70's  . Weakness     right leg  . Fibromyalgia   . Joint pain   . Joint swelling   . Chronic back pain     HNP  . Constipation     takes Sennokot nightly  . Nocturia   . History of kidney stones   . Diabetes mellitus without complication (HCC)     borderline   . Hypokalemia     takes Potassium daily  . Cataract     immature and to the left eye  . Anxiety     takes Xanax and Methadone daily  . Insomnia     takes trazodone nightly    Medications:  Scheduled:  . acetaminophen      . aspirin EC  325 mg Oral Daily  . bupivacaine liposome  20 mL Infiltration To NeurOR  . docusate sodium  100 mg Oral BID  . [START ON 09/13/2015] lisinopril  20 mg Oral Daily   And  . [START ON 09/13/2015] hydrochlorothiazide  25 mg Oral Daily  . HYDROmorphone      . HYDROmorphone      . HYDROmorphone      . HYDROmorphone      . meperidine      . methadone  5 mg Oral 3 times per day  .  multivitamin with minerals  1 tablet Oral Daily  . naproxen  500 mg Oral BID WC  . promethazine      . senna  2 tablet Oral QHS  . simvastatin  40 mg Oral QHS  . sodium chloride  3 mL Intravenous Q12H  . traZODone  250 mg Oral QHS  . vancomycin       Assessment: 69 yo male s/p decompressive lumbar laminectomy.  Pharmacy asked to start empiric vancomycin post op, hemovac drain present. No labs available.  Pt received 1g of vancomycin at 10 AM today.  Goal of Therapy:  Vancomycin trough level 10-15 mcg/ml  Plan:  1. Vancomycin 2g x 1 now, then 1g q 12 hrs. 2. Monitor Scr, clinical course, length of antibiotic rx.  Uvaldo Rising, BCPS  Clinical Pharmacist Pager [phone]  09/12/2015 5:39 PM

## 2015-09-13 MED ORDER — WHITE PETROLATUM GEL
Status: AC
  Filled 2015-09-13: qty 1

## 2015-09-13 NOTE — Progress Notes (Signed)
Subjective: Patient reports progressing, but sore  Objective: Vital signs in last 24 hours: Temp:  [97 F (36.1 C)-99.1 F (37.3 C)] 98.7 F (37.1 C) (11/19 0507) Pulse Rate:  [57-104] 68 (11/19 0507) Resp:  [10-28] 16 (11/19 0507) BP: (115-188)/(58-166) 120/67 mmHg (11/19 0507) SpO2:  [94 %-100 %] 96 % (11/19 0507) Weight:  [135.172 kg (298 lb)] 135.172 kg (298 lb) (11/18 1702)  Intake/Output from previous day: 11/18 0701 - 11/19 0700 In: 1550 [I.V.:1550] Out: 1351 [Urine:526; Drains:575; Blood:250] Intake/Output this shift:    Physical Exam: Strength full.  Dressing CDI  Lab Results: No results for input(s): WBC, HGB, HCT, PLT in the last 72 hours. BMET No results for input(s): NA, K, CL, CO2, GLUCOSE, BUN, CREATININE, CALCIUM in the last 72 hours.  Studies/Results: Dg Lumbar Spine 2-3 Views  09/12/2015  CLINICAL DATA:  L1-3 extension fusion EXAM: DG C-ARM 61-120 MIN; LUMBAR SPINE - 2-3 VIEW FLUOROSCOPY TIME:  34 seconds COMPARISON:  CT lumbar spine dated 08/28/2015 FINDINGS: Intraoperative fluoroscopic images during L1-3 posterior lumbar fusion. Status post L3-5 PLIF. IMPRESSION: Intraoperative fluoroscopic images during posterior lumbar fusion, as above. Electronically Signed   By: Julian Hy M.D.   On: 09/12/2015 13:44   Dg C-arm 1-60 Min  09/12/2015  CLINICAL DATA:  L1-3 extension fusion EXAM: DG C-ARM 61-120 MIN; LUMBAR SPINE - 2-3 VIEW FLUOROSCOPY TIME:  34 seconds COMPARISON:  CT lumbar spine dated 08/28/2015 FINDINGS: Intraoperative fluoroscopic images during L1-3 posterior lumbar fusion. Status post L3-5 PLIF. IMPRESSION: Intraoperative fluoroscopic images during posterior lumbar fusion, as above. Electronically Signed   By: Julian Hy M.D.   On: 09/12/2015 13:44    Assessment/Plan: Mobilize with PT today.    LOS: 1 day    Peggyann Shoals, MD 09/13/2015, 7:28 AM

## 2015-09-13 NOTE — Clinical Social Work Note (Signed)
CSW consult acknowledged:  Clinical Education officer, museum received a consult for SNF placement. PT currently recommending "no PT follow up".  Clinical Social Worker will sign off for now as social work intervention is no longer needed. Please consult [place] again if new need arises.  Glendon Axe, MSW, LCSWA [phone] 09/13/2015 9:44 AM

## 2015-09-13 NOTE — Care Management Note (Signed)
Case Management Note  Patient Details  Name: Breandan People MRN: [id number] Date of Birth: 12-20-1945  Subjective/Objective:                  69 yo male s/p decompressive lumbar laminectomy L1-L2 with foraminotomies of the L1 and L2 nerve roots, decompressive lumbar laminectomy L2-3, pedicle screw fixation L1-L5, and posterior lateral arthrodesis L1-L3  Action/Plan: PT is recommending intermittent supervision, no PT f/u or DME   Expected Discharge Date: 09/14/15                 Expected Discharge Plan:  Home/Self Care  In-House Referral:     Discharge planning Services  CM Consult  Post Acute Care Choice:    Choice offered to:     DME Arranged:    DME Agency:     HH Arranged:    [place]:     Status of Service:  Completed, signed off  Medicare Important Message Given:    Date Medicare IM Given:    Medicare IM give by:    Date Additional Medicare IM Given:    Additional Medicare Important Message give by:     If discussed at [place] of Stay Meetings, dates discussed:    Additional Comments: met with pt and wife to discuss D/C plan. Pt plans to return home with the support of his wife. He has a RW, RW with seat, 3-in-1 BSC, and a shower chair. No needs identified.  Norina Buzzard, RN 09/13/2015, 2:05 PM

## 2015-09-13 NOTE — Progress Notes (Signed)
Occupational Therapy Evaluation Patient Details Name: Michael Barr MRN: [id number] DOB: [birth year] Today's Date: 09/13/2015    History of Present Illness 70 y.o. male s/p decompressive lumbar laminectomy L1-L2 with foraminotomies of the L1 and L2 nerve roots, decompressive lumbar laminectomy L2-3, pedicle screw fixation L1-L5, and posterior lateral arthrodesis L1-L3. Hx of CAD, HTN, and multiple back surgeries.   Clinical Impression   Pt admitted with the above diagnoses and presents with below problem list. Pt will benefit from continued acute OT to address the below listed deficits and maximize independence with BADLs prior to d/c to venue below. PTA pt was mod I with ADLs. Pt is currently supervision for toilet transfers, min A for LB ADLs and posterior pericare. OT to continue to follow acutely.     Follow Up Recommendations  Supervision - Intermittent;No OT follow up    Equipment Recommendations  None recommended by OT    Recommendations for Other Services       Precautions / Restrictions Precautions Precautions: Back Precaution Comments: reviewed handout Required Braces or Orthoses: Spinal Brace Spinal Brace: Applied in sitting position;Lumbar corset Restrictions Weight Bearing Restrictions: No      Mobility Bed Mobility Overal bed mobility: Needs Assistance Bed Mobility: Sidelying to Sit;Sit to Sidelying Rolling: Supervision Sidelying to sit: Supervision     Sit to sidelying: Supervision General bed mobility comments: spervision for safety  Transfers Overall transfer level: Needs assistance Equipment used: Rolling walker (2 wheeled) Transfers: Sit to/from Stand Sit to Stand: Supervision         General transfer comment: cues for hand placement    Balance Overall balance assessment: Needs assistance;History of Falls (pt's spouse reports pt fell on the stairs about a year ago) Sitting-balance support: No upper extremity supported;Feet  supported Sitting balance-Leahy Scale: Good     Standing balance support: Bilateral upper extremity supported;During functional activity Standing balance-Leahy Scale: Fair Standing balance comment: stood to wash hands at sink                            ADL Overall ADL's : Needs assistance/impaired Eating/Feeding: Set up;Sitting   Grooming: Wash/dry hands;Min guard;Standing   Upper Body Bathing: Set up;Sitting   Lower Body Bathing: Minimal assistance;Sit to/from stand   Upper Body Dressing : Set up;Sitting   Lower Body Dressing: Minimal assistance;Sit to/from stand   Toilet Transfer: Supervision/safety;Ambulation;RW;Regular Toilet;Grab bars   Toileting- Clothing Manipulation and Hygiene: Minimal assistance;Sitting/lateral lean     Tub/Shower Transfer Details (indicate cue type and reason): tub transfer not attempted, pt reports he plans to sponge bath initillay at d/c  Functional mobility during ADLs: Supervision/safety;Rolling walker General ADL Comments: Reviewed ADL education including AE and strategies. Spouse present.      Vision     Perception     Praxis      Pertinent Vitals/Pain Pain Assessment: 0-10 Pain Score: 7  Pain Location: back Pain Descriptors / Indicators: Aching Pain Intervention(s): Limited activity within patient's tolerance;Monitored during session;Repositioned;Other (comment) (Pt just received oral pain med prior to OT.)     Hand Dominance Right   Extremity/Trunk Assessment Upper Extremity Assessment Upper Extremity Assessment: Overall WFL for tasks assessed   Lower Extremity Assessment Lower Extremity Assessment: Defer to PT evaluation       Communication Communication Communication: No difficulties   Cognition Arousal/Alertness: Awake/alert Behavior During Therapy: WFL for tasks assessed/performed Overall Cognitive Status: Within Functional Limits for tasks assessed  General Comments        Exercises       Shoulder Instructions      Home Living Family/patient expects to be discharged to:: Private residence Living Arrangements: Spouse/significant other Available Help at Discharge: Family;Available 24 hours/day Type of Home: House Home Access: Stairs to enter [employer] of Steps: 2   Home Layout: One level     Bathroom Shower/Tub: Tub/shower unit         Home Equipment: Environmental consultant - 4 wheels;Bedside commode          Prior Functioning/Environment Level of Independence: Independent with assistive device(s)        Comments: used rollator past 2 week leading up to surgery.    OT Diagnosis: Acute pain   OT Problem List: Impaired balance (sitting and/or standing);Decreased knowledge of use of DME or AE;Decreased knowledge of precautions;Pain   OT Treatment/Interventions: Self-care/ADL training;DME and/or AE instruction;Therapeutic activities;Patient/family education;Balance training    OT Goals(Current goals can be found in the care plan section) Acute Rehab OT Goals Patient Stated Goal: No more surgery OT Goal Formulation: With patient/family Time For Goal Achievement: 09/20/15 Potential to Achieve Goals: Good ADL Goals Pt Will Perform Grooming: with modified independence;standing;sitting Pt Will Perform Lower Body Bathing: with modified independence;sit to/from stand;with adaptive equipment Pt Will Perform Lower Body Dressing: with modified independence;with adaptive equipment;sit to/from stand Pt Will Transfer to Toilet: with modified independence;ambulating (3n1 over toilet) Pt Will Perform Toileting - Clothing Manipulation and hygiene: with modified independence;sitting/lateral leans;sit to/from stand;with adaptive equipment Pt Will Perform Tub/Shower Transfer: Tub transfer;with min assist;ambulating;3 in 1;rolling walker  OT Frequency: Min 2X/week   Barriers to D/C:            Co-evaluation              End of Session Equipment  Utilized During Treatment: Gait belt;Rolling walker;Back brace  Activity Tolerance: Patient tolerated treatment well Patient left: in bed;with call bell/phone within reach;with family/visitor present;with SCD's reapplied   Time: [id number] OT Time Calculation (min): 25 min Charges:  OT General Charges $OT Visit: 1 Procedure OT Evaluation $Initial OT Evaluation Tier I: 1 Procedure OT Treatments $Self Care/Home Management : 8-22 mins G-Codes:    Hortencia Pilar September 29, 2015, 2:43 PM

## 2015-09-13 NOTE — Progress Notes (Signed)
Physical Therapy Treatment Patient Details Name: Michael Barr MRN: [id number] DOB: Jul 24, 1946 Today's Date: 09/13/2015    History of Present Illness 69 y.o. male s/p decompressive lumbar laminectomy L1-L2 with foraminotomies of the L1 and L2 nerve roots, decompressive lumbar laminectomy L2-3, pedicle screw fixation L1-L5, and posterior lateral arthrodesis L1-L3. Hx of CAD, HTN, and multiple back surgeries.    PT Comments    Pt progressing with mobility.  Currently supervision for bed mobility, transfers, & gait.  Pt c/o increased back pain towards end of gait, RN notified & administered IV pain medication.  Pt needs to practice steps before d/c home.      Follow Up Recommendations  No PT follow up;Supervision - Intermittent     Equipment Recommendations  None recommended by PT    Recommendations for Other Services OT consult     Precautions / Restrictions Precautions Precautions: Back Precaution Comments: reviewed handout Required Braces or Orthoses: Spinal Brace Spinal Brace: Applied in sitting position;Lumbar corset Restrictions Weight Bearing Restrictions: No    Mobility  Bed Mobility Overal bed mobility: Needs Assistance Bed Mobility: Sidelying to Sit;Sit to Sidelying Rolling: Supervision Sidelying to sit: Supervision     Sit to sidelying: Supervision General bed mobility comments: supervision to ensure adherence to back precautions.  used rail to transition side>sitting but without rail for sitting>side  Transfers Overall transfer level: Needs assistance Equipment used: Rolling walker (2 wheeled) Transfers: Sit to/from Stand Sit to Stand: Supervision         General transfer comment: cues for hand placement  Ambulation/Gait Ambulation/Gait assistance: Supervision Ambulation Distance (Feet): 140 Feet Assistive device: Rolling walker (2 wheeled) Gait Pattern/deviations: Step-through pattern;Decreased stride length Gait velocity: decreased    General Gait Details: slow & guarded gait but steady.  encouragement to relax UE's.     Stairs            Wheelchair Mobility    Modified Rankin (Stroke Patients Only)       Balance                                    Cognition Arousal/Alertness: Awake/alert Behavior During Therapy: WFL for tasks assessed/performed Overall Cognitive Status: Within Functional Limits for tasks assessed                      Exercises      General Comments        Pertinent Vitals/Pain Pain Assessment: 0-10 Pain Score: 6  Pain Location: back Pain Descriptors / Indicators: Aching Pain Intervention(s): Monitored during session;Repositioned;RN gave pain meds during session;Patient requesting pain meds-RN notified    Home Living                      Prior Function            PT Goals (current goals can now be found in the care plan section) Acute Rehab PT Goals Patient Stated Goal: No more surgery PT Goal Formulation: With patient Time For Goal Achievement: 09/26/15 Potential to Achieve Goals: Good Progress towards PT goals: Progressing toward goals    Frequency  Min 5X/week    PT Plan Current plan remains appropriate    Co-evaluation             End of Session Equipment Utilized During Treatment: Back brace Activity Tolerance: Patient tolerated treatment well Patient left: in bed;with call bell/phone within reach;with  nursing/sitter in room     Time: [id number] PT Time Calculation (min) (ACUTE ONLY): 23 min  Charges:  $Gait Training: 8-22 mins $Therapeutic Activity: 8-22 mins                    G Codes:      Sena Hitch 09/13/2015, 12:19 PM   Sarajane Marek, PTA [phone] 09/13/2015

## 2015-09-13 NOTE — Care Management Important Message (Signed)
Important Message  Patient Details  Name: Michael Barr MRN: [id number] Date of Birth: October 22, 1946   Medicare Important Message Given:  Yes    Norina Buzzard, RN 09/13/2015, 2:10 PM

## 2015-09-14 LAB — CBC WITH DIFFERENTIAL/PLATELET
BASOS ABS: 0 10*3/uL (ref 0.0–0.1)
Basophils Relative: 0 %
Eosinophils Absolute: 0.1 10*3/uL (ref 0.0–0.7)
Eosinophils Relative: 2 %
HEMATOCRIT: 31.8 % — AB (ref 39.0–52.0)
Hemoglobin: 10.3 g/dL — ABNORMAL LOW (ref 13.0–17.0)
LYMPHS ABS: 1.7 10*3/uL (ref 0.7–4.0)
LYMPHS PCT: 20 %
MCH: 29.3 pg (ref 26.0–34.0)
MCHC: 32.4 g/dL (ref 30.0–36.0)
MCV: 90.3 fL (ref 78.0–100.0)
MONO ABS: 1 10*3/uL (ref 0.1–1.0)
MONOS PCT: 11 %
NEUTROS ABS: 5.8 10*3/uL (ref 1.7–7.7)
Neutrophils Relative %: 67 %
Platelets: 192 10*3/uL (ref 150–400)
RBC: 3.52 MIL/uL — ABNORMAL LOW (ref 4.22–5.81)
RDW: 14.4 % (ref 11.5–15.5)
WBC: 8.5 10*3/uL (ref 4.0–10.5)

## 2015-09-14 NOTE — Progress Notes (Signed)
Physical Therapy Treatment Patient Details Name: Michael Barr MRN: [id number] DOB: 1946-08-02 Today's Date: 09/14/2015    History of Present Illness 69 y.o. male s/p decompressive lumbar laminectomy L1-L2 with foraminotomies of the L1 and L2 nerve roots, decompressive lumbar laminectomy L2-3, pedicle screw fixation L1-L5, and posterior lateral arthrodesis L1-L3. Hx of CAD, HTN, and multiple back surgeries.    PT Comments    Pt progressing towards physical therapy goals. Was able to improve gait distance, however tolerance for functional activity remains low and stair training had to be deferred due to fatigue. Pt will need to practice the stairs prior to d/c - likely tomorrow. Will continue to follow.   Follow Up Recommendations  Outpatient PT (When appropriate per post-op protocol)     Equipment Recommendations  None recommended by PT    Recommendations for Other Services       Precautions / Restrictions Precautions Precautions: Back Precaution Booklet Issued: Yes (comment) Precaution Comments: reviewed precautions with pt and wife Required Braces or Orthoses: Spinal Brace Spinal Brace: Applied in sitting position;Lumbar corset Restrictions Weight Bearing Restrictions: No    Mobility  Bed Mobility Overal bed mobility: Needs Assistance Bed Mobility: Rolling;Sidelying to Sit;Sit to Sidelying Rolling: Supervision Sidelying to sit: Supervision     Sit to sidelying: Supervision General bed mobility comments: Supervision for safety. VC's for maintenance of back precautions with log roll.  Transfers Overall transfer level: Needs assistance Equipment used: Rolling walker (2 wheeled) Transfers: Sit to/from Stand Sit to Stand: Supervision         General transfer comment: VC's for hand placement on seated surface for safety. Pt requires cues to maintain good posture and back precautions with transitions.  Ambulation/Gait Ambulation/Gait assistance:  Supervision Ambulation Distance (Feet): 200 Feet Assistive device: Rolling walker (2 wheeled) Gait Pattern/deviations: Step-through pattern;Decreased stride length Gait velocity: decreased Gait velocity interpretation: Below normal speed for age/gender General Gait Details: VC's for improved posture. Generally slow but steady gait. Pt fatigues quickly.    Stairs Stairs:  (Pt too fatigued to attempt stair training at this time. )          Wheelchair Mobility    Modified Rankin (Stroke Patients Only)       Balance Overall balance assessment: Needs assistance;History of Falls Sitting-balance support: No upper extremity supported;Feet supported Sitting balance-Leahy Scale: Good     Standing balance support: No upper extremity supported;During functional activity Standing balance-Leahy Scale: Fair Standing balance comment: Stood without UE support to prepare for stand>sit onto commode seat.                     Cognition Arousal/Alertness: Awake/alert Behavior During Therapy: WFL for tasks assessed/performed Overall Cognitive Status: Within Functional Limits for tasks assessed                      Exercises      General Comments        Pertinent Vitals/Pain Pain Assessment: 0-10 Pain Score: 6  Pain Location: Back Pain Descriptors / Indicators: Operative site guarding;Grimacing Pain Intervention(s): Limited activity within patient's tolerance;Monitored during session;Repositioned    Home Living Family/patient expects to be discharged to:: Private residence                    Prior Function            PT Goals (current goals can now be found in the care plan section) Acute Rehab PT Goals Patient Stated Goal: No  more surgery PT Goal Formulation: With patient/family Time For Goal Achievement: 09/26/15 Potential to Achieve Goals: Good Progress towards PT goals: Progressing toward goals    Frequency  Min 5X/week    PT Plan Current  plan remains appropriate    Co-evaluation             End of Session Equipment Utilized During Treatment: Back brace Activity Tolerance: Patient tolerated treatment well Patient left: in bed;with call bell/phone within reach;with nursing/sitter in room     Time: 0749-0809 PT Time Calculation (min) (ACUTE ONLY): 20 min  Charges:  $Gait Training: 8-22 mins                    G Codes:      Rolinda Roan 09-24-2015, 8:17 AM   Rolinda Roan, PT, DPT Acute Rehabilitation Services Pager: [phone]

## 2015-09-14 NOTE — Progress Notes (Signed)
Patient ID: Michael Barr, male   DOB: May 14, 1946, 69 y.o.   MRN: [id number] Pain under control, no weakness. hemovac still draining. No dizziness. To get a cbc

## 2015-09-15 LAB — BASIC METABOLIC PANEL
Anion gap: 7 (ref 5–15)
BUN: 18 mg/dL (ref 6–20)
CALCIUM: 8.6 mg/dL — AB (ref 8.9–10.3)
CHLORIDE: 101 mmol/L (ref 101–111)
CO2: 28 mmol/L (ref 22–32)
CREATININE: 1.12 mg/dL (ref 0.61–1.24)
GFR calc non Af Amer: >60 mL/min (ref >60)
Glucose, Bld: 110 mg/dL — ABNORMAL HIGH (ref 65–99)
Potassium: 3.9 mmol/L (ref 3.5–5.1)
SODIUM: 136 mmol/L (ref 135–145)

## 2015-09-15 MED ORDER — OXYCODONE HCL 10 MG PO TABS: 10.0000 mg | ORAL_TABLET | Freq: Three times a day (TID) | ORAL | Status: DC | PRN

## 2015-09-15 MED ORDER — POLYETHYLENE GLYCOL 3350 17 G PO PACK
17.0000 g | PACK | Freq: Every day | ORAL | Status: DC
  Administered 2015-09-15: 17 g via ORAL
  Filled 2015-09-15: qty 1

## 2015-09-15 MED FILL — Sodium Chloride IV Soln 0.9%: INTRAVENOUS | Qty: 1000 | Status: AC

## 2015-09-15 MED FILL — Heparin Sodium (Porcine) Inj 1000 Unit/ML: INTRAMUSCULAR | Qty: 30 | Status: AC

## 2015-09-15 NOTE — Progress Notes (Signed)
Pt discharging home with wife taking all personal belongings. Surgical dressing site changed, clean, dry and intact. Hemovac removed per Md order 8ml emptied dry dressing applied. IV discontinued, dry dressing applied. Discharge instructions provided with prescription with verbal understanding. Pt has a follow up appt scheduled for 09/23/15 at 10 am per wife. No noted distress.

## 2015-09-15 NOTE — Discharge Instructions (Signed)
No lifting no bending no twisting no driving a riding a car unless he is calm back and forth to see me. Keep the incision clean dry and intact. °

## 2015-09-15 NOTE — Progress Notes (Signed)
Physical Therapy Treatment Patient Details Name: Michael Barr MRN: [id number] DOB: 1945-11-10 Today's Date: 09/15/2015    History of Present Illness 69 y.o. male s/p decompressive lumbar laminectomy L1-L2 with foraminotomies of the L1 and L2 nerve roots, decompressive lumbar laminectomy L2-3, pedicle screw fixation L1-L5, and posterior lateral arthrodesis L1-L3. Hx of CAD, HTN, and multiple back surgeries.    PT Comments    Pt progressing towards physical therapy goals. Was able to negotiate 4 stairs this session, and demonstrated safe technique without UE support to simulate home environment. Pt and wife were educated on proper DME for home, and recommending use of RW instead of 4WW. Pt anticipates d/c home this afternoon. Will continue to follow.   Follow Up Recommendations  Outpatient PT (When appropriate per post-op protocol)     Equipment Recommendations  None recommended by PT    Recommendations for Other Services OT consult     Precautions / Restrictions Precautions Precautions: Back;Fall Precaution Booklet Issued: Yes (comment) Precaution Comments: reviewed precautions with pt and wife Required Braces or Orthoses: Spinal Brace Spinal Brace: Applied in sitting position;Lumbar corset Restrictions Weight Bearing Restrictions: No    Mobility  Bed Mobility Overal bed mobility: Needs Assistance Bed Mobility: Rolling;Sidelying to Sit Rolling: Modified independent (Device/Increase time) Sidelying to sit: Modified independent (Device/Increase time)       General bed mobility comments: VC's for maintenance of back precautions with transition to full sitting.  Transfers Overall transfer level: Needs assistance Equipment used: Rolling walker (2 wheeled) Transfers: Sit to/from Stand Sit to Stand: Modified independent (Device/Increase time)         General transfer comment: VC's for hand placement on seated surface for safety.   Ambulation/Gait Ambulation/Gait  assistance: Supervision Ambulation Distance (Feet): 600 Feet Assistive device: Rolling walker (2 wheeled) Gait Pattern/deviations: Step-through pattern;Decreased stride length Gait velocity: decreased Gait velocity interpretation: Below normal speed for age/gender General Gait Details: VC's for improved posture. Generally slow but steady gait.    Stairs Stairs: Yes Stairs assistance: Min guard Stair Management: Two rails;No rails;Forwards Number of Stairs: 4 (2 steps x2 attempts) General stair comments: Pt initially using bilateral railings, and then was able to ascend 2 steps without UE support to simulate home environment. Pt turned 45 to step down at an angle for comfort.   Wheelchair Mobility    Modified Rankin (Stroke Patients Only)       Balance Overall balance assessment: Needs assistance Sitting-balance support: No upper extremity supported;Feet supported Sitting balance-Leahy Scale: Good     Standing balance support: No upper extremity supported;During functional activity Standing balance-Leahy Scale: Fair                      Cognition Arousal/Alertness: Awake/alert Behavior During Therapy: WFL for tasks assessed/performed Overall Cognitive Status: Within Functional Limits for tasks assessed                      Exercises      General Comments        Pertinent Vitals/Pain Pain Assessment: Faces Faces Pain Scale: Hurts even more Pain Location: back Pain Descriptors / Indicators: Operative site guarding;Discomfort;Grimacing Pain Intervention(s): Limited activity within patient's tolerance;Monitored during session;Repositioned    Home Living                      Prior Function            PT Goals (current goals can now be found in the  care plan section) Acute Rehab PT Goals Patient Stated Goal: No more surgery PT Goal Formulation: With patient/family Time For Goal Achievement: 09/26/15 Potential to Achieve Goals:  Good Progress towards PT goals: Progressing toward goals    Frequency  Min 5X/week    PT Plan Current plan remains appropriate    Co-evaluation             End of Session Equipment Utilized During Treatment: Back brace Activity Tolerance: Patient tolerated treatment well Patient left: in bed;with call bell/phone within reach;with nursing/sitter in room     Time: [id number] PT Time Calculation (min) (ACUTE ONLY): 34 min  Charges:  $Gait Training: 23-37 mins                    G Codes:      Rolinda Roan Sep 22, 2015, 2:07 PM   Rolinda Roan, PT, DPT Acute Rehabilitation Services Pager: [phone]

## 2015-09-15 NOTE — Progress Notes (Signed)
Subjective: Patient reports Doing well no leg pain just back pain and manageable.  Objective: Vital signs in last 24 hours: Temp:  [98.3 F (36.8 C)-99 F (37.2 C)] 98.5 F (36.9 C) (11/21 0938) Pulse Rate:  [69-81] 72 (11/21 0938) Resp:  [17-18] 18 (11/21 0606) BP: (95-122)/(39-66) 122/66 mmHg (11/21 0938) SpO2:  [94 %-98 %] 97 % (11/21 0938)  Intake/Output from previous day: 11/20 0701 - 11/21 0700 In: 3 [I.V.:3] Out: 985 [Urine:800; Drains:185] Intake/Output this shift: Total I/O In: 240 [P.O.:240] Out: -   strength out of 5  Lab Results:  Recent Labs  09/14/15 1130  WBC 8.5  HGB 10.3*  HCT 31.8*  PLT 192   BMET  Recent Labs  09/15/15 0245  NA 136  K 3.9  CL 101  CO2 28  GLUCOSE 110*  BUN 18  CREATININE 1.12  CALCIUM 8.6*    Studies/Results: No results found.  Assessment/Plan: Discharge home  LOS: 3 days     Tandy Grawe P 09/15/2015, 1:27 PM

## 2015-09-15 NOTE — Progress Notes (Signed)
OT Cancellation Note  Patient Details Name: Zed Kanaley MRN: [id number] DOB: 1946-03-24   Cancelled Treatment:    Reason Eval/Treat Not Completed: Other (comment).  attempted skilled OT with pt. And spouse present.  Both decline need for demo/reivew of any aspects of toileting.  Spouse states she will be assisting with LB dressing.  Declined need for tub transfer at initial eval secondary to sponge bathing and plans to convert b.room with tub with one with shower stall per wife.  State they have been through at least 4 previous sx. And "have it all pretty much down pat now".  Reviewed all goals again and offered any review.  They state they are fine and just need to do stairs with PT.  Informed them OT will sign off and they agreed.  No further needs at this time per pt. And spouse.    Janice Coffin, COTA/L 09/15/2015, 9:00 AM

## 2015-09-15 NOTE — Care Management Note (Signed)
Case Management Note  Patient Details  Name: Michael Barr MRN: [id number] Date of Birth: 05/22/1946  Subjective/Objective:                    Action/Plan: Patient being discharged home today with self care. MD ordered rolling walker with a seat but patient already owns walker with a seat. PT recommending a regular rolling walker and patient requesting to have this instead. Jermaine with [employer] [facility] notified and is going to deliver the walker to the room.  Expected Discharge Date:                  Expected Discharge Plan:  Home/Self Care  In-House Referral:     Discharge planning Services  CM Consult  Post Acute Care Choice:    Choice offered to:     DME Arranged:    DME Agency:     HH Arranged:    [place] Agency:     Status of Service:  Completed, signed off  Medicare Important Message Given:  Yes Date Medicare IM Given:    Medicare IM give by:    Date Additional Medicare IM Given:    Additional Medicare Important Message give by:     If discussed at [place] of Stay Meetings, dates discussed:    Additional Comments:  Pollie Friar, RN 09/15/2015, 1:46 PM

## 2015-09-15 NOTE — Discharge Summary (Signed)
Physician Discharge Summary  Patient ID: Michael Barr MRN: [id number] DOB/AGE: August 01, 1946 69 y.o.  Admit date: 09/12/2015 Discharge date: 09/15/2015  Admission Diagnoses: Lumbar spondylosis stenosis and herniated mucous pulposis L2-3 and L1-L2  Discharge Diagnoses: Same Active Problems:   Spinal stenosis of lumbar region   Discharged Condition: good  Hospital Course: Patient is been the hospital underwent decompression stable position procedure at L1-L2 and L2-3 postoperative patient did very well recovered in the floor on the floor he was ambulating and voiding spontaneously tolerating regular diet was stable for discharge home. Patient will be discharged scheduled follow-up in one to 2 weeks.  Consults: Significant Diagnostic Studies: Treatments: L1-L2 decompression stabilization procedure L2-3 interbody fusion. Discharge Exam: Blood pressure 122/66, pulse 72, temperature 98.5 F (36.9 C), temperature source Oral, resp. rate 18, height 5\' 8"  (1.727 m), weight 135.172 kg (298 lb), SpO2 97 %. Strength 5 out of 5  Disposition: Home  Discharge Instructions    Care order/instruction    Complete by:  As directed   D/C hemov     For home use only DME 4 wheeled rolling walker with seat    Complete by:  As directed             Medication List    TAKE these medications        acetaminophen 500 MG tablet  Commonly known as:  TYLENOL  Take 500 mg by mouth daily as needed for mild pain.     albuterol 108 (90 BASE) MCG/ACT inhaler  Commonly known as:  PROVENTIL HFA;VENTOLIN HFA  Inhale 1 puff into the lungs every 6 (six) hours as needed for wheezing or shortness of breath.     ALPRAZolam 0.5 MG tablet  Commonly known as:  XANAX  Take 0.5 mg by mouth 3 (three) times daily as needed for anxiety.     aspirin EC 325 MG tablet  Take 325 mg by mouth daily.     cyclobenzaprine 10 MG tablet  Commonly known as:  FLEXERIL  Take 1 tablet (10 mg total) by mouth 3 (three)  times daily as needed for muscle spasms.     lisinopril-hydrochlorothiazide 20-25 MG tablet  Commonly known as:  PRINZIDE,ZESTORETIC  Take 1 tablet by mouth every morning.     methadone 5 MG tablet  Commonly known as:  DOLOPHINE  Take 5 mg by mouth every 8 (eight) hours.     methadone 10 MG tablet  Commonly known as:  DOLOPHINE  Take 10 mg by mouth every 8 (eight) hours as needed (for anxiety and pain).     morphine 30 MG tablet  Commonly known as:  MSIR  Take 1 tablet (30 mg total) by mouth every 3 (three) hours as needed for moderate pain or severe pain.     multivitamin with minerals tablet  Take 1 tablet by mouth daily.     naproxen 500 MG tablet  Commonly known as:  NAPROSYN  Take 500 mg by mouth 2 (two) times daily with a meal.     OVER THE COUNTER MEDICATION  Take 100 mg by mouth every morning. Take 200 mg in the morning     Oxycodone HCl 10 MG Tabs  Take 1 tablet (10 mg total) by mouth 3 (three) times daily as needed (severe pain).     Oxycodone HCl 10 MG Tabs  Take 1 tablet (10 mg total) by mouth 3 (three) times daily as needed (severe pain).     Oxycodone HCl 10 MG Tabs  Take 10 mg by mouth 3 (three) times daily as needed (severe pain).     potassium gluconate 595 MG Tabs tablet  Take 595 mg by mouth daily.     senna 8.6 MG Tabs tablet  Commonly known as:  SENOKOT  Take 2 tablets by mouth at bedtime.     simvastatin 40 MG tablet  Commonly known as:  ZOCOR  Take 40 mg by mouth at bedtime.     traZODone 100 MG tablet  Commonly known as:  DESYREL  Take 250 mg by mouth at bedtime.           Follow-up Information    Follow up with [facility], MD.   Specialty:  Neurosurgery   Contact information:   [address] [place] [address] [phone]       Signed: Elaina Hoops 09/15/2015, 1:30 PM

## 2015-11-27 DIAGNOSIS — M5137 Other intervertebral disc degeneration, lumbosacral region: Secondary | ICD-10-CM | POA: Diagnosis not present

## 2015-12-23 DIAGNOSIS — J029 Acute pharyngitis, unspecified: Secondary | ICD-10-CM | POA: Diagnosis not present

## 2015-12-23 DIAGNOSIS — R05 Cough: Secondary | ICD-10-CM | POA: Diagnosis not present

## 2015-12-23 DIAGNOSIS — J209 Acute bronchitis, unspecified: Secondary | ICD-10-CM | POA: Diagnosis not present

## 2015-12-29 DIAGNOSIS — M5137 Other intervertebral disc degeneration, lumbosacral region: Secondary | ICD-10-CM | POA: Diagnosis not present

## 2015-12-29 DIAGNOSIS — G894 Chronic pain syndrome: Secondary | ICD-10-CM | POA: Diagnosis not present

## 2016-01-08 DIAGNOSIS — I2583 Coronary atherosclerosis due to lipid rich plaque: Secondary | ICD-10-CM | POA: Diagnosis not present

## 2016-01-08 DIAGNOSIS — E785 Hyperlipidemia, unspecified: Secondary | ICD-10-CM | POA: Diagnosis not present

## 2016-01-08 DIAGNOSIS — D649 Anemia, unspecified: Secondary | ICD-10-CM | POA: Diagnosis not present

## 2016-01-08 DIAGNOSIS — E7800 Pure hypercholesterolemia, unspecified: Secondary | ICD-10-CM | POA: Diagnosis not present

## 2016-01-08 DIAGNOSIS — E119 Type 2 diabetes mellitus without complications: Secondary | ICD-10-CM | POA: Diagnosis not present

## 2016-01-08 DIAGNOSIS — Z86010 Personal history of colonic polyps: Secondary | ICD-10-CM | POA: Diagnosis not present

## 2016-01-08 DIAGNOSIS — I10 Essential (primary) hypertension: Secondary | ICD-10-CM | POA: Diagnosis not present

## 2016-02-24 ENCOUNTER — Other Ambulatory Visit: Payer: Self-pay | Admitting: Neurosurgery

## 2016-02-24 DIAGNOSIS — M5137 Other intervertebral disc degeneration, lumbosacral region: Secondary | ICD-10-CM

## 2016-02-25 ENCOUNTER — Ambulatory Visit
Admission: RE | Admit: 2016-02-25 | Discharge: 2016-02-25 | Disposition: A | Discharge status: Home / Self Care | Payer: Medicare Other | Source: Ambulatory Visit | Attending: Neurosurgery | Admitting: Neurosurgery

## 2016-02-25 DIAGNOSIS — M5137 Other intervertebral disc degeneration, lumbosacral region: Secondary | ICD-10-CM

## 2016-02-25 DIAGNOSIS — M5126 Other intervertebral disc displacement, lumbar region: Secondary | ICD-10-CM | POA: Diagnosis not present

## 2016-03-02 ENCOUNTER — Other Ambulatory Visit: Payer: Self-pay | Admitting: Neurosurgery

## 2016-03-02 DIAGNOSIS — S32009K Unspecified fracture of unspecified lumbar vertebra, subsequent encounter for fracture with nonunion: Secondary | ICD-10-CM | POA: Diagnosis not present

## 2016-03-23 ENCOUNTER — Encounter (HOSPITAL_COMMUNITY): Payer: Self-pay

## 2016-03-23 ENCOUNTER — Other Ambulatory Visit (HOSPITAL_COMMUNITY): Payer: Self-pay | Admitting: *Deleted

## 2016-03-23 ENCOUNTER — Encounter (HOSPITAL_COMMUNITY)
Admission: RE | Admit: 2016-03-23 | Discharge: 2016-03-23 | Disposition: A | Discharge status: Home / Self Care | Payer: Medicare Other | Source: Ambulatory Visit | Attending: Neurosurgery | Admitting: Neurosurgery

## 2016-03-23 LAB — BASIC METABOLIC PANEL
ANION GAP: 9 (ref 5–15)
BUN: 25 mg/dL — ABNORMAL HIGH (ref 6–20)
CO2: 25 mmol/L (ref 22–32)
Calcium: 9.7 mg/dL (ref 8.9–10.3)
Chloride: 106 mmol/L (ref 101–111)
Creatinine, Ser: 1.13 mg/dL (ref 0.61–1.24)
GFR calc Af Amer: >60 mL/min (ref >60)
GLUCOSE: 120 mg/dL — AB (ref 65–99)
POTASSIUM: 4.2 mmol/L (ref 3.5–5.1)
Sodium: 140 mmol/L (ref 135–145)

## 2016-03-23 LAB — CBC
HEMATOCRIT: 40.1 % (ref 39.0–52.0)
HEMOGLOBIN: 12.7 g/dL — AB (ref 13.0–17.0)
MCH: 27.6 pg (ref 26.0–34.0)
MCHC: 31.7 g/dL (ref 30.0–36.0)
MCV: 87.2 fL (ref 78.0–100.0)
Platelets: 204 10*3/uL (ref 150–400)
RBC: 4.6 MIL/uL (ref 4.22–5.81)
RDW: 16.2 % — ABNORMAL HIGH (ref 11.5–15.5)
WBC: 9.1 10*3/uL (ref 4.0–10.5)

## 2016-03-23 LAB — SURGICAL PCR SCREEN
MRSA, PCR: NEGATIVE
STAPHYLOCOCCUS AUREUS: NEGATIVE

## 2016-03-23 LAB — GLUCOSE, CAPILLARY: GLUCOSE-CAPILLARY: 171 mg/dL — AB (ref 65–99)

## 2016-03-23 NOTE — Pre-Procedure Instructions (Signed)
Michael Barr  03/23/2016      [id number] - [place] [address] Phone: [phone] Fax: [phone]    Your procedure is scheduled on June 6th, 2017.  Report to [facility] Admitting at 0900 A.M.  Call this number if you have problems the morning of surgery:  [phone]   Remember:  Do not eat food or drink liquids after midnight.   Take these medicines the morning of surgery with A SIP OF WATER: Tylenol if needed, Alprazolam(xanax), Nitro if needed, Oxycodone if needed, methadone if needed,   7 days prior to surgery STOP taking any Aspirin, Aleve, Naproxen, Ibuprofen, Motrin, Advil, Goody's, BC's, all herbal medications, fish oil, and all vitamins  WHAT DO I DO ABOUT MY DIABETES MEDICATION?   Marland Kitchen Do not take oral diabetes medicines (pills) the morning of surgery.   How to Manage Your Diabetes Before and After Surgery  Why is it important to control my blood sugar before and after surgery? . Improving blood sugar levels before and after surgery helps healing and can limit problems. . A way of improving blood sugar control is eating a healthy diet by: o  Eating less sugar and carbohydrates o  Increasing activity/exercise o  Talking with your doctor about reaching your blood sugar goals . High blood sugars (greater than 180 mg/dL) can raise your risk of infections and slow your recovery, so you will need to focus on controlling your diabetes during the weeks before surgery. . Make sure that the doctor who takes care of your diabetes knows about your planned surgery including the date and location.  How do I manage my blood sugar before surgery? . Check your blood sugar at least 4 times a day, starting 2 days before surgery, to make sure that the level is not too high or low. o Check your blood sugar the morning of your surgery when you wake up and every 2 hours until you get to the Short Stay unit. . If  your blood sugar is less than 70 mg/dL, you will need to treat for low blood sugar: o Do not take insulin. o Treat a low blood sugar (less than 70 mg/dL) with  cup of clear juice (cranberry or apple), 4 glucose tablets, OR glucose gel. o Recheck blood sugar in 15 minutes after treatment (to make sure it is greater than 70 mg/dL). If your blood sugar is not greater than 70 mg/dL on recheck, call [phone] for further instructions. . Report your blood sugar to the short stay nurse when you get to Short Stay.  . If you are admitted to the hospital after surgery: o Your blood sugar will be checked by the staff and you will probably be given insulin after surgery (instead of oral diabetes medicines) to make sure you have good blood sugar levels. o The goal for blood sugar control after surgery is 80-180 mg/dL.    Do not wear lotions, powders, or Cologne.  You may NOT wear deodorant.  Men may shave face and neck.  Do not bring valuables to the hospital.  [facility] is not responsible for any belongings or valuables.  Contacts, dentures or bridgework may not be worn into surgery.  Leave your suitcase in the car.  After surgery it may be brought to your room.  For patients admitted to the hospital, discharge time will be determined by your treatment team.  Patients discharged the day of  surgery will not be allowed to drive home.    Special instructions:   [place]- Preparing For Surgery  Before surgery, you can play an important role. Because skin is not sterile, your skin needs to be as free of germs as possible. You can reduce the number of germs on your skin by washing with CHG (chlorahexidine gluconate) Soap before surgery.  CHG is an antiseptic cleaner which kills germs and bonds with the skin to continue killing germs even after washing.  Please do not use if you have an allergy to CHG or antibacterial soaps. If your skin becomes reddened/irritated stop using the CHG.  Do not shave  (including legs and underarms) for at least 48 hours prior to first CHG shower. It is OK to shave your face.  Please follow these instructions carefully.   1. Shower the NIGHT BEFORE SURGERY and the MORNING OF SURGERY with CHG.   2. If you chose to wash your hair, wash your hair first as usual with your normal shampoo.  3. After you shampoo, rinse your hair and body thoroughly to remove the shampoo.  4. Use CHG as you would any other liquid soap. You can apply CHG directly to the skin and wash gently with a scrungie or a clean washcloth.   5. Apply the CHG Soap to your body ONLY FROM THE NECK DOWN.  Do not use on open wounds or open sores. Avoid contact with your eyes, ears, mouth and genitals (private parts). Wash genitals (private parts) with your normal soap.  6. Wash thoroughly, paying special attention to the area where your surgery will be performed.  7. Thoroughly rinse your body with warm water from the neck down.  8. DO NOT shower/wash with your normal soap after using and rinsing off the CHG Soap.  9. Pat yourself dry with a CLEAN TOWEL.   10. Wear CLEAN PAJAMAS   11. Place CLEAN SHEETS on your bed the night of your first shower and DO NOT SLEEP WITH PETS.    Day of Surgery: Do not apply any deodorants/lotions. Please wear clean clothes to the hospital/surgery center.      Please read over the following fact sheets that you were given. Pain Booklet, Coughing and Deep Breathing, MRSA Information and Surgical Site Infection Prevention

## 2016-03-23 NOTE — Progress Notes (Signed)
PCP - Quincy Simmonds, [place], [place] Cardiologist - Dr. Rosalita Chessman - cardiac workup back in November surgery 2016  Chest x-ray -  EKG - 09-09-16 Stress Test - 09-09-15 ECHO - requested Cardiac Cath - > 10 years ago  Requested information from Dr. Quincy Simmonds.  Patient also scored a 5 on the sleep apnea tool and the STOP Bang tool was also sent to Dr. Marcene Brawn reviewed chart back in November 2016.  Nothing has changed with patient since that review  Patient is not diabetic or taking anything for diabetes at this time it is controlled by his diet Fasting Glucose _90s to 100s  Patient denies shortness of breath and chest pain at PAT appointment

## 2016-03-25 MED ORDER — VANCOMYCIN HCL IN DEXTROSE 1-5 GM/200ML-% IV SOLN
1000.0000 mg | INTRAVENOUS | Status: AC
  Administered 2016-03-26: 1000 mg via INTRAVENOUS
  Filled 2016-03-25: qty 200

## 2016-03-25 MED ORDER — DEXAMETHASONE SODIUM PHOSPHATE 10 MG/ML IJ SOLN
10.0000 mg | INTRAMUSCULAR | Status: AC
  Administered 2016-03-26: 10 mg via INTRAVENOUS
  Filled 2016-03-25: qty 1

## 2016-03-26 ENCOUNTER — Encounter (HOSPITAL_COMMUNITY): Payer: Self-pay | Admitting: Anesthesiology

## 2016-03-26 ENCOUNTER — Encounter (HOSPITAL_COMMUNITY)
Admission: RE | Disposition: A | Discharge status: Home / Self Care | Payer: Self-pay | Source: Ambulatory Visit | Attending: Neurosurgery

## 2016-03-26 ENCOUNTER — Inpatient Hospital Stay (HOSPITAL_COMMUNITY): Payer: Medicare Other | Admitting: Anesthesiology

## 2016-03-26 ENCOUNTER — Inpatient Hospital Stay (HOSPITAL_COMMUNITY)
Admission: RE | Admit: 2016-03-26 | Discharge: 2016-03-27 | DRG: 516 | Disposition: A | Discharge status: Home / Self Care | Payer: Medicare Other | Source: Ambulatory Visit | Attending: Neurosurgery | Admitting: Neurosurgery

## 2016-03-26 DIAGNOSIS — M1990 Unspecified osteoarthritis, unspecified site: Secondary | ICD-10-CM | POA: Diagnosis not present

## 2016-03-26 DIAGNOSIS — M797 Fibromyalgia: Secondary | ICD-10-CM | POA: Diagnosis present

## 2016-03-26 DIAGNOSIS — M960 Pseudarthrosis after fusion or arthrodesis: Secondary | ICD-10-CM | POA: Diagnosis present

## 2016-03-26 DIAGNOSIS — I2510 Atherosclerotic heart disease of native coronary artery without angina pectoris: Secondary | ICD-10-CM | POA: Diagnosis present

## 2016-03-26 DIAGNOSIS — T84038A Mechanical loosening of other internal prosthetic joint, initial encounter: Secondary | ICD-10-CM | POA: Diagnosis not present

## 2016-03-26 DIAGNOSIS — Z981 Arthrodesis status: Secondary | ICD-10-CM | POA: Diagnosis not present

## 2016-03-26 DIAGNOSIS — K5900 Constipation, unspecified: Secondary | ICD-10-CM | POA: Diagnosis present

## 2016-03-26 DIAGNOSIS — Z79899 Other long term (current) drug therapy: Secondary | ICD-10-CM

## 2016-03-26 DIAGNOSIS — Y838 Other surgical procedures as the cause of abnormal reaction of the patient, or of later complication, without mention of misadventure at the time of the procedure: Secondary | ICD-10-CM | POA: Diagnosis present

## 2016-03-26 DIAGNOSIS — E119 Type 2 diabetes mellitus without complications: Secondary | ICD-10-CM | POA: Diagnosis present

## 2016-03-26 DIAGNOSIS — Z880 Allergy status to penicillin: Secondary | ICD-10-CM | POA: Diagnosis not present

## 2016-03-26 DIAGNOSIS — S32009K Unspecified fracture of unspecified lumbar vertebra, subsequent encounter for fracture with nonunion: Secondary | ICD-10-CM | POA: Diagnosis present

## 2016-03-26 DIAGNOSIS — G8929 Other chronic pain: Secondary | ICD-10-CM | POA: Diagnosis present

## 2016-03-26 DIAGNOSIS — I10 Essential (primary) hypertension: Secondary | ICD-10-CM | POA: Diagnosis present

## 2016-03-26 DIAGNOSIS — Z87891 Personal history of nicotine dependence: Secondary | ICD-10-CM | POA: Diagnosis not present

## 2016-03-26 DIAGNOSIS — M961 Postlaminectomy syndrome, not elsewhere classified: Secondary | ICD-10-CM | POA: Diagnosis not present

## 2016-03-26 DIAGNOSIS — M549 Dorsalgia, unspecified: Secondary | ICD-10-CM | POA: Diagnosis present

## 2016-03-26 DIAGNOSIS — G4700 Insomnia, unspecified: Secondary | ICD-10-CM | POA: Diagnosis present

## 2016-03-26 DIAGNOSIS — E876 Hypokalemia: Secondary | ICD-10-CM | POA: Diagnosis present

## 2016-03-26 DIAGNOSIS — E785 Hyperlipidemia, unspecified: Secondary | ICD-10-CM | POA: Diagnosis present

## 2016-03-26 DIAGNOSIS — F329 Major depressive disorder, single episode, unspecified: Secondary | ICD-10-CM | POA: Diagnosis present

## 2016-03-26 DIAGNOSIS — Z96652 Presence of left artificial knee joint: Secondary | ICD-10-CM | POA: Diagnosis present

## 2016-03-26 DIAGNOSIS — F419 Anxiety disorder, unspecified: Secondary | ICD-10-CM | POA: Diagnosis present

## 2016-03-26 DIAGNOSIS — Z7982 Long term (current) use of aspirin: Secondary | ICD-10-CM

## 2016-03-26 HISTORY — PX: HARDWARE REMOVAL: SHX979

## 2016-03-26 LAB — GLUCOSE, CAPILLARY
Glucose-Capillary: 104 mg/dL — ABNORMAL HIGH (ref 65–99)
Glucose-Capillary: 145 mg/dL — ABNORMAL HIGH (ref 65–99)

## 2016-03-26 SURGERY — REMOVAL, HARDWARE
Anesthesia: General | Site: Back

## 2016-03-26 MED ORDER — HYDROMORPHONE HCL 1 MG/ML IJ SOLN
INTRAMUSCULAR | Status: AC
  Filled 2016-03-26: qty 1

## 2016-03-26 MED ORDER — HYDROCHLOROTHIAZIDE 25 MG PO TABS: 25.0000 mg | ORAL_TABLET | Freq: Every day | ORAL | Status: DC

## 2016-03-26 MED ORDER — SIMVASTATIN 20 MG PO TABS
40.0000 mg | ORAL_TABLET | Freq: Every day | ORAL | Status: DC
  Administered 2016-03-26: 40 mg via ORAL
  Filled 2016-03-26: qty 2

## 2016-03-26 MED ORDER — BUPIVACAINE HCL (PF) 0.25 % IJ SOLN
INTRAMUSCULAR | Status: DC | PRN
  Administered 2016-03-26: 10 mL

## 2016-03-26 MED ORDER — CYCLOBENZAPRINE HCL 10 MG PO TABS
10.0000 mg | ORAL_TABLET | Freq: Three times a day (TID) | ORAL | Status: DC | PRN
  Administered 2016-03-26 – 2016-03-27 (×2): 10 mg via ORAL
  Filled 2016-03-26 (×2): qty 1

## 2016-03-26 MED ORDER — ONDANSETRON HCL 4 MG/2ML IJ SOLN
INTRAMUSCULAR | Status: AC
Start: 2016-03-26 — End: 2016-03-26
  Filled 2016-03-26: qty 2

## 2016-03-26 MED ORDER — HYDROMORPHONE HCL 1 MG/ML IJ SOLN
0.5000 mg | INTRAMUSCULAR | Status: DC | PRN
  Administered 2016-03-26: 1 mg via INTRAVENOUS
  Filled 2016-03-26: qty 1

## 2016-03-26 MED ORDER — ONDANSETRON HCL 4 MG/2ML IJ SOLN
INTRAMUSCULAR | Status: AC
  Filled 2016-03-26: qty 2

## 2016-03-26 MED ORDER — 0.9 % SODIUM CHLORIDE (POUR BTL) OPTIME
TOPICAL | Status: DC | PRN
  Administered 2016-03-26: 1000 mL

## 2016-03-26 MED ORDER — LIDOCAINE-EPINEPHRINE 1 %-1:100000 IJ SOLN
INTRAMUSCULAR | Status: DC | PRN
  Administered 2016-03-26: 10 mL

## 2016-03-26 MED ORDER — SODIUM CHLORIDE 0.9 % IR SOLN
Status: DC | PRN
  Administered 2016-03-26: 09:00:00

## 2016-03-26 MED ORDER — ROCURONIUM BROMIDE 50 MG/5ML IV SOLN
INTRAVENOUS | Status: AC
  Filled 2016-03-26: qty 1

## 2016-03-26 MED ORDER — ALPRAZOLAM 0.5 MG PO TABS: 0.5000 mg | ORAL_TABLET | Freq: Three times a day (TID) | ORAL | Status: DC | PRN

## 2016-03-26 MED ORDER — NITROGLYCERIN 0.4 MG SL SUBL: 0.4000 mg | SUBLINGUAL_TABLET | SUBLINGUAL | Status: DC | PRN

## 2016-03-26 MED ORDER — ROCURONIUM BROMIDE 100 MG/10ML IV SOLN
INTRAVENOUS | Status: DC | PRN
  Administered 2016-03-26: 50 mg via INTRAVENOUS

## 2016-03-26 MED ORDER — POTASSIUM CHLORIDE CRYS ER 10 MEQ PO TBCR: 10.0000 meq | EXTENDED_RELEASE_TABLET | ORAL | Status: DC

## 2016-03-26 MED ORDER — OXYCODONE HCL 5 MG PO TABS
ORAL_TABLET | ORAL | Status: AC
  Filled 2016-03-26: qty 2

## 2016-03-26 MED ORDER — ARTIFICIAL TEARS OP OINT
TOPICAL_OINTMENT | OPHTHALMIC | Status: DC | PRN
  Administered 2016-03-26: 1 via OPHTHALMIC

## 2016-03-26 MED ORDER — FENTANYL CITRATE (PF) 250 MCG/5ML IJ SOLN
INTRAMUSCULAR | Status: AC
  Filled 2016-03-26: qty 5

## 2016-03-26 MED ORDER — NAPROXEN 500 MG PO TABS
500.0000 mg | ORAL_TABLET | Freq: Two times a day (BID) | ORAL | Status: DC
  Administered 2016-03-26 – 2016-03-27 (×2): 500 mg via ORAL
  Filled 2016-03-26 (×2): qty 1

## 2016-03-26 MED ORDER — ACETAMINOPHEN 500 MG PO TABS: 500.0000 mg | ORAL_TABLET | Freq: Every day | ORAL | Status: DC | PRN

## 2016-03-26 MED ORDER — PHENYLEPHRINE 40 MCG/ML (10ML) SYRINGE FOR IV PUSH (FOR BLOOD PRESSURE SUPPORT)
PREFILLED_SYRINGE | INTRAVENOUS | Status: AC
  Filled 2016-03-26: qty 20

## 2016-03-26 MED ORDER — OXYCODONE HCL 5 MG PO TABS
10.0000 mg | ORAL_TABLET | Freq: Three times a day (TID) | ORAL | Status: DC | PRN
  Administered 2016-03-26: 10 mg via ORAL

## 2016-03-26 MED ORDER — SUGAMMADEX SODIUM 200 MG/2ML IV SOLN
INTRAVENOUS | Status: DC | PRN
  Administered 2016-03-26: 200 mg via INTRAVENOUS

## 2016-03-26 MED ORDER — ACETAMINOPHEN 325 MG PO TABS: 650.0000 mg | ORAL_TABLET | ORAL | Status: DC | PRN

## 2016-03-26 MED ORDER — SODIUM CHLORIDE 0.9% FLUSH
3.0000 mL | Freq: Two times a day (BID) | INTRAVENOUS | Status: DC
Start: 2016-03-26 — End: 2016-03-27
  Administered 2016-03-26 (×2): 3 mL via INTRAVENOUS

## 2016-03-26 MED ORDER — OXYCODONE-ACETAMINOPHEN 5-325 MG PO TABS
1.0000 | ORAL_TABLET | ORAL | Status: DC | PRN
  Administered 2016-03-26 – 2016-03-27 (×4): 2 via ORAL
  Filled 2016-03-26 (×4): qty 2

## 2016-03-26 MED ORDER — PHENYLEPHRINE HCL 10 MG/ML IJ SOLN
INTRAMUSCULAR | Status: DC | PRN
  Administered 2016-03-26: 200 ug via INTRAVENOUS
  Administered 2016-03-26 (×2): 120 ug via INTRAVENOUS
  Administered 2016-03-26: 160 ug via INTRAVENOUS
  Administered 2016-03-26: 80 ug via INTRAVENOUS
  Administered 2016-03-26: 140 ug via INTRAVENOUS
  Administered 2016-03-26: 120 ug via INTRAVENOUS
  Administered 2016-03-26: 160 ug via INTRAVENOUS
  Administered 2016-03-26: 80 ug via INTRAVENOUS
  Administered 2016-03-26: 120 ug via INTRAVENOUS

## 2016-03-26 MED ORDER — PHENYLEPHRINE 40 MCG/ML (10ML) SYRINGE FOR IV PUSH (FOR BLOOD PRESSURE SUPPORT)
PREFILLED_SYRINGE | INTRAVENOUS | Status: AC
  Filled 2016-03-26: qty 10

## 2016-03-26 MED ORDER — ACETAMINOPHEN 650 MG RE SUPP: 650.0000 mg | RECTAL | Status: DC | PRN

## 2016-03-26 MED ORDER — ONDANSETRON HCL 4 MG/2ML IJ SOLN: 4.0000 mg | INTRAMUSCULAR | Status: DC | PRN

## 2016-03-26 MED ORDER — PROPOFOL 10 MG/ML IV BOLUS
INTRAVENOUS | Status: DC | PRN
  Administered 2016-03-26: 200 mg via INTRAVENOUS

## 2016-03-26 MED ORDER — METHADONE HCL 10 MG PO TABS: 10.0000 mg | ORAL_TABLET | Freq: Three times a day (TID) | ORAL | Status: DC | PRN

## 2016-03-26 MED ORDER — SENNA 8.6 MG PO TABS
2.0000 | ORAL_TABLET | Freq: Every day | ORAL | Status: DC
  Administered 2016-03-26: 17.2 mg via ORAL
  Filled 2016-03-26: qty 2

## 2016-03-26 MED ORDER — ASPIRIN EC 325 MG PO TBEC
325.0000 mg | DELAYED_RELEASE_TABLET | Freq: Every day | ORAL | Status: DC
  Administered 2016-03-26: 325 mg via ORAL
  Filled 2016-03-26 (×2): qty 1

## 2016-03-26 MED ORDER — MIDAZOLAM HCL 5 MG/5ML IJ SOLN
INTRAMUSCULAR | Status: DC | PRN
  Administered 2016-03-26: 2 mg via INTRAVENOUS

## 2016-03-26 MED ORDER — LIDOCAINE HCL (CARDIAC) 20 MG/ML IV SOLN
INTRAVENOUS | Status: DC | PRN
  Administered 2016-03-26: 100 mg via INTRAVENOUS

## 2016-03-26 MED ORDER — HEMOSTATIC AGENTS (NO CHARGE) OPTIME
TOPICAL | Status: DC | PRN
  Administered 2016-03-26: 1 via TOPICAL

## 2016-03-26 MED ORDER — PHENOL 1.4 % MT LIQD: 1.0000 | OROMUCOSAL | Status: DC | PRN

## 2016-03-26 MED ORDER — MIDAZOLAM HCL 2 MG/2ML IJ SOLN
INTRAMUSCULAR | Status: AC
  Filled 2016-03-26: qty 2

## 2016-03-26 MED ORDER — LISINOPRIL 20 MG PO TABS: 20.0000 mg | ORAL_TABLET | Freq: Every day | ORAL | Status: DC

## 2016-03-26 MED ORDER — HYDROMORPHONE HCL 1 MG/ML IJ SOLN
0.2500 mg | INTRAMUSCULAR | Status: DC | PRN
  Administered 2016-03-26 (×4): 0.5 mg via INTRAVENOUS

## 2016-03-26 MED ORDER — LISINOPRIL-HYDROCHLOROTHIAZIDE 20-25 MG PO TABS: 1.0000 | ORAL_TABLET | Freq: Every morning | ORAL | Status: DC

## 2016-03-26 MED ORDER — THROMBIN 5000 UNITS EX SOLR
CUTANEOUS | Status: DC | PRN
  Administered 2016-03-26 (×2): 5000 [IU] via TOPICAL

## 2016-03-26 MED ORDER — MENTHOL 3 MG MT LOZG: 1.0000 | LOZENGE | OROMUCOSAL | Status: DC | PRN

## 2016-03-26 MED ORDER — LACTATED RINGERS IV SOLN
INTRAVENOUS | Status: DC | PRN
  Administered 2016-03-26: 08:00:00 via INTRAVENOUS

## 2016-03-26 MED ORDER — SODIUM CHLORIDE 0.9% FLUSH: 3.0000 mL | INTRAVENOUS | Status: DC | PRN

## 2016-03-26 MED ORDER — SUGAMMADEX SODIUM 200 MG/2ML IV SOLN
INTRAVENOUS | Status: AC
  Filled 2016-03-26: qty 2

## 2016-03-26 MED ORDER — ADULT MULTIVITAMIN W/MINERALS CH: 1.0000 | ORAL_TABLET | Freq: Every day | ORAL | Status: DC

## 2016-03-26 MED ORDER — FENTANYL CITRATE (PF) 100 MCG/2ML IJ SOLN
INTRAMUSCULAR | Status: DC | PRN
  Administered 2016-03-26: 250 ug via INTRAVENOUS

## 2016-03-26 MED ORDER — CEFAZOLIN SODIUM-DEXTROSE 2-4 GM/100ML-% IV SOLN
2.0000 g | Freq: Three times a day (TID) | INTRAVENOUS | Status: AC
  Administered 2016-03-26 (×2): 2 g via INTRAVENOUS
  Filled 2016-03-26 (×2): qty 100

## 2016-03-26 MED ORDER — ONDANSETRON HCL 4 MG/2ML IJ SOLN
INTRAMUSCULAR | Status: DC | PRN
  Administered 2016-03-26: 4 mg via INTRAVENOUS

## 2016-03-26 MED ORDER — PROPOFOL 10 MG/ML IV BOLUS
INTRAVENOUS | Status: AC
Start: 2016-03-26 — End: 2016-03-26
  Filled 2016-03-26: qty 20

## 2016-03-26 MED ORDER — TRAZODONE HCL 150 MG PO TABS
250.0000 mg | ORAL_TABLET | Freq: Every day | ORAL | Status: DC
  Administered 2016-03-26: 250 mg via ORAL
  Filled 2016-03-26: qty 1

## 2016-03-26 SURGICAL SUPPLY — 51 items
BAG DECANTER FOR FLEXI CONT (MISCELLANEOUS) ×3 IMPLANT
BENZOIN TINCTURE PRP APPL 2/3 (GAUZE/BANDAGES/DRESSINGS) ×3 IMPLANT
BLADE CLIPPER SURG (BLADE) ×3 IMPLANT
BRUSH SCRUB EZ PLAIN DRY (MISCELLANEOUS) ×3 IMPLANT
BUR MATCHSTICK NEURO 3.0 LAGG (BURR) ×3 IMPLANT
BUR PRECISION FLUTE 6.0 (BURR) IMPLANT
CANISTER SUCT 3000ML PPV (MISCELLANEOUS) ×3 IMPLANT
CLOSURE WOUND 1/2 X4 (GAUZE/BANDAGES/DRESSINGS) ×1
DECANTER SPIKE VIAL GLASS SM (MISCELLANEOUS) ×3 IMPLANT
DRAPE LAPAROTOMY 100X72X124 (DRAPES) ×3 IMPLANT
DRAPE POUCH INSTRU U-SHP 10X18 (DRAPES) ×3 IMPLANT
DRAPE PROXIMA HALF (DRAPES) ×3 IMPLANT
DRAPE SURG 17X23 STRL (DRAPES) ×3 IMPLANT
DRSG OPSITE POSTOP 4X6 (GAUZE/BANDAGES/DRESSINGS) ×3 IMPLANT
ELECT REM PT RETURN 9FT ADLT (ELECTROSURGICAL) ×3
ELECTRODE REM PT RTRN 9FT ADLT (ELECTROSURGICAL) ×1 IMPLANT
GAUZE SPONGE 4X4 12PLY STRL (GAUZE/BANDAGES/DRESSINGS) ×3 IMPLANT
GAUZE SPONGE 4X4 16PLY XRAY LF (GAUZE/BANDAGES/DRESSINGS) IMPLANT
GLOVE BIO SURGEON STRL SZ8 (GLOVE) ×3 IMPLANT
GLOVE BIOGEL PI IND STRL 6.5 (GLOVE) ×1 IMPLANT
GLOVE BIOGEL PI IND STRL 7.5 (GLOVE) ×1 IMPLANT
GLOVE BIOGEL PI INDICATOR 6.5 (GLOVE) ×2
GLOVE BIOGEL PI INDICATOR 7.5 (GLOVE) ×2
GLOVE EXAM NITRILE LRG STRL (GLOVE) IMPLANT
GLOVE EXAM NITRILE MD LF STRL (GLOVE) IMPLANT
GLOVE EXAM NITRILE XL STR (GLOVE) IMPLANT
GLOVE EXAM NITRILE XS STR PU (GLOVE) IMPLANT
GLOVE INDICATOR 8.5 STRL (GLOVE) ×3 IMPLANT
GLOVE SURG SS PI 6.5 STRL IVOR (GLOVE) ×6 IMPLANT
GOWN STRL REUS W/ TWL LRG LVL3 (GOWN DISPOSABLE) ×1 IMPLANT
GOWN STRL REUS W/ TWL XL LVL3 (GOWN DISPOSABLE) ×1 IMPLANT
GOWN STRL REUS W/TWL 2XL LVL3 (GOWN DISPOSABLE) IMPLANT
GOWN STRL REUS W/TWL LRG LVL3 (GOWN DISPOSABLE) ×2
GOWN STRL REUS W/TWL XL LVL3 (GOWN DISPOSABLE) ×2
KIT BASIN OR (CUSTOM PROCEDURE TRAY) ×3 IMPLANT
KIT ROOM TURNOVER OR (KITS) ×3 IMPLANT
LIQUID BAND (GAUZE/BANDAGES/DRESSINGS) ×3 IMPLANT
NEEDLE HYPO 22GX1.5 SAFETY (NEEDLE) ×3 IMPLANT
NEEDLE SPNL 22GX3.5 QUINCKE BK (NEEDLE) ×3 IMPLANT
NS IRRIG 1000ML POUR BTL (IV SOLUTION) ×3 IMPLANT
PACK LAMINECTOMY NEURO (CUSTOM PROCEDURE TRAY) ×3 IMPLANT
RASP 3.0MM (RASP) ×3 IMPLANT
SPONGE SURGIFOAM ABS GEL SZ50 (HEMOSTASIS) ×3 IMPLANT
STRIP CLOSURE SKIN 1/2X4 (GAUZE/BANDAGES/DRESSINGS) ×2 IMPLANT
SUT VIC AB 0 CT1 18XCR BRD8 (SUTURE) ×1 IMPLANT
SUT VIC AB 0 CT1 8-18 (SUTURE) ×2
SUT VIC AB 2-0 CT1 18 (SUTURE) ×3 IMPLANT
SUT VICRYL 4-0 PS2 18IN ABS (SUTURE) ×3 IMPLANT
TOWEL OR 17X24 6PK STRL BLUE (TOWEL DISPOSABLE) ×3 IMPLANT
TOWEL OR 17X26 10 PK STRL BLUE (TOWEL DISPOSABLE) ×3 IMPLANT
WATER STERILE IRR 1000ML POUR (IV SOLUTION) ×3 IMPLANT

## 2016-03-26 NOTE — Op Note (Signed)
Preoperative Diagnosis: Pseudoarthrosis with loose screws L1 and painful hardware  Postoperative diagnosis: Same  Procedure: Expiration of fusion removal of hardware L1-L2 with cutting of the rod between L1-L2 and removal of bilateral L1 loose screws.  Surgeon: Dominica Severin Crespin Forstrom  Anesthesia: Gen.  EBL: Minimal  History of present illness: Patient is a very pleasant 70 year old woman has had long-standing back pain lumbar spondylosis status post L1-L5 fusion saw the fused L2-L5 however developed loosening of his L1 screws. He had not previously had a decompression and interbody fusion at L1-L2 so was not inherently unstable and due to progressive pain loosening of the screws and lack of inherent instability I recommended just removal of hardware at L1. I extensively went over the risks and benefits of the operation the patient as well as perioperative course expectations of outcome and alternatives to surgery and he understood and agreed to proceed forward.  Operative procedure: Patient brought into the or was induced under general anesthesia positioned prone on the [facility] table his back was prepped and draped in routine sterile fashion. Super aspect of his old incision was infiltrated with 10 mL lidocaine with epi and opened up and the scar tissues dissected free exposing the hardware at L1 and L2. The fusion appeared to be solid below L2 but definitely was some laxity in the screws and some serous-type fluid around the screw heads at L1. I then put a titanium cutting drill bit on the drill and cut the rod just behind the L1 screw and the screws were hypermobile at this point then removed the nuts remove the rods removed screws wax in the holes and copiously irrigated the wound and closed with interrupted Vicryls and a running 4 subcuticular Dermabond benzo and Steri-Strips were placed on the incision and patient recovered in stable condition. At the end of case all needle counts sponge counts were correct.

## 2016-03-26 NOTE — H&P (Signed)
Michael Barr is an 70 y.o. male.   Chief Complaint: Back pain HPI: Patient is very pleasant 70 year old gentleman is undergone previous L1-L5 fusion over a couple different operations developed progressive worsening back pain and workup has revealed loosening of his L1 screws with solid and intact construct from L2-L5 no evidence of significant displacement patient has not been decompress across the L1-2 disc. Because of the progressive back pain solid fusion below L2 loosening of hardware at L1 without radicular symptoms and without worsening stenosis of recommended just simple hardware removal L1-L2 cutting the rod and removing the L1 screws. I've extensively gone over the risks and benefits of the procedure with the patient as well as perioperative course expectations of outcome and alternatives of surgery and he understands and agrees to proceed forward.  Past Medical History  Diagnosis Date  . Depression     attacks  . Arthritis   . Coronary artery disease   . Hypertension     takes Zestoretic daily  . Hyperlipidemia     takes Zocor daily  . Asthma 70's  . Pneumonia 70's  . Weakness     right leg  . Fibromyalgia   . Joint pain   . Joint swelling   . Chronic back pain     HNP  . Constipation     takes Sennokot nightly  . Nocturia   . History of kidney stones   . Diabetes mellitus without complication (HCC)     borderline   . Hypokalemia     takes Potassium daily  . Cataract     immature and to the left eye  . Anxiety     takes Xanax and Methadone daily  . Insomnia     takes trazodone nightly    Past Surgical History  Procedure Laterality Date  . Shoulder arthroscopy Bilateral     left x 2 and right x 1  . Lumbar laminectomy/decompression microdiscectomy Right 09/26/2013    Procedure: LUMBAR LAMINECTOMY/DECOMPRESSION MICRODISCECTOMY RIGHT THREE-FOUR;  Surgeon: Michael Hoops, MD;  Location: [place] [facility];  Service: Neurosurgery;  Laterality: Right;  . Coronary artery  bypass graft  1996    1 vessel  . Joint replacement  1999    lt knee  . Cholecystectomy  1985  . Carpal tunnel release Bilateral 70's  . 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 Tonsillectomy    . Knee arthroscopy Right   . Pilondial cyst      x 2   . Colonoscopy    . Cardiac catheterization      unsure of how long ago    History reviewed. No pertinent family history. Social History:  reports that he has quit smoking. He has never used smokeless tobacco. He reports that he does not drink alcohol or use illicit drugs.  Allergies:  Allergies  Allergen Reactions  . Penicillins Rash    Has patient had a PCN reaction causing immediate rash, facial/tongue/throat swelling, SOB or lightheadedness with hypotension: Yes Has patient had a PCN reaction causing severe rash involving mucus membranes or skin necrosis: No Has patient had a PCN reaction that required hospitalization No Has patient had a PCN reaction occurring within the last 10 years: No If all of the above answers are "NO", then may proceed with Cephalosporin use.    Medications Prior to Admission  Medication Sig Dispense Refill  . acetaminophen (TYLENOL) 500 MG tablet Take 500 mg by mouth daily as needed for mild pain.     Marland Kitchen  ALPRAZolam (XANAX) 0.5 MG tablet Take 0.5 mg by mouth 3 (three) times daily as needed for anxiety.    Marland Kitchen aspirin EC 325 MG tablet Take 325 mg by mouth daily.    Marland Kitchen lisinopril-hydrochlorothiazide (PRINZIDE,ZESTORETIC) 20-25 MG per tablet Take 1 tablet by mouth every morning.     . methadone (DOLOPHINE) 10 MG tablet Take 10 mg by mouth every 8 (eight) hours as needed (for anxiety and pain).     . Multiple Vitamins-Minerals (MULTIVITAMIN WITH MINERALS) tablet Take 1 tablet by mouth daily.    . naproxen (NAPROSYN) 500 MG tablet Take 500 mg by mouth 2 (two) times daily with a meal.    . nitroGLYCERIN (NITROSTAT) 0.4 MG SL tablet Place 0.4 mg under the tongue every 5 (five) minutes as needed for chest pain.    Marland Kitchen  oxyCODONE 10 MG TABS Take 1 tablet (10 mg total) by mouth 3 (three) times daily as needed (severe pain). 60 tablet 0  . potassium gluconate 595 MG TABS tablet Take 1,190 mg by mouth daily.     Marland Kitchen senna (SENOKOT) 8.6 MG TABS tablet Take 2 tablets by mouth at bedtime.    . simvastatin (ZOCOR) 40 MG tablet Take 40 mg by mouth at bedtime.    . traZODone (DESYREL) 100 MG tablet Take 250 mg by mouth at bedtime.      No results found for this or any previous visit (from the past 48 hour(s)). No results found.  Review of Systems  Constitutional: Negative.   HENT: Negative.   Eyes: Negative.   Respiratory: Negative.   Cardiovascular: Negative.   Gastrointestinal: Negative.   Genitourinary: Negative.   Musculoskeletal: Positive for myalgias and back pain.  Skin: Negative.   Neurological: Negative.     Blood pressure 131/67, pulse 77, resp. rate 20, height 5\' 9"  (1.753 m), weight 135.626 kg (299 lb), SpO2 97 %. Physical Exam  Constitutional: He is oriented to person, place, and time. He appears well-developed and well-nourished.  HENT:  Head: Normocephalic.  Eyes: Pupils are equal, round, and reactive to light.  Neck: Normal range of motion.  Respiratory: Effort normal.  GI: Soft. Bowel sounds are normal.  Neurological: He is alert and oriented to person, place, and time. GCS eye subscore is 4. GCS verbal subscore is 5. GCS motor subscore is 6.  Strength is 5 out of 5 in his iliopsoas, quads, hip she's, gastric, and tibialis, and EHL.     Assessment/Plan 70 year old gentleman presents for hardware removal L1-L2  [name] P, MD 03/26/2016, 7:19 AM

## 2016-03-26 NOTE — Anesthesia Preprocedure Evaluation (Signed)
Anesthesia Evaluation  Patient identified by MRN, date of birth, ID band Patient awake    Reviewed: Allergy & Precautions, H&P , Patient's Chart, lab work & pertinent test results, reviewed documented beta blocker date and time   Airway Mallampati: II  TM Distance: >3 FB Neck ROM: full    Dental no notable dental hx.    Pulmonary former smoker,    Pulmonary exam normal breath sounds clear to auscultation       Cardiovascular hypertension, On Medications + CAD   Rhythm:regular Rate:Normal     Neuro/Psych    GI/Hepatic   Endo/Other  diabetes  Renal/GU      Musculoskeletal   Abdominal   Peds  Hematology   Anesthesia Other Findings   Reproductive/Obstetrics                             Anesthesia Physical Anesthesia Plan  ASA: II  Anesthesia Plan: General   Post-op Pain Management:    Induction: Intravenous  Airway Management Planned: Oral ETT  Additional Equipment:   Intra-op Plan:   Post-operative Plan: Extubation in OR  Informed Consent: I have reviewed the patients History and Physical, chart, labs and discussed the procedure including the risks, benefits and alternatives for the proposed anesthesia with the patient or authorized representative who has indicated his/her understanding and acceptance.   Dental Advisory Given and Dental advisory given  Plan Discussed with: CRNA and Surgeon  Anesthesia Plan Comments: (  Discussed general anesthesia, including possible nausea, instrumentation of airway, sore throat,pulmonary aspiration, etc. I asked if the were any outstanding questions, or  concerns before we proceeded. )        Anesthesia Quick Evaluation

## 2016-03-26 NOTE — Anesthesia Postprocedure Evaluation (Signed)
Anesthesia Post Note  Patient: Michael Barr  Procedure(s) Performed: Procedure(s) (LRB): HARDWARE REMOVAL Lumbar one-two  (N/A)  Patient location during evaluation: PACU Anesthesia Type: General Level of consciousness: sedated Pain management: satisfactory to patient Vital Signs Assessment: post-procedure vital signs reviewed and stable Respiratory status: spontaneous breathing Cardiovascular status: stable Anesthetic complications: no Comments: Doing well No complaints    Last Vitals:  Filed Vitals:   03/26/16 1015 03/26/16 1030  BP:    Pulse: 73   Temp:  36.5 C  Resp:      Last Pain:  Filed Vitals:   03/26/16 1041  PainSc: Asleep                 [place]

## 2016-03-26 NOTE — Transfer of Care (Signed)
Immediate Anesthesia Transfer of Care Note  Patient: Michael Barr  Procedure(s) Performed: Procedure(s): HARDWARE REMOVAL Lumbar one-two  (N/A)  Patient Location: PACU  Anesthesia Type:General  Level of Consciousness: awake, alert , oriented and patient cooperative  Airway & Oxygen Therapy: Patient Spontanous Breathing and Patient connected to nasal cannula oxygen  Post-op Assessment: Report given to RN and Post -op Vital signs reviewed and stable  Post vital signs: Reviewed and stable  Last Vitals:  Filed Vitals:   03/26/16 0644 03/26/16 0719  BP: 131/67   Pulse: 77   Temp:  36.7 C  Resp: 20     Last Pain:  Filed Vitals:   03/26/16 0720  PainSc: 5          Complications: No apparent anesthesia complications

## 2016-03-27 MED ORDER — CYCLOBENZAPRINE HCL 10 MG PO TABS: 10.0000 mg | ORAL_TABLET | Freq: Three times a day (TID) | ORAL | Status: DC | PRN

## 2016-03-27 MED ORDER — OXYCODONE HCL 10 MG PO TABS: 10.0000 mg | ORAL_TABLET | Freq: Three times a day (TID) | ORAL | Status: DC | PRN

## 2016-03-27 NOTE — Discharge Instructions (Signed)

## 2016-03-27 NOTE — Progress Notes (Signed)
PT Cancellation Note  Patient Details Name: Michael Barr MRN: [id number] DOB: 04/20/1946   Cancelled Treatment:    Reason Eval/Treat Not Completed: PT screened, no needs identified, will sign off.  Patient and wife report that patient has been walking, has all equipment and has no PT or OT needs.  Will sign off.   Shanna Cisco 03/27/2016, 8:53 AM

## 2016-03-27 NOTE — Progress Notes (Signed)
Pt doing well. Pt and wife given D/C instructions with Rx's, verbal understanding was provided. Pt's IV was removed prior to D/C. Pt's incision is covered with Honeycomb dressing and has no sign of infection. Pt D/C'd home via wheelchair @ 0915 per MD order. Pt is stable @ D/C and has no other needs at this time. Holli Humbles, RN

## 2016-03-27 NOTE — Discharge Summary (Signed)
Date of Admission: 03/26/2016  Date of Discharge: 03/27/16  Preoperative Diagnosis: Pseudoarthrosis with loose screws L1 and painful hardware  Postoperative diagnosis: Same  Procedure: Expiration of fusion removal of hardware L1-L2 with cutting of the rod between L1-L2 and removal of bilateral L1 loose screws.  Attending: Kary Kos, MD  Hospital Course:  The patient is admitted for the above listed operation. They tolerated that well. Had an uneventful postoperative course. There were discharged home in stable condition.  Follow up: 3 weeks    Medication List    TAKE these medications        acetaminophen 500 MG tablet  Commonly known as:  TYLENOL  Take 500 mg by mouth daily as needed for mild pain.     ALPRAZolam 0.5 MG tablet  Commonly known as:  XANAX  Take 0.5 mg by mouth 3 (three) times daily as needed for anxiety.     aspirin EC 325 MG tablet  Take 325 mg by mouth daily.     cyclobenzaprine 10 MG tablet  Commonly known as:  FLEXERIL  Take 1 tablet (10 mg total) by mouth 3 (three) times daily as needed for muscle spasms.     lisinopril-hydrochlorothiazide 20-25 MG tablet  Commonly known as:  PRINZIDE,ZESTORETIC  Take 1 tablet by mouth every morning.     methadone 10 MG tablet  Commonly known as:  DOLOPHINE  Take 10 mg by mouth every 8 (eight) hours as needed (for anxiety and pain).     multivitamin with minerals tablet  Take 1 tablet by mouth daily.     naproxen 500 MG tablet  Commonly known as:  NAPROSYN  Take 500 mg by mouth 2 (two) times daily with a meal.     nitroGLYCERIN 0.4 MG SL tablet  Commonly known as:  NITROSTAT  Place 0.4 mg under the tongue every 5 (five) minutes as needed for chest pain.     Oxycodone HCl 10 MG Tabs  Take 1 tablet (10 mg total) by mouth 3 (three) times daily as needed (severe pain).     Oxycodone HCl 10 MG Tabs  Take 1 tablet (10 mg total) by mouth 3 (three) times daily as needed (severe pain).     potassium gluconate 595  (99 K) MG Tabs tablet  Take 1,190 mg by mouth daily.     senna 8.6 MG Tabs tablet  Commonly known as:  SENOKOT  Take 2 tablets by mouth at bedtime.     simvastatin 40 MG tablet  Commonly known as:  ZOCOR  Take 40 mg by mouth at bedtime.     traZODone 100 MG tablet  Commonly known as:  DESYREL  Take 250 mg by mouth at bedtime.

## 2016-03-29 ENCOUNTER — Encounter (HOSPITAL_COMMUNITY): Payer: Self-pay | Admitting: Neurosurgery

## 2016-06-14 DIAGNOSIS — M4806 Spinal stenosis, lumbar region: Secondary | ICD-10-CM | POA: Diagnosis not present

## 2016-06-14 DIAGNOSIS — E785 Hyperlipidemia, unspecified: Secondary | ICD-10-CM | POA: Diagnosis not present

## 2016-06-14 DIAGNOSIS — I10 Essential (primary) hypertension: Secondary | ICD-10-CM | POA: Diagnosis not present

## 2016-06-14 DIAGNOSIS — D649 Anemia, unspecified: Secondary | ICD-10-CM | POA: Diagnosis not present

## 2016-06-14 DIAGNOSIS — E119 Type 2 diabetes mellitus without complications: Secondary | ICD-10-CM | POA: Diagnosis not present

## 2016-06-14 DIAGNOSIS — D638 Anemia in other chronic diseases classified elsewhere: Secondary | ICD-10-CM | POA: Diagnosis not present

## 2016-06-23 DIAGNOSIS — G894 Chronic pain syndrome: Secondary | ICD-10-CM | POA: Diagnosis not present

## 2016-06-23 DIAGNOSIS — M5137 Other intervertebral disc degeneration, lumbosacral region: Secondary | ICD-10-CM | POA: Diagnosis not present

## 2016-08-10 DIAGNOSIS — I10 Essential (primary) hypertension: Secondary | ICD-10-CM | POA: Diagnosis not present

## 2016-08-10 DIAGNOSIS — M5137 Other intervertebral disc degeneration, lumbosacral region: Secondary | ICD-10-CM | POA: Diagnosis not present

## 2016-08-10 DIAGNOSIS — Z6841 Body Mass Index (BMI) 40.0 and over, adult: Secondary | ICD-10-CM | POA: Diagnosis not present

## 2016-08-26 DIAGNOSIS — M545 Low back pain: Secondary | ICD-10-CM | POA: Diagnosis not present

## 2016-08-26 DIAGNOSIS — M5137 Other intervertebral disc degeneration, lumbosacral region: Secondary | ICD-10-CM | POA: Diagnosis not present

## 2016-08-31 DIAGNOSIS — M5137 Other intervertebral disc degeneration, lumbosacral region: Secondary | ICD-10-CM | POA: Diagnosis not present

## 2016-09-02 DIAGNOSIS — M5137 Other intervertebral disc degeneration, lumbosacral region: Secondary | ICD-10-CM | POA: Diagnosis not present

## 2016-09-07 DIAGNOSIS — M5137 Other intervertebral disc degeneration, lumbosacral region: Secondary | ICD-10-CM | POA: Diagnosis not present

## 2016-09-09 DIAGNOSIS — H5052 Exophoria: Secondary | ICD-10-CM | POA: Diagnosis not present

## 2016-09-09 DIAGNOSIS — H2513 Age-related nuclear cataract, bilateral: Secondary | ICD-10-CM | POA: Diagnosis not present

## 2016-09-09 DIAGNOSIS — M5137 Other intervertebral disc degeneration, lumbosacral region: Secondary | ICD-10-CM | POA: Diagnosis not present

## 2016-09-09 DIAGNOSIS — R7309 Other abnormal glucose: Secondary | ICD-10-CM | POA: Diagnosis not present

## 2016-09-14 DIAGNOSIS — M5137 Other intervertebral disc degeneration, lumbosacral region: Secondary | ICD-10-CM | POA: Diagnosis not present

## 2016-09-21 DIAGNOSIS — Z6841 Body Mass Index (BMI) 40.0 and over, adult: Secondary | ICD-10-CM | POA: Diagnosis not present

## 2016-09-21 DIAGNOSIS — M5137 Other intervertebral disc degeneration, lumbosacral region: Secondary | ICD-10-CM | POA: Diagnosis not present

## 2016-09-21 DIAGNOSIS — M5136 Other intervertebral disc degeneration, lumbar region: Secondary | ICD-10-CM | POA: Diagnosis not present

## 2016-09-21 DIAGNOSIS — I10 Essential (primary) hypertension: Secondary | ICD-10-CM | POA: Diagnosis not present

## 2016-09-22 DIAGNOSIS — M5137 Other intervertebral disc degeneration, lumbosacral region: Secondary | ICD-10-CM | POA: Diagnosis not present

## 2016-09-22 DIAGNOSIS — G894 Chronic pain syndrome: Secondary | ICD-10-CM | POA: Diagnosis not present

## 2016-09-23 DIAGNOSIS — M5137 Other intervertebral disc degeneration, lumbosacral region: Secondary | ICD-10-CM | POA: Diagnosis not present

## 2016-09-28 DIAGNOSIS — M5137 Other intervertebral disc degeneration, lumbosacral region: Secondary | ICD-10-CM | POA: Diagnosis not present

## 2016-09-30 DIAGNOSIS — M5137 Other intervertebral disc degeneration, lumbosacral region: Secondary | ICD-10-CM | POA: Diagnosis not present

## 2016-10-05 DIAGNOSIS — M5137 Other intervertebral disc degeneration, lumbosacral region: Secondary | ICD-10-CM | POA: Diagnosis not present

## 2016-10-07 DIAGNOSIS — M5137 Other intervertebral disc degeneration, lumbosacral region: Secondary | ICD-10-CM | POA: Diagnosis not present

## 2016-10-14 DIAGNOSIS — D638 Anemia in other chronic diseases classified elsewhere: Secondary | ICD-10-CM | POA: Diagnosis not present

## 2016-10-14 DIAGNOSIS — J40 Bronchitis, not specified as acute or chronic: Secondary | ICD-10-CM | POA: Diagnosis not present

## 2016-10-14 DIAGNOSIS — D649 Anemia, unspecified: Secondary | ICD-10-CM | POA: Diagnosis not present

## 2016-10-14 DIAGNOSIS — E785 Hyperlipidemia, unspecified: Secondary | ICD-10-CM | POA: Diagnosis not present

## 2016-10-14 DIAGNOSIS — Z125 Encounter for screening for malignant neoplasm of prostate: Secondary | ICD-10-CM | POA: Diagnosis not present

## 2016-10-14 DIAGNOSIS — M5137 Other intervertebral disc degeneration, lumbosacral region: Secondary | ICD-10-CM | POA: Diagnosis not present

## 2016-10-14 DIAGNOSIS — Z0000 Encounter for general adult medical examination without abnormal findings: Secondary | ICD-10-CM | POA: Diagnosis not present

## 2016-10-14 DIAGNOSIS — I10 Essential (primary) hypertension: Secondary | ICD-10-CM | POA: Diagnosis not present

## 2016-10-14 DIAGNOSIS — E119 Type 2 diabetes mellitus without complications: Secondary | ICD-10-CM | POA: Diagnosis not present

## 2016-10-21 DIAGNOSIS — M5137 Other intervertebral disc degeneration, lumbosacral region: Secondary | ICD-10-CM | POA: Diagnosis not present

## 2016-10-26 DIAGNOSIS — M5137 Other intervertebral disc degeneration, lumbosacral region: Secondary | ICD-10-CM | POA: Diagnosis not present

## 2016-10-28 DIAGNOSIS — M5137 Other intervertebral disc degeneration, lumbosacral region: Secondary | ICD-10-CM | POA: Diagnosis not present

## 2016-11-05 DIAGNOSIS — Z23 Encounter for immunization: Secondary | ICD-10-CM | POA: Diagnosis not present

## 2016-12-21 DIAGNOSIS — M5416 Radiculopathy, lumbar region: Secondary | ICD-10-CM | POA: Diagnosis not present

## 2016-12-22 ENCOUNTER — Other Ambulatory Visit: Payer: Self-pay | Admitting: Student

## 2016-12-22 DIAGNOSIS — M5416 Radiculopathy, lumbar region: Secondary | ICD-10-CM

## 2016-12-29 DIAGNOSIS — G2581 Restless legs syndrome: Secondary | ICD-10-CM | POA: Diagnosis not present

## 2016-12-29 DIAGNOSIS — M797 Fibromyalgia: Secondary | ICD-10-CM | POA: Diagnosis not present

## 2016-12-29 DIAGNOSIS — G894 Chronic pain syndrome: Secondary | ICD-10-CM | POA: Diagnosis not present

## 2016-12-29 DIAGNOSIS — M5137 Other intervertebral disc degeneration, lumbosacral region: Secondary | ICD-10-CM | POA: Diagnosis not present

## 2016-12-30 ENCOUNTER — Ambulatory Visit
Admission: RE | Admit: 2016-12-30 | Discharge: 2016-12-30 | Disposition: A | Discharge status: Home / Self Care | Payer: Medicare Other | Source: Ambulatory Visit | Attending: Student | Admitting: Student

## 2016-12-30 DIAGNOSIS — M5416 Radiculopathy, lumbar region: Secondary | ICD-10-CM

## 2016-12-30 DIAGNOSIS — M545 Low back pain: Secondary | ICD-10-CM | POA: Diagnosis not present

## 2017-01-06 ENCOUNTER — Other Ambulatory Visit: Payer: Self-pay | Admitting: Neurosurgery

## 2017-01-06 DIAGNOSIS — M5137 Other intervertebral disc degeneration, lumbosacral region: Secondary | ICD-10-CM | POA: Diagnosis not present

## 2017-01-06 DIAGNOSIS — M5126 Other intervertebral disc displacement, lumbar region: Secondary | ICD-10-CM | POA: Diagnosis not present

## 2017-01-07 ENCOUNTER — Other Ambulatory Visit: Payer: Self-pay | Admitting: Neurosurgery

## 2017-01-07 DIAGNOSIS — M5126 Other intervertebral disc displacement, lumbar region: Secondary | ICD-10-CM

## 2017-01-07 DIAGNOSIS — M5137 Other intervertebral disc degeneration, lumbosacral region: Secondary | ICD-10-CM

## 2017-01-12 ENCOUNTER — Ambulatory Visit
Admission: RE | Admit: 2017-01-12 | Discharge: 2017-01-12 | Disposition: A | Discharge status: Home / Self Care | Payer: Medicare Other | Source: Ambulatory Visit | Attending: Neurosurgery | Admitting: Neurosurgery

## 2017-01-12 DIAGNOSIS — M545 Low back pain: Secondary | ICD-10-CM | POA: Diagnosis not present

## 2017-01-12 DIAGNOSIS — M5137 Other intervertebral disc degeneration, lumbosacral region: Secondary | ICD-10-CM

## 2017-01-12 DIAGNOSIS — M546 Pain in thoracic spine: Secondary | ICD-10-CM | POA: Diagnosis not present

## 2017-01-20 ENCOUNTER — Encounter (HOSPITAL_COMMUNITY): Payer: Self-pay

## 2017-01-20 ENCOUNTER — Encounter (HOSPITAL_COMMUNITY)
Admission: RE | Admit: 2017-01-20 | Discharge: 2017-01-20 | Disposition: A | Discharge status: Home / Self Care | Payer: Medicare Other | Source: Ambulatory Visit | Attending: Neurosurgery | Admitting: Neurosurgery

## 2017-01-20 DIAGNOSIS — Z01810 Encounter for preprocedural cardiovascular examination: Secondary | ICD-10-CM | POA: Diagnosis not present

## 2017-01-20 DIAGNOSIS — I498 Other specified cardiac arrhythmias: Secondary | ICD-10-CM | POA: Insufficient documentation

## 2017-01-20 DIAGNOSIS — Z01812 Encounter for preprocedural laboratory examination: Secondary | ICD-10-CM | POA: Insufficient documentation

## 2017-01-20 DIAGNOSIS — M5136 Other intervertebral disc degeneration, lumbar region: Secondary | ICD-10-CM | POA: Insufficient documentation

## 2017-01-20 LAB — TYPE AND SCREEN
ABO/RH(D): A POS
Antibody Screen: NEGATIVE

## 2017-01-20 LAB — CBC
HCT: 42.2 % (ref 39.0–52.0)
HEMOGLOBIN: 13.8 g/dL (ref 13.0–17.0)
MCH: 29 pg (ref 26.0–34.0)
MCHC: 32.7 g/dL (ref 30.0–36.0)
MCV: 88.7 fL (ref 78.0–100.0)
Platelets: 230 10*3/uL (ref 150–400)
RBC: 4.76 MIL/uL (ref 4.22–5.81)
RDW: 14.2 % (ref 11.5–15.5)
WBC: 8.5 10*3/uL (ref 4.0–10.5)

## 2017-01-20 LAB — BASIC METABOLIC PANEL
ANION GAP: 10 (ref 5–15)
BUN: 18 mg/dL (ref 6–20)
CO2: 26 mmol/L (ref 22–32)
Calcium: 9.7 mg/dL (ref 8.9–10.3)
Chloride: 101 mmol/L (ref 101–111)
Creatinine, Ser: 1.05 mg/dL (ref 0.61–1.24)
GFR calc Af Amer: >60 mL/min (ref >60)
GFR calc non Af Amer: >60 mL/min (ref >60)
GLUCOSE: 96 mg/dL (ref 65–99)
Potassium: 4.3 mmol/L (ref 3.5–5.1)
Sodium: 137 mmol/L (ref 135–145)

## 2017-01-20 LAB — SURGICAL PCR SCREEN
MRSA, PCR: NEGATIVE
Staphylococcus aureus: POSITIVE — AB

## 2017-01-20 NOTE — Progress Notes (Signed)
I called a prescription for Mupirocin ointment to [facility], [address], [place].

## 2017-01-20 NOTE — Progress Notes (Signed)
   01/20/17 1401  OBSTRUCTIVE SLEEP APNEA  Have you ever been diagnosed with sleep apnea through a sleep study? No  Do you snore loudly (loud enough to be heard through closed doors)?  0  Do you often feel tired, fatigued, or sleepy during the daytime (such as falling asleep during driving or talking to someone)? 0  Has anyone observed you stop breathing during your sleep? 0  Do you have, or are you being treated for high blood pressure? 1  BMI more than 35 kg/m2? 1  Age > 50 (1-yes) 1  Neck circumference greater than:Male 16 inches or larger, Male 17inches or larger? 1  Male Gender (Yes=1) 1  Obstructive Sleep Apnea Score 5  Score 5 or greater  Results sent to PCP

## 2017-01-20 NOTE — Progress Notes (Signed)
Pt. Has h/o CABG (1996- at [facility] ) for a widow maker- per pt.   Pt. Is followed by Dr. Doreene Adas & had stress test 2016. Pt. Denies all chest concerns.

## 2017-01-20 NOTE — Pre-Procedure Instructions (Signed)
Michael Barr  01/20/2017      [facility] [address] [place] [address] Phone: [phone] Fax: [phone]  [id number] [name], [place] [address] Phone: [phone] Fax: [phone]    Your procedure is scheduled on 01/28/2017  Report to [facility] Admitting at 5:30 A.M.  Call this number if you have problems the morning of surgery:  [phone]   Remember:  Do not eat food or drink liquids after midnight.  On Thursday     Take these medicines the morning of surgery with A SIP OF WATER : Tylenol if needed, xanax, methadone, oxycodone, cyclobenzaprine,    Do not wear jewelry   Do not wear lotions, powders, or perfumes, or deoderant.    Men may shave face and neck.   Do not bring valuables to the hospital.   [facility] is not responsible for any belongings or valuables.  Contacts, dentures or bridgework may not be worn into surgery.  Leave your suitcase in the car.  After surgery it may be brought to your room.  For patients admitted to the hospital, discharge time will be determined by your treatment team.  Patients discharged the day of surgery will not be allowed to drive home.   Name and phone number of your driver:   With family  Special instructions:  Special Instructions: [place] - Preparing for Surgery  Before surgery, you can play an important role.  Because skin is not sterile, your skin needs to be as free of germs as possible.  You can reduce the number of germs on you skin by washing with CHG (chlorahexidine gluconate) soap before surgery.  CHG is an antiseptic cleaner which kills germs and bonds with the skin to continue killing germs even after washing.  Please DO NOT use if you have an allergy to CHG or antibacterial soaps.  If your skin becomes reddened/irritated stop using the CHG and inform your nurse  when you arrive at Short Stay.  Do not shave (including legs and underarms) for at least 48 hours prior to the first CHG shower.  You may shave your face.  Please follow these instructions carefully:   1.  Shower with CHG Soap the night before surgery and the  morning of Surgery.  2.  If you choose to wash your hair, wash your hair first as usual with your  normal shampoo.  3.  After you shampoo, rinse your hair and body thoroughly to remove the  Shampoo.  4.  Use CHG as you would any other liquid soap.  You can apply chg directly to the skin and wash gently with scrungie or a clean washcloth.  5.  Apply the CHG Soap to your body ONLY FROM THE NECK DOWN.    Do not use on open wounds or open sores.  Avoid contact with your eyes, ears, mouth and genitals (private parts).  Wash genitals (private parts)   with your normal soap.  6.  Wash thoroughly, paying special attention to the area where your surgery will be performed.  7.  Thoroughly rinse your body with warm water from the neck down.  8.  DO NOT shower/wash with your normal soap after using and rinsing off   the CHG Soap.  9.  Pat yourself dry with a clean towel.  10.  Wear clean pajamas.            11.  Place clean sheets on your bed the night of your first shower and do not sleep with pets.  Day of Surgery  Do not apply any lotions/deodorants the morning of surgery.  Please wear clean clothes to the hospital/surgery center.  Please read over the following fact sheets that you were given. Pain Booklet, Coughing and Deep Breathing, MRSA Information and Surgical Site Infection Prevention

## 2017-01-21 NOTE — Progress Notes (Addendum)
Anesthesia Chart Review: Patient is a 71 year old male scheduled for PLIF L-2 revision with extension T10-L2 on 01/27/17 by Dr. Saintclair Halsted. OR room is booked from 7:30 AM-11:40 AM  History includes former smoker, CAD s/p CABG X 1 (LIMA-LAD; [facility]) 11/12/94, asthma, anxiety, depression, "borderline" diabetic, arthritis, fibromyalgia, chronic back pain, insomnia, cholecystectomy, left TKA, decompressive L3-4 laminectomy 10/21/10, right L3-4 laminectomy 09/26/13, L3-5 PLIF 12/14/13, PLIF L1-2 and removal L3-5 hardware 09/12/15, L1-2 hardware removal 03/26/16. BMI is significant for morbid obesity.   PCP is Dr. Thompson Grayer in [place] [phone]).He is primarily followed by Dr. Kenton Kingfisher since his cardiologist retired. He was referred to cardiologist Dr. Delanna Notice [phone]) per anesthesia request prior to his 09/12/15 lumbar surgery and had a normal stress test.   Meds include Xanax, ASA 325mg , Flexeril, lisinopril-HCTZ, methadone, oxycodone, Nitro, potassium gluconate, Zocor, trazodone.  BP (!) 145/80   Pulse 80   Temp 36.9 C   Resp 20   Ht 5' 8.5" (1.74 m)   Wt 296 lb (134.3 kg)   SpO2 96%   BMI 44.35 kg/m   EKG 01/20/17: NSR with sinus arrhythmia, possible LAE. No significant change since last tracing 09/04/15. Patient denied any chest complaints at PAT.   Nuclear stress test 09/09/15 [facility]; scanned under Media tab, Correspondence 09/12/15): Normal Lexiscan myocardial perfusion imaging study with no evidence of ischemia or prior infarction, EF 63%, no wall motion abnormalities.   Last cardiac cath done prior to his CABG '96.  Preoperative labs noted. CBC and BMET WNL.   PAT RN did not document whether or note patient was instructed to hold ASA or not for surgery. Dr. Windy Carina office is closed. I also attempted to contact patient X 2, but no answer other than voice message. I did discuss above with anesthesiologist Dr. Linna Caprice. Since patient had a stress test within the past two year, has  tolerated two lumbar surgeries since, and had a stable EKG then it is anticipated that he can proceed as planned if no acute changes or new CV symptoms.   I am out of the office on Monday 01/24/17, but I will ask staff to follow-up with patient regarding when/if he was given instructions regarding perioperative ASA and get an update on his CV status (verify last cardiology visit, ensure no new symptoms.)  George Hugh [facility] Short Stay Center/Anesthesiology Phone [phone] 01/21/2017 7:02 PM  Addendum:   I spoke with pt by telephone.  His last dose of ASA was 01/22/17. He has not seen a cardiologist since his evaluation by Dr. Rosalita Chessman in 2016.  He denies any CV symptoms.   If no changes, I anticipate pt can proceed with surgery as scheduled.   Willeen Cass, FNP-BC [facility] Short Stay Surgical Center/Anesthesiology Phone: [phone] 01/24/2017 4:58 PM

## 2017-01-27 MED ORDER — VANCOMYCIN HCL 10 G IV SOLR
1500.0000 mg | INTRAVENOUS | Status: AC
  Administered 2017-01-28: 1500 mg via INTRAVENOUS
  Filled 2017-01-27: qty 1500

## 2017-01-27 MED ORDER — DEXAMETHASONE SODIUM PHOSPHATE 10 MG/ML IJ SOLN
10.0000 mg | INTRAMUSCULAR | Status: AC
  Administered 2017-01-28: 10 mg via INTRAVENOUS
  Filled 2017-01-27: qty 1

## 2017-01-28 ENCOUNTER — Inpatient Hospital Stay (HOSPITAL_COMMUNITY)
Admission: RE | Admit: 2017-01-28 | Discharge: 2017-02-01 | DRG: 455 | Disposition: A | Discharge status: Home Health | Payer: Medicare Other | Source: Ambulatory Visit | Attending: Neurosurgery | Admitting: Neurosurgery

## 2017-01-28 ENCOUNTER — Inpatient Hospital Stay (HOSPITAL_COMMUNITY): Payer: Medicare Other | Admitting: Anesthesiology

## 2017-01-28 ENCOUNTER — Inpatient Hospital Stay (HOSPITAL_COMMUNITY): Payer: Medicare Other

## 2017-01-28 ENCOUNTER — Inpatient Hospital Stay (HOSPITAL_COMMUNITY): Payer: Medicare Other | Admitting: Emergency Medicine

## 2017-01-28 ENCOUNTER — Encounter (HOSPITAL_COMMUNITY): Payer: Self-pay | Admitting: *Deleted

## 2017-01-28 ENCOUNTER — Inpatient Hospital Stay (HOSPITAL_COMMUNITY)
Admission: RE | Disposition: A | Discharge status: Home Health | Payer: Self-pay | Source: Ambulatory Visit | Attending: Neurosurgery

## 2017-01-28 DIAGNOSIS — Z7982 Long term (current) use of aspirin: Secondary | ICD-10-CM

## 2017-01-28 DIAGNOSIS — M797 Fibromyalgia: Secondary | ICD-10-CM | POA: Diagnosis present

## 2017-01-28 DIAGNOSIS — Z951 Presence of aortocoronary bypass graft: Secondary | ICD-10-CM

## 2017-01-28 DIAGNOSIS — E785 Hyperlipidemia, unspecified: Secondary | ICD-10-CM | POA: Diagnosis present

## 2017-01-28 DIAGNOSIS — M5126 Other intervertebral disc displacement, lumbar region: Principal | ICD-10-CM | POA: Diagnosis present

## 2017-01-28 DIAGNOSIS — Z791 Long term (current) use of non-steroidal anti-inflammatories (NSAID): Secondary | ICD-10-CM | POA: Diagnosis not present

## 2017-01-28 DIAGNOSIS — M5136 Other intervertebral disc degeneration, lumbar region: Secondary | ICD-10-CM | POA: Diagnosis not present

## 2017-01-28 DIAGNOSIS — M48061 Spinal stenosis, lumbar region without neurogenic claudication: Secondary | ICD-10-CM | POA: Diagnosis present

## 2017-01-28 DIAGNOSIS — M48062 Spinal stenosis, lumbar region with neurogenic claudication: Secondary | ICD-10-CM

## 2017-01-28 DIAGNOSIS — M960 Pseudarthrosis after fusion or arthrodesis: Secondary | ICD-10-CM | POA: Diagnosis not present

## 2017-01-28 DIAGNOSIS — Z87442 Personal history of urinary calculi: Secondary | ICD-10-CM | POA: Diagnosis not present

## 2017-01-28 DIAGNOSIS — Z96652 Presence of left artificial knee joint: Secondary | ICD-10-CM | POA: Diagnosis present

## 2017-01-28 DIAGNOSIS — Z981 Arthrodesis status: Secondary | ICD-10-CM | POA: Diagnosis not present

## 2017-01-28 DIAGNOSIS — Z79899 Other long term (current) drug therapy: Secondary | ICD-10-CM | POA: Diagnosis not present

## 2017-01-28 DIAGNOSIS — Z880 Allergy status to penicillin: Secondary | ICD-10-CM

## 2017-01-28 DIAGNOSIS — I10 Essential (primary) hypertension: Secondary | ICD-10-CM | POA: Diagnosis present

## 2017-01-28 DIAGNOSIS — I2510 Atherosclerotic heart disease of native coronary artery without angina pectoris: Secondary | ICD-10-CM | POA: Diagnosis present

## 2017-01-28 DIAGNOSIS — Z87891 Personal history of nicotine dependence: Secondary | ICD-10-CM | POA: Diagnosis not present

## 2017-01-28 DIAGNOSIS — M532X6 Spinal instabilities, lumbar region: Secondary | ICD-10-CM | POA: Diagnosis present

## 2017-01-28 DIAGNOSIS — F419 Anxiety disorder, unspecified: Secondary | ICD-10-CM | POA: Diagnosis present

## 2017-01-28 DIAGNOSIS — Z419 Encounter for procedure for purposes other than remedying health state, unspecified: Secondary | ICD-10-CM

## 2017-01-28 SURGERY — POSTERIOR LUMBAR FUSION 1 LEVEL
Anesthesia: General | Site: Spine Lumbar

## 2017-01-28 MED ORDER — 0.9 % SODIUM CHLORIDE (POUR BTL) OPTIME
TOPICAL | Status: DC | PRN
  Administered 2017-01-28: 1000 mL

## 2017-01-28 MED ORDER — OXYCODONE HCL 5 MG PO TABS: 5.0000 mg | ORAL_TABLET | ORAL | Status: DC | PRN

## 2017-01-28 MED ORDER — ALUM & MAG HYDROXIDE-SIMETH 200-200-20 MG/5ML PO SUSP
30.0000 mL | Freq: Four times a day (QID) | ORAL | Status: DC | PRN
  Administered 2017-01-29: 30 mL via ORAL
  Filled 2017-01-28: qty 30

## 2017-01-28 MED ORDER — FENTANYL CITRATE (PF) 100 MCG/2ML IJ SOLN
INTRAMUSCULAR | Status: AC
  Filled 2017-01-28: qty 2

## 2017-01-28 MED ORDER — TRAZODONE HCL 100 MG PO TABS
250.0000 mg | ORAL_TABLET | Freq: Every day | ORAL | Status: DC
  Administered 2017-01-28 – 2017-01-31 (×4): 250 mg via ORAL
  Filled 2017-01-28 (×4): qty 2

## 2017-01-28 MED ORDER — SUGAMMADEX SODIUM 500 MG/5ML IV SOLN
INTRAVENOUS | Status: DC | PRN
  Administered 2017-01-28: 500 mg via INTRAVENOUS

## 2017-01-28 MED ORDER — SUGAMMADEX SODIUM 500 MG/5ML IV SOLN
INTRAVENOUS | Status: AC
  Filled 2017-01-28: qty 5

## 2017-01-28 MED ORDER — SODIUM CHLORIDE 0.9% FLUSH
3.0000 mL | INTRAVENOUS | Status: DC | PRN
  Administered 2017-01-29 – 2017-01-30 (×2): 3 mL via INTRAVENOUS
  Filled 2017-01-28 (×2): qty 3

## 2017-01-28 MED ORDER — SUCCINYLCHOLINE CHLORIDE 20 MG/ML IJ SOLN
INTRAMUSCULAR | Status: DC | PRN
  Administered 2017-01-28: 140 mg via INTRAVENOUS

## 2017-01-28 MED ORDER — LISINOPRIL-HYDROCHLOROTHIAZIDE 20-25 MG PO TABS: 1.0000 | ORAL_TABLET | Freq: Every morning | ORAL | Status: DC

## 2017-01-28 MED ORDER — OXYCODONE HCL 5 MG PO TABS
ORAL_TABLET | ORAL | Status: AC
  Filled 2017-01-28: qty 1

## 2017-01-28 MED ORDER — LIDOCAINE HCL (CARDIAC) 20 MG/ML IV SOLN
INTRAVENOUS | Status: DC | PRN
  Administered 2017-01-28: 60 mg via INTRAVENOUS

## 2017-01-28 MED ORDER — SODIUM CHLORIDE 0.9 % IV SOLN
250.0000 mL | INTRAVENOUS | Status: DC
  Administered 2017-01-28: 250 mL via INTRAVENOUS

## 2017-01-28 MED ORDER — SODIUM CHLORIDE 0.9 % IR SOLN
Status: DC | PRN
  Administered 2017-01-28: 500 mL

## 2017-01-28 MED ORDER — PHENYLEPHRINE 40 MCG/ML (10ML) SYRINGE FOR IV PUSH (FOR BLOOD PRESSURE SUPPORT): PREFILLED_SYRINGE | INTRAVENOUS | Status: DC | PRN

## 2017-01-28 MED ORDER — MORPHINE SULFATE (PF) 2 MG/ML IV SOLN
2.0000 mg | INTRAVENOUS | Status: AC
  Administered 2017-01-28: 2 mg via INTRAVENOUS
  Filled 2017-01-28: qty 1

## 2017-01-28 MED ORDER — EPHEDRINE SULFATE 50 MG/ML IJ SOLN
INTRAMUSCULAR | Status: DC | PRN
  Administered 2017-01-28: 10 mg via INTRAVENOUS

## 2017-01-28 MED ORDER — METHOCARBAMOL 1000 MG/10ML IJ SOLN
1000.0000 mg | Freq: Four times a day (QID) | INTRAVENOUS | Status: DC
  Filled 2017-01-28 (×2): qty 10

## 2017-01-28 MED ORDER — ROCURONIUM BROMIDE 50 MG/5ML IV SOSY
PREFILLED_SYRINGE | INTRAVENOUS | Status: AC
  Filled 2017-01-28: qty 10

## 2017-01-28 MED ORDER — FENTANYL CITRATE (PF) 250 MCG/5ML IJ SOLN
INTRAMUSCULAR | Status: AC
  Filled 2017-01-28: qty 10

## 2017-01-28 MED ORDER — SUCCINYLCHOLINE CHLORIDE 200 MG/10ML IV SOSY
PREFILLED_SYRINGE | INTRAVENOUS | Status: AC
  Filled 2017-01-28: qty 10

## 2017-01-28 MED ORDER — PROPOFOL 10 MG/ML IV BOLUS
INTRAVENOUS | Status: DC | PRN
  Administered 2017-01-28 (×2): 50 mg via INTRAVENOUS
  Administered 2017-01-28: 150 mg via INTRAVENOUS

## 2017-01-28 MED ORDER — OXYCODONE HCL 5 MG PO TABS: 10.0000 mg | ORAL_TABLET | Freq: Three times a day (TID) | ORAL | Status: DC | PRN

## 2017-01-28 MED ORDER — METHOCARBAMOL 1000 MG/10ML IJ SOLN: 500.0000 mg | Freq: Once | INTRAVENOUS | Status: DC

## 2017-01-28 MED ORDER — THROMBIN 20000 UNITS EX SOLR
CUTANEOUS | Status: AC
  Filled 2017-01-28: qty 20000

## 2017-01-28 MED ORDER — ACETAMINOPHEN 325 MG PO TABS: 650.0000 mg | ORAL_TABLET | ORAL | Status: DC | PRN

## 2017-01-28 MED ORDER — PHENYLEPHRINE HCL 10 MG/ML IJ SOLN
INTRAMUSCULAR | Status: DC | PRN
  Administered 2017-01-28: 80 ug/min via INTRAVENOUS
  Administered 2017-01-28: 40 ug/min via INTRAVENOUS

## 2017-01-28 MED ORDER — ACETAMINOPHEN 500 MG PO TABS: 500.0000 mg | ORAL_TABLET | Freq: Every day | ORAL | Status: DC | PRN

## 2017-01-28 MED ORDER — ROCURONIUM BROMIDE 50 MG/5ML IV SOSY
PREFILLED_SYRINGE | INTRAVENOUS | Status: AC
  Filled 2017-01-28: qty 5

## 2017-01-28 MED ORDER — PHENYLEPHRINE 40 MCG/ML (10ML) SYRINGE FOR IV PUSH (FOR BLOOD PRESSURE SUPPORT)
PREFILLED_SYRINGE | INTRAVENOUS | Status: DC | PRN
  Administered 2017-01-28 (×4): 80 ug via INTRAVENOUS
  Administered 2017-01-28: 40 ug via INTRAVENOUS
  Administered 2017-01-28: 80 ug via INTRAVENOUS
  Administered 2017-01-28: 40 ug via INTRAVENOUS
  Administered 2017-01-28: 80 ug via INTRAVENOUS

## 2017-01-28 MED ORDER — NITROGLYCERIN 0.4 MG SL SUBL: 0.4000 mg | SUBLINGUAL_TABLET | SUBLINGUAL | Status: DC | PRN

## 2017-01-28 MED ORDER — CYCLOBENZAPRINE HCL 10 MG PO TABS
10.0000 mg | ORAL_TABLET | Freq: Three times a day (TID) | ORAL | Status: DC | PRN
  Administered 2017-01-28 – 2017-01-31 (×5): 10 mg via ORAL
  Filled 2017-01-28 (×5): qty 1

## 2017-01-28 MED ORDER — EPHEDRINE 5 MG/ML INJ
INTRAVENOUS | Status: AC
  Filled 2017-01-28: qty 10

## 2017-01-28 MED ORDER — MIDAZOLAM HCL 2 MG/2ML IJ SOLN
INTRAMUSCULAR | Status: AC
  Filled 2017-01-28: qty 4

## 2017-01-28 MED ORDER — LIDOCAINE-EPINEPHRINE (PF) 2 %-1:200000 IJ SOLN
INTRAMUSCULAR | Status: AC
  Filled 2017-01-28: qty 20

## 2017-01-28 MED ORDER — ASPIRIN EC 325 MG PO TBEC
325.0000 mg | DELAYED_RELEASE_TABLET | Freq: Every day | ORAL | Status: DC
  Administered 2017-01-29 – 2017-02-01 (×4): 325 mg via ORAL
  Filled 2017-01-28 (×4): qty 1

## 2017-01-28 MED ORDER — MORPHINE SULFATE (PF) 4 MG/ML IV SOLN
4.0000 mg | INTRAVENOUS | Status: DC | PRN
  Administered 2017-01-28 – 2017-01-31 (×8): 4 mg via INTRAVENOUS
  Filled 2017-01-28 (×8): qty 1

## 2017-01-28 MED ORDER — ONDANSETRON HCL 4 MG/2ML IJ SOLN: 4.0000 mg | Freq: Once | INTRAMUSCULAR | Status: DC | PRN

## 2017-01-28 MED ORDER — LIDOCAINE-EPINEPHRINE (PF) 2 %-1:200000 IJ SOLN
INTRAMUSCULAR | Status: DC | PRN
  Administered 2017-01-28: 10 mL

## 2017-01-28 MED ORDER — ONDANSETRON HCL 4 MG/2ML IJ SOLN
INTRAMUSCULAR | Status: AC
  Filled 2017-01-28: qty 2

## 2017-01-28 MED ORDER — LACTATED RINGERS IV SOLN
INTRAVENOUS | Status: DC | PRN
  Administered 2017-01-28 (×3): via INTRAVENOUS

## 2017-01-28 MED ORDER — ACETAMINOPHEN 650 MG RE SUPP: 650.0000 mg | RECTAL | Status: DC | PRN

## 2017-01-28 MED ORDER — PANTOPRAZOLE SODIUM 40 MG PO TBEC
40.0000 mg | DELAYED_RELEASE_TABLET | Freq: Every day | ORAL | Status: DC
  Administered 2017-01-28 – 2017-01-31 (×4): 40 mg via ORAL
  Filled 2017-01-28 (×4): qty 1

## 2017-01-28 MED ORDER — HYDROCHLOROTHIAZIDE 25 MG PO TABS
25.0000 mg | ORAL_TABLET | Freq: Every day | ORAL | Status: DC
  Administered 2017-01-29 – 2017-02-01 (×4): 25 mg via ORAL
  Filled 2017-01-28 (×4): qty 1

## 2017-01-28 MED ORDER — ROCURONIUM BROMIDE 100 MG/10ML IV SOLN
INTRAVENOUS | Status: DC | PRN
  Administered 2017-01-28: 20 mg via INTRAVENOUS
  Administered 2017-01-28: 50 mg via INTRAVENOUS
  Administered 2017-01-28 (×2): 20 mg via INTRAVENOUS
  Administered 2017-01-28: 10 mg via INTRAVENOUS

## 2017-01-28 MED ORDER — FENTANYL CITRATE (PF) 100 MCG/2ML IJ SOLN
INTRAMUSCULAR | Status: DC | PRN
  Administered 2017-01-28: 150 ug via INTRAVENOUS
  Administered 2017-01-28 (×7): 50 ug via INTRAVENOUS

## 2017-01-28 MED ORDER — ONDANSETRON HCL 4 MG PO TABS
4.0000 mg | ORAL_TABLET | Freq: Four times a day (QID) | ORAL | Status: DC | PRN
  Administered 2017-01-30 – 2017-01-31 (×2): 4 mg via ORAL
  Filled 2017-01-28 (×2): qty 1

## 2017-01-28 MED ORDER — DEXAMETHASONE SODIUM PHOSPHATE 10 MG/ML IJ SOLN
10.0000 mg | INTRAMUSCULAR | Status: AC
  Administered 2017-01-28: 10 mg via INTRAVENOUS
  Filled 2017-01-28: qty 1

## 2017-01-28 MED ORDER — METHADONE HCL 10 MG PO TABS: 10.0000 mg | ORAL_TABLET | Freq: Three times a day (TID) | ORAL | Status: DC | PRN

## 2017-01-28 MED ORDER — THROMBIN 20000 UNITS EX SOLR
CUTANEOUS | Status: DC | PRN
  Administered 2017-01-28 (×2): 20 mL via TOPICAL

## 2017-01-28 MED ORDER — PHENOL 1.4 % MT LIQD: 1.0000 | OROMUCOSAL | Status: DC | PRN

## 2017-01-28 MED ORDER — ONDANSETRON HCL 4 MG/2ML IJ SOLN
INTRAMUSCULAR | Status: DC | PRN
  Administered 2017-01-28: 4 mg via INTRAVENOUS

## 2017-01-28 MED ORDER — MIDAZOLAM HCL 5 MG/5ML IJ SOLN
INTRAMUSCULAR | Status: DC | PRN
  Administered 2017-01-28 (×2): 1 mg via INTRAVENOUS

## 2017-01-28 MED ORDER — MENTHOL 3 MG MT LOZG: 1.0000 | LOZENGE | OROMUCOSAL | Status: DC | PRN

## 2017-01-28 MED ORDER — NAPROXEN 250 MG PO TABS
500.0000 mg | ORAL_TABLET | Freq: Every day | ORAL | Status: DC
  Administered 2017-01-28: 500 mg via ORAL
  Filled 2017-01-28: qty 2

## 2017-01-28 MED ORDER — VANCOMYCIN HCL 1000 MG IV SOLR
INTRAVENOUS | Status: DC | PRN
  Administered 2017-01-28: 1000 mg

## 2017-01-28 MED ORDER — PROPOFOL 10 MG/ML IV BOLUS
INTRAVENOUS | Status: AC
  Filled 2017-01-28: qty 40

## 2017-01-28 MED ORDER — ALPRAZOLAM 0.5 MG PO TABS
0.5000 mg | ORAL_TABLET | Freq: Two times a day (BID) | ORAL | Status: DC | PRN
  Administered 2017-01-28 – 2017-01-31 (×6): 0.5 mg via ORAL
  Filled 2017-01-28 (×7): qty 1

## 2017-01-28 MED ORDER — POTASSIUM GLUCONATE 595 (99 K) MG PO TABS: 1190.0000 mg | ORAL_TABLET | Freq: Every day | ORAL | Status: DC

## 2017-01-28 MED ORDER — METHOCARBAMOL 1000 MG/10ML IJ SOLN
1000.0000 mg | Freq: Four times a day (QID) | INTRAMUSCULAR | Status: AC
  Administered 2017-01-28 – 2017-01-29 (×4): 1000 mg via INTRAVENOUS
  Filled 2017-01-28 (×4): qty 10

## 2017-01-28 MED ORDER — SODIUM CHLORIDE 0.9% FLUSH
3.0000 mL | Freq: Two times a day (BID) | INTRAVENOUS | Status: DC
  Administered 2017-01-28 – 2017-01-31 (×4): 3 mL via INTRAVENOUS
  Administered 2017-01-31: 10 mL via INTRAVENOUS

## 2017-01-28 MED ORDER — LISINOPRIL 20 MG PO TABS
20.0000 mg | ORAL_TABLET | Freq: Every day | ORAL | Status: DC
  Administered 2017-01-29 – 2017-02-01 (×4): 20 mg via ORAL
  Filled 2017-01-28 (×4): qty 1

## 2017-01-28 MED ORDER — SENNA 8.6 MG PO TABS
2.0000 | ORAL_TABLET | Freq: Every day | ORAL | Status: DC
  Administered 2017-01-28 – 2017-01-31 (×4): 17.2 mg via ORAL
  Filled 2017-01-28 (×4): qty 2

## 2017-01-28 MED ORDER — MIDAZOLAM HCL 2 MG/2ML IJ SOLN
INTRAMUSCULAR | Status: AC
  Filled 2017-01-28: qty 2

## 2017-01-28 MED ORDER — SIMVASTATIN 40 MG PO TABS
40.0000 mg | ORAL_TABLET | Freq: Every day | ORAL | Status: DC
  Administered 2017-01-28 – 2017-01-31 (×4): 40 mg via ORAL
  Filled 2017-01-28 (×4): qty 1

## 2017-01-28 MED ORDER — BUPIVACAINE HCL (PF) 0.25 % IJ SOLN
INTRAMUSCULAR | Status: AC
  Filled 2017-01-28: qty 30

## 2017-01-28 MED ORDER — HYDROMORPHONE HCL 2 MG PO TABS
4.0000 mg | ORAL_TABLET | ORAL | Status: DC | PRN
  Administered 2017-01-28 – 2017-01-31 (×9): 4 mg via ORAL
  Filled 2017-01-28 (×10): qty 2

## 2017-01-28 MED ORDER — METHADONE HCL 10 MG PO TABS
10.0000 mg | ORAL_TABLET | Freq: Four times a day (QID) | ORAL | Status: DC
  Administered 2017-01-28 – 2017-02-01 (×14): 10 mg via ORAL
  Filled 2017-01-28 (×14): qty 1

## 2017-01-28 MED ORDER — ARTIFICIAL TEARS OP OINT
TOPICAL_OINTMENT | OPHTHALMIC | Status: DC | PRN
  Administered 2017-01-28: 1 via OPHTHALMIC

## 2017-01-28 MED ORDER — HYDROMORPHONE HCL 1 MG/ML IJ SOLN
1.0000 mg | INTRAMUSCULAR | Status: DC | PRN
  Administered 2017-01-28: 1 mg via INTRAVENOUS
  Filled 2017-01-28: qty 1

## 2017-01-28 MED ORDER — PHENYLEPHRINE 40 MCG/ML (10ML) SYRINGE FOR IV PUSH (FOR BLOOD PRESSURE SUPPORT)
PREFILLED_SYRINGE | INTRAVENOUS | Status: AC
  Filled 2017-01-28: qty 20

## 2017-01-28 MED ORDER — VANCOMYCIN HCL IN DEXTROSE 1-5 GM/200ML-% IV SOLN
1000.0000 mg | Freq: Two times a day (BID) | INTRAVENOUS | Status: AC
  Administered 2017-01-28 – 2017-01-29 (×2): 1000 mg via INTRAVENOUS
  Filled 2017-01-28 (×2): qty 200

## 2017-01-28 MED ORDER — CYCLOBENZAPRINE HCL 10 MG PO TABS: 10.0000 mg | ORAL_TABLET | Freq: Two times a day (BID) | ORAL | Status: DC | PRN

## 2017-01-28 MED ORDER — VANCOMYCIN HCL 1000 MG IV SOLR
INTRAVENOUS | Status: AC
  Filled 2017-01-28: qty 1000

## 2017-01-28 MED ORDER — METHOCARBAMOL 1000 MG/10ML IJ SOLN: 1000.0000 mg | Freq: Four times a day (QID) | INTRAVENOUS | Status: DC

## 2017-01-28 MED ORDER — CHLORHEXIDINE GLUCONATE CLOTH 2 % EX PADS: 6.0000 | MEDICATED_PAD | Freq: Once | CUTANEOUS | Status: DC

## 2017-01-28 MED ORDER — ALBUMIN HUMAN 5 % IV SOLN
INTRAVENOUS | Status: DC | PRN
  Administered 2017-01-28 (×2): via INTRAVENOUS

## 2017-01-28 MED ORDER — PHENYLEPHRINE 40 MCG/ML (10ML) SYRINGE FOR IV PUSH (FOR BLOOD PRESSURE SUPPORT)
PREFILLED_SYRINGE | INTRAVENOUS | Status: AC
  Filled 2017-01-28: qty 10

## 2017-01-28 MED ORDER — OXYCODONE HCL 5 MG/5ML PO SOLN: 5.0000 mg | Freq: Once | ORAL | Status: AC | PRN

## 2017-01-28 MED ORDER — OXYCODONE HCL 5 MG PO TABS
5.0000 mg | ORAL_TABLET | Freq: Once | ORAL | Status: AC | PRN
  Administered 2017-01-28: 5 mg via ORAL

## 2017-01-28 MED ORDER — THROMBIN 5000 UNITS EX SOLR
CUTANEOUS | Status: AC
  Filled 2017-01-28: qty 5000

## 2017-01-28 MED ORDER — BUPIVACAINE LIPOSOME 1.3 % IJ SUSP
20.0000 mL | INTRAMUSCULAR | Status: AC
  Administered 2017-01-28: 20 mL
  Filled 2017-01-28: qty 20

## 2017-01-28 MED ORDER — ARTIFICIAL TEARS OP OINT
TOPICAL_OINTMENT | OPHTHALMIC | Status: AC
Start: 2017-01-28 — End: 2017-01-28
  Filled 2017-01-28: qty 3.5

## 2017-01-28 MED ORDER — MIDAZOLAM HCL 2 MG/2ML IJ SOLN
1.0000 mg | Freq: Once | INTRAMUSCULAR | Status: AC
  Administered 2017-01-28: 1 mg via INTRAVENOUS

## 2017-01-28 MED ORDER — PANTOPRAZOLE SODIUM 40 MG IV SOLR: 40.0000 mg | Freq: Every day | INTRAVENOUS | Status: DC

## 2017-01-28 MED ORDER — FENTANYL CITRATE (PF) 100 MCG/2ML IJ SOLN
25.0000 ug | INTRAMUSCULAR | Status: DC | PRN
  Administered 2017-01-28 (×3): 50 ug via INTRAVENOUS

## 2017-01-28 MED ORDER — ONDANSETRON HCL 4 MG/2ML IJ SOLN
4.0000 mg | Freq: Four times a day (QID) | INTRAMUSCULAR | Status: DC | PRN
  Administered 2017-01-28: 4 mg via INTRAVENOUS
  Filled 2017-01-28: qty 2

## 2017-01-28 MED ORDER — ADULT MULTIVITAMIN W/MINERALS CH
1.0000 | ORAL_TABLET | Freq: Every day | ORAL | Status: DC
  Administered 2017-01-29 – 2017-02-01 (×4): 1 via ORAL
  Filled 2017-01-28 (×4): qty 1

## 2017-01-28 SURGICAL SUPPLY — 91 items
BAG DECANTER FOR FLEXI CONT (MISCELLANEOUS) ×3 IMPLANT
BENZOIN TINCTURE PRP APPL 2/3 (GAUZE/BANDAGES/DRESSINGS) ×3 IMPLANT
BLADE CLIPPER SURG (BLADE) ×3 IMPLANT
BLADE SURG 11 STRL SS (BLADE) ×3 IMPLANT
BONE VIVIGEN FORMABLE 10CC (Bone Implant) ×3 IMPLANT
BUR CUTTER 7.0 ROUND (BURR) ×3 IMPLANT
BUR MATCHSTICK NEURO 3.0 LAGG (BURR) ×6 IMPLANT
CANISTER SUCT 3000ML PPV (MISCELLANEOUS) ×3 IMPLANT
CAP LOCKING THREADED (Cap) ×18 IMPLANT
CARTRIDGE OIL MAESTRO DRILL (MISCELLANEOUS) ×1 IMPLANT
CLOSURE WOUND 1/2 X4 (GAUZE/BANDAGES/DRESSINGS) ×2
CONNECTOR INLINE 5.5-5.5 (Connector) ×6 IMPLANT
CONT SPEC 4OZ CLIKSEAL STRL BL (MISCELLANEOUS) ×3 IMPLANT
COVER BACK TABLE 60X90IN (DRAPES) ×3 IMPLANT
COVER MAYO STAND STRL (DRAPES) ×3 IMPLANT
DECANTER SPIKE VIAL GLASS SM (MISCELLANEOUS) ×3 IMPLANT
DERMABOND ADVANCED (GAUZE/BANDAGES/DRESSINGS) ×2
DERMABOND ADVANCED .7 DNX12 (GAUZE/BANDAGES/DRESSINGS) ×1 IMPLANT
DIFFUSER DRILL AIR PNEUMATIC (MISCELLANEOUS) ×3 IMPLANT
DRAPE C-ARM 42X72 X-RAY (DRAPES) ×3 IMPLANT
DRAPE C-ARMOR (DRAPES) ×3 IMPLANT
DRAPE HALF SHEET 40X57 (DRAPES) IMPLANT
DRAPE LAPAROTOMY 100X72X124 (DRAPES) ×3 IMPLANT
DRAPE POUCH INSTRU U-SHP 10X18 (DRAPES) ×3 IMPLANT
DRAPE SURG 17X23 STRL (DRAPES) ×3 IMPLANT
DRSG OPSITE 4X5.5 SM (GAUZE/BANDAGES/DRESSINGS) ×3 IMPLANT
DRSG OPSITE POSTOP 4X10 (GAUZE/BANDAGES/DRESSINGS) ×3 IMPLANT
DURAPREP 26ML APPLICATOR (WOUND CARE) ×3 IMPLANT
ELECT BLADE 4.0 EZ CLEAN MEGAD (MISCELLANEOUS) ×3
ELECT REM PT RETURN 9FT ADLT (ELECTROSURGICAL) ×3
ELECTRODE BLDE 4.0 EZ CLN MEGD (MISCELLANEOUS) ×1 IMPLANT
ELECTRODE REM PT RTRN 9FT ADLT (ELECTROSURGICAL) ×1 IMPLANT
EVACUATOR 3/16  PVC DRAIN (DRAIN) ×2
EVACUATOR 3/16 PVC DRAIN (DRAIN) ×1 IMPLANT
GAUZE SPONGE 4X4 12PLY STRL (GAUZE/BANDAGES/DRESSINGS) ×3 IMPLANT
GAUZE SPONGE 4X4 16PLY XRAY LF (GAUZE/BANDAGES/DRESSINGS) ×3 IMPLANT
GLOVE BIO SURGEON STRL SZ7 (GLOVE) IMPLANT
GLOVE BIO SURGEON STRL SZ8 (GLOVE) ×6 IMPLANT
GLOVE BIOGEL PI IND STRL 7.0 (GLOVE) ×1 IMPLANT
GLOVE BIOGEL PI IND STRL 7.5 (GLOVE) ×4 IMPLANT
GLOVE BIOGEL PI IND STRL 8 (GLOVE) ×1 IMPLANT
GLOVE BIOGEL PI INDICATOR 7.0 (GLOVE) ×2
GLOVE BIOGEL PI INDICATOR 7.5 (GLOVE) ×8
GLOVE BIOGEL PI INDICATOR 8 (GLOVE) ×2
GLOVE ECLIPSE 7.5 STRL STRAW (GLOVE) IMPLANT
GLOVE EXAM NITRILE LRG STRL (GLOVE) IMPLANT
GLOVE EXAM NITRILE XL STR (GLOVE) IMPLANT
GLOVE EXAM NITRILE XS STR PU (GLOVE) IMPLANT
GLOVE INDICATOR 8.5 STRL (GLOVE) ×6 IMPLANT
GLOVE SS BIOGEL STRL SZ 7 (GLOVE) ×3 IMPLANT
GLOVE SS BIOGEL STRL SZ 8 (GLOVE) ×2 IMPLANT
GLOVE SUPERSENSE BIOGEL SZ 7 (GLOVE) ×6
GLOVE SUPERSENSE BIOGEL SZ 8 (GLOVE) ×4
GLOVE SURG SS PI 7.0 STRL IVOR (GLOVE) ×15 IMPLANT
GOWN STRL REUS W/ TWL LRG LVL3 (GOWN DISPOSABLE) ×1 IMPLANT
GOWN STRL REUS W/ TWL XL LVL3 (GOWN DISPOSABLE) ×4 IMPLANT
GOWN STRL REUS W/TWL 2XL LVL3 (GOWN DISPOSABLE) IMPLANT
GOWN STRL REUS W/TWL LRG LVL3 (GOWN DISPOSABLE) ×2
GOWN STRL REUS W/TWL XL LVL3 (GOWN DISPOSABLE) ×8
GRAFT BN 10X1XDBM MAGNIFUSE (Bone Implant) ×1 IMPLANT
GRAFT BN 5X1XSPNE CVD POST DBM (Bone Implant) ×1 IMPLANT
GRAFT BONE MAGNIFUSE 1X10CM (Bone Implant) ×2 IMPLANT
GRAFT BONE MAGNIFUSE 1X5CM (Bone Implant) ×2 IMPLANT
KIT BASIN OR (CUSTOM PROCEDURE TRAY) ×3 IMPLANT
KIT INFUSE SMALL (Orthopedic Implant) ×3 IMPLANT
KIT ROOM TURNOVER OR (KITS) ×3 IMPLANT
NEEDLE HYPO 21X1.5 SAFETY (NEEDLE) ×3 IMPLANT
NEEDLE HYPO 25X1 1.5 SAFETY (NEEDLE) ×3 IMPLANT
NS IRRIG 1000ML POUR BTL (IV SOLUTION) ×6 IMPLANT
OIL CARTRIDGE MAESTRO DRILL (MISCELLANEOUS) ×3
PACK LAMINECTOMY NEURO (CUSTOM PROCEDURE TRAY) ×3 IMPLANT
PAD ARMBOARD 7.5X6 YLW CONV (MISCELLANEOUS) ×9 IMPLANT
PATTIES SURGICAL 1X1 (DISPOSABLE) ×3 IMPLANT
ROD 100MM (Rod) ×4 IMPLANT
ROD SPNL STRG 100X5.5XNS (Rod) ×2 IMPLANT
SCREW CREO 5.5X40 (Screw) ×18 IMPLANT
SCREW PA THRD CREO TULIP 5.5X4 (Head) ×21 IMPLANT
SPACER RISE 10X22 8-14MM-10 (Spacer) ×6 IMPLANT
SPONGE LAP 4X18 X RAY DECT (DISPOSABLE) ×3 IMPLANT
SPONGE SURGIFOAM ABS GEL 100 (HEMOSTASIS) ×6 IMPLANT
STRIP CLOSURE SKIN 1/2X4 (GAUZE/BANDAGES/DRESSINGS) ×4 IMPLANT
SUT VIC AB 0 CT1 18XCR BRD8 (SUTURE) ×2 IMPLANT
SUT VIC AB 0 CT1 8-18 (SUTURE) ×4
SUT VIC AB 2-0 CT1 18 (SUTURE) ×6 IMPLANT
SUT VIC AB 4-0 PS2 27 (SUTURE) ×3 IMPLANT
SYR CONTROL 10ML LL (SYRINGE) ×3 IMPLANT
SYRINGE 20CC LL (MISCELLANEOUS) ×3 IMPLANT
TOWEL GREEN STERILE (TOWEL DISPOSABLE) ×3 IMPLANT
TOWEL GREEN STERILE FF (TOWEL DISPOSABLE) ×2 IMPLANT
TRAY FOLEY W/METER SILVER 16FR (SET/KITS/TRAYS/PACK) ×3 IMPLANT
WATER STERILE IRR 1000ML POUR (IV SOLUTION) ×3 IMPLANT

## 2017-01-28 NOTE — Progress Notes (Signed)
Patient standing at edge of bed for several minutes and repositioned on right side in bed with pillows with relief from pain.  Continue to monitor pain and patient mobility progress.

## 2017-01-28 NOTE — Anesthesia Preprocedure Evaluation (Addendum)
Anesthesia Evaluation  Patient identified by MRN, date of birth, ID band Patient awake    Reviewed: Allergy & Precautions, NPO status , Patient's Chart, lab work & pertinent test results  Airway Mallampati: III  TM Distance: >3 FB Neck ROM: Full    Dental  (+) Teeth Intact, Dental Advisory Given   Pulmonary former smoker,    breath sounds clear to auscultation       Cardiovascular hypertension,  Rhythm:Regular Rate:Normal     Neuro/Psych    GI/Hepatic   Endo/Other  diabetes  Renal/GU      Musculoskeletal   Abdominal (+) + obese,   Peds  Hematology   Anesthesia Other Findings   Reproductive/Obstetrics                             Anesthesia Physical Anesthesia Plan  ASA: III  Anesthesia Plan: General   Post-op Pain Management:    Induction: Intravenous  Airway Management Planned: Oral ETT  Additional Equipment:   Intra-op Plan:   Post-operative Plan: Extubation in OR  Informed Consent: I have reviewed the patients History and Physical, chart, labs and discussed the procedure including the risks, benefits and alternatives for the proposed anesthesia with the patient or authorized representative who has indicated his/her understanding and acceptance.   Dental advisory given  Plan Discussed with: CRNA and Anesthesiologist  Anesthesia Plan Comments:        Anesthesia Quick Evaluation

## 2017-01-28 NOTE — Op Note (Signed)
Preoperative diagnosis: Herniated nucleus pulposus lumbar spinal stenosis and instability L1-L2  Postoperative diagnosis: Same  Procedure: #1 redo decompressive laminectomy and posterior lumbar interbody fusion L1-L2 utilizing the globus expandable cages packed with locally harvested autograft mixed withvivgen, BMP  #2 pedicle screw fixation T10-L2 utilizing the globus Creole amp modular pedicle screw set with 5 5 x 40 mm screws at T10, T11, and T12. Tying them into his old construct utilizing the globus addition set with end to end connectors on the residual rod at the end of L2.  #3 posterior lateral arthrodesis T10 L2 utilizing the locally harvested autograft mixed with vivid GEN and BMP and magnafuse  Surgeon: Dominica Severin Copeland Lapier  Assistant: Marland Kitchen ditty  Anesthesia: Gen.  EBL: Minimal  History of present illness: Patient is very pleasant 71 year old some is a long-standing issues with his back is undergone previous lumbar fusions from L2 down to the sacrum develop progress worsening back and bilateral leg pain noted large herniated disc at L1-L2 and due to his progressive breakdown at L1-L2 recommended decompression sterilization procedure but a fusion from T10 tying and is all construct at L2. I extensively went over the risks and benefits of the operation with the patient as well as perioperative course expectations of outcome and alternatives of surgery and he understands and agrees to proceed forward.  Operative procedure: Patient brought in the or was induced under general anesthesia positioned prone the Jackson frame his back was prepped and draped in routine sterile fashion is old incision was opened up and extended cephalad subperiosteal dissections care lamina from T10 down exposing the hardware below the L2 screw after interoperative confirmed the location appropriate level I performed the decompression beginning 12 L1 caring through the entire lamina residual with complete medial  facetectomies at L1-L2 working through the scar tissue freeing up the residual from the previous decompression. After adequate scar tissue been freed up and complete medial facetectomies allowed indication of both the L1 and L2 nerve roots I aggressively under bit the superior articulating facet at L2 to gain access lateral margins disc space a very large free fragment disc was noted on the right removed several large fragments when the disc space cleaned out the disc space radically from both sides prepare the endplates utilizing sequential distraction with a 9 distractor in Place at selected latex minimal cages packed in with a local are autograft mixed inserted and expanded them. This opened up the disc space and opened up the foramina at L1-L2. Then after all the interbody work and [place] by placing both cages as well as local autograft mix centrally attention second pedicle screw placement using AP and lateral fluoroscopy pilot holes were drilled pedicles were cannulated With a 45 tap and 5 5 x 40 screws inserted at T10, T11, and T12 then utilizing and the end connectors I fashion 200 mm rods on and then the connector and connected up into the rod remained above the L2 screw. Prior to assembly the wound scope was irrigated meticulous in space was maintained aggressive decortication was care MTPs or lateral gutters were normal on the lamina of T10-T12 I packed all the BMP autograft mix and magnafuse inner laminar and posterior lateral from T10 down to L2. Then assembled the rod with connectors anchoring everything in place. I then sprinkled vancomycin powder and the wound injected X Burrell in the fascia placed a large Hemovac drain and closed the wound in layers with interrupted Vicryls and a running 4 subcuticular in the skin Dermabond benzo  and Steri-Strips and sterile dressing was applied and patient recovered in stable condition. At the end the case all needle counts and sponge counts were correct.

## 2017-01-28 NOTE — Anesthesia Procedure Notes (Addendum)
Procedure Name: Intubation Date/Time: 01/28/2017 7:42 AM Performed by: Scheryl Darter Pre-anesthesia Checklist: Patient identified, Emergency Drugs available, Suction available and Patient being monitored Patient Re-evaluated:Patient Re-evaluated prior to inductionOxygen Delivery Method: Circle System Utilized Preoxygenation: Pre-oxygenation with 100% oxygen Intubation Type: IV induction Ventilation: Mask ventilation without difficulty Laryngoscope Size: Glidescope and 4 Grade View: Grade IV Tube type: Oral Tube size: 8.0 mm Number of attempts: 1 Airway Equipment and Method: Stylet,  Oral airway,  Video-laryngoscopy and Bougie stylet Placement Confirmation: ETT inserted through vocal cords under direct vision,  positive ETCO2 and breath sounds checked- equal and bilateral Secured at: 23 cm Tube secured with: Tape Dental Injury: Teeth and Oropharynx as per pre-operative assessment  Difficulty Due To: Difficulty was anticipated, Difficult Airway- due to large tongue, Difficult Airway- due to reduced neck mobility, Difficult Airway- due to anterior larynx and Difficult Airway- due to limited oral opening

## 2017-01-28 NOTE — Transfer of Care (Signed)
Immediate Anesthesia Transfer of Care Note  Patient: Michael Barr  Procedure(s) Performed: Procedure(s): Posterior Lumbar Interbody Fusion Lumbar One-Two,Revision with extention Thoracic Ten-Lumbar Two (N/A)  Patient Location: PACU  Anesthesia Type:General  Level of Consciousness: awake, alert , oriented and sedated  Airway & Oxygen Therapy: Patient Spontanous Breathing and Patient connected to face mask oxygen  Post-op Assessment: Report given to RN, Post -op Vital signs reviewed and stable and Patient moving all extremities  Post vital signs: Reviewed and stable  Last Vitals:  Vitals:   01/28/17 0601  BP: 131/63  Pulse: 78  Resp: 20  Temp: 37.5 C    Last Pain: There were no vitals filed for this visit.       Complications: No apparent anesthesia complications

## 2017-01-28 NOTE — H&P (Signed)
Michael Barr is an 71 y.o. male.   Chief Complaint: Back and bilateral leg pain HPI: Patient is a very pleasant 71 year old gentleman is a long-standing back pain and previously undergone an L1 and L5 fusion developed loosening of his L1 screws but no stenosis had L1 screws removed however since then has had progressive breakdown worsening back and leg pain with a new disc herniation at L1-L2. Due to patient's progression of clinical syndrome failure conservative treatment imaging findings I recommended reexploration of fusion discectomy and decompression laminectomy at L1-L2 with extension of his fusion up to T10. I've extensively gone over the risks and benefits of that operation with him as well as perioperative course expectations of outcome and alternatives of surgery and he understands and agrees to proceed forward.  Past Medical History:  Diagnosis Date  . Anxiety    takes Xanax and Methadone daily  . Arthritis    low back- DDD  . Asthma 70's  . Cataract    immature and to the left eye  . Chronic back pain    HNP  . Constipation    takes Sennokot nightly  . Coronary artery disease   . Depression    attacks  . Diabetes mellitus without complication (HCC)    borderline   . Fibromyalgia   . History of kidney stones   . Hyperlipidemia    takes Zocor daily  . Hypertension    takes Zestoretic daily  . Hypokalemia    takes Potassium daily  . Insomnia    takes trazodone nightly  . Joint pain   . Joint swelling   . Nocturia   . Pneumonia 70's  . Weakness    right leg    Past Surgical History:  Procedure Laterality Date  . BACK SURGERY     multiple back surgeries  . CARDIAC CATHETERIZATION     unsure of how long ago  . CARPAL TUNNEL RELEASE Bilateral 70's  . CHOLECYSTECTOMY  1985  . COLONOSCOPY    . CORONARY ARTERY BYPASS GRAFT  1996   1 vessel  . HARDWARE REMOVAL N/A 03/26/2016   Procedure: HARDWARE REMOVAL Lumbar one-two ;  Surgeon: Kary Kos, MD;  Location: [facility];  Service: Neurosurgery;  Laterality: N/A;  . JOINT REPLACEMENT  1999   lt knee  . KNEE ARTHROSCOPY Right   . LUMBAR LAMINECTOMY/DECOMPRESSION MICRODISCECTOMY Right 09/26/2013   Procedure: LUMBAR LAMINECTOMY/DECOMPRESSION MICRODISCECTOMY RIGHT THREE-FOUR;  Surgeon: Elaina Hoops, MD;  Location: [facility];  Service: Neurosurgery;  Laterality: Right;  . pilondial cyst     x 2   . SHOULDER ARTHROSCOPY Bilateral    left x 2 and right x 1  . TONSILLECTOMY    . tumor removed from right ankle   70's    History reviewed. No pertinent family history. Social History:  reports that he has quit smoking. He has never used smokeless tobacco. He reports that he does not drink alcohol or use drugs.  Allergies:  Allergies  Allergen Reactions  . Penicillins Rash    Has patient had a PCN reaction causing immediate rash, facial/tongue/throat swelling, SOB or lightheadedness with hypotension: UNSPECIFIED  Has patient had a PCN reaction causing severe rash involving mucus membranes or skin necrosis: UNSPECIFIED  Has patient had a PCN reaction that required hospitalization UNSPECIFIED  Has patient had a PCN reaction occurring within the last 10 years: UNSPECIFIED  If all of the above answers are "NO", then may proceed with Cephalosporin use.  Medications Prior to Admission  Medication Sig Dispense Refill  . acetaminophen (TYLENOL) 500 MG tablet Take 500 mg by mouth daily as needed for mild pain.     Marland Kitchen ALPRAZolam (XANAX) 0.5 MG tablet Take 0.5 mg by mouth 2 (two) times daily as needed for anxiety.     Marland Kitchen aspirin EC 325 MG tablet Take 325 mg by mouth daily.    . cyclobenzaprine (FLEXERIL) 10 MG tablet Take 1 tablet (10 mg total) by mouth 3 (three) times daily as needed for muscle spasms. (Patient taking differently: Take 10 mg by mouth 2 (two) times daily as needed for muscle spasms. ) 90 tablet 2  . lisinopril-hydrochlorothiazide (PRINZIDE,ZESTORETIC) 20-25 MG per tablet Take 1 tablet by mouth  every morning.     . methadone (DOLOPHINE) 10 MG tablet Take 10 mg by mouth every 8 (eight) hours as needed (for anxiety and pain).     . Multiple Vitamins-Minerals (MULTIVITAMIN WITH MINERALS) tablet Take 1 tablet by mouth daily.    . naproxen (NAPROSYN) 500 MG tablet Take 500 mg by mouth daily.     . nitroGLYCERIN (NITROSTAT) 0.4 MG SL tablet Place 0.4 mg under the tongue every 5 (five) minutes as needed for chest pain.    Marland Kitchen oxyCODONE 10 MG TABS Take 1 tablet (10 mg total) by mouth 3 (three) times daily as needed (severe pain). 30 tablet 0  . potassium gluconate 595 MG TABS tablet Take 1,190 mg by mouth daily.     Marland Kitchen senna (SENOKOT) 8.6 MG TABS tablet Take 2 tablets by mouth at bedtime.    . simvastatin (ZOCOR) 40 MG tablet Take 40 mg by mouth at bedtime.    . traZODone (DESYREL) 100 MG tablet Take 250 mg by mouth at bedtime.      No results found for this or any previous visit (from the past 48 hour(s)). No results found.  Review of Systems  Musculoskeletal: Positive for back pain, joint pain and myalgias.  Neurological: Positive for tingling and sensory change.    Blood pressure 131/63, pulse 78, temperature 99.5 F (37.5 C), resp. rate 20, height 5' 8.5" (1.74 m), weight 134.3 kg (296 lb), SpO2 95 %. Physical Exam  Constitutional: He is oriented to person, place, and time. He appears well-developed.  HENT:  Head: Normocephalic.  Eyes: Pupils are equal, round, and reactive to light.  Neck: Normal range of motion.  GI: Soft.  Neurological: He is alert and oriented to person, place, and time. He has normal strength. GCS eye subscore is 4. GCS verbal subscore is 5. GCS motor subscore is 6.  Strength is 5 out of 5 iliopsoas, quads, hamstrings, gastrocs, and tibialis, EHL.  Skin: Skin is warm and dry.     Assessment/Plan 71 year old gentleman presents for decompression and interbody fusion L1-L2 with extension of his fusion from T10-L2.  [name], MD 01/28/2017, 7:21 AM

## 2017-01-29 NOTE — Anesthesia Postprocedure Evaluation (Signed)
Anesthesia Post Note  Patient: Michael Barr  Procedure(s) Performed: Procedure(s) (LRB): Posterior Lumbar Interbody Fusion Lumbar One-Two,Revision with extention Thoracic Ten-Lumbar Two (N/A)  Patient location during evaluation: PACU Anesthesia Type: General Level of consciousness: awake, awake and alert and oriented Pain management: pain level controlled Vital Signs Assessment: post-procedure vital signs reviewed and stable Respiratory status: spontaneous breathing, nonlabored ventilation and respiratory function stable Cardiovascular status: blood pressure returned to baseline Anesthetic complications: no       Last Vitals:  Vitals:   01/29/17 0118 01/29/17 0506  BP: (!) 146/49 (!) 147/57  Pulse: 97 89  Resp: 20 20  Temp: 37.1 C 37.2 C    Last Pain:  Vitals:   01/29/17 0506  TempSrc: Oral  PainSc:                  [name]

## 2017-01-29 NOTE — Evaluation (Signed)
Physical Therapy Evaluation Patient Details Name: Michael Barr MRN: [id number] DOB: 24-Feb-1946 Today's Date: 01/29/2017   History of Present Illness  71 y.o. male presenting for spinal fusion of L1-L2. Pt with PMH significant for anxiety, asthma, cataract, CAD, depression, HTN, HLD. 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   Clinical Impression  Pt presents s/p lumbar fusion. Pt tolerated OOB mobility/ambulation well, however, is limited by decreased activity tolerance and balance. Recommend d/c home with HHPT for safe transition home. Acute PT will follow.     Follow Up Recommendations Home health PT;Supervision/Assistance - 24 hour    Equipment Recommendations  None recommended by PT    Recommendations for Other Services       Precautions / Restrictions Precautions Precautions: Back;Fall Precaution Booklet Issued: Yes (comment) (Pt with wound vac) Precaution Comments: reviewed precautions with patient who verbalized understanding Required Braces or Orthoses: Spinal Brace Spinal Brace: Applied in sitting position;Lumbar corset Restrictions Weight Bearing Restrictions: No      Mobility  Bed Mobility Overal bed mobility: Needs Assistance Bed Mobility: Sidelying to Sit   Sidelying to sit: Min guard       General bed mobility comments: min guard for safety and to ensure compliance with back precautions. increased time and effort, however, no physical assist required  Transfers Overall transfer level: Needs assistance Equipment used: Rolling walker (2 wheeled) Transfers: Sit to/from Stand Sit to Stand: Min guard         General transfer comment: verbal cues for hand placement and safety with RW. min guard for safety. increased time and effort, however, no physical assist required  Ambulation/Gait Ambulation/Gait assistance: Min guard Ambulation Distance (Feet):  120 Feet Assistive device: Rolling walker (2 wheeled) Gait Pattern/deviations: Step-through pattern;Decreased stride length;Wide base of support Gait velocity: decreased Gait velocity interpretation: Below normal speed for age/gender General Gait Details: verbal cues for upright posture and safety with RW. slow speed. min guard for safety, however, no physical assist required.  Stairs            Wheelchair Mobility    Modified Rankin (Stroke Patients Only)       Balance Overall balance assessment: Needs assistance Sitting-balance support: Feet supported;No upper extremity supported Sitting balance-Leahy Scale: Good     Standing balance support: Bilateral upper extremity supported Standing balance-Leahy Scale: Poor Standing balance comment: reliant on RW                             Pertinent Vitals/Pain Pain Assessment: 0-10 Pain Score: 7  Pain Location: surgical site Pain Descriptors / Indicators: Discomfort Pain Intervention(s): Limited activity within patient's tolerance;Monitored during session;Repositioned    Home Living Family/patient expects to be discharged to:: Private residence Living Arrangements: Spouse/significant other Available Help at Discharge: Family;Available 24 hours/day Type of Home: House Home Access: Stairs to enter Entrance Stairs-Rails: Right Entrance Stairs-Number of Steps: 2 Home Layout: One level Home Equipment: Walker - 4 wheels;Cane - single point;Grab bars - tub/shower Additional Comments: sink beside toilet that he can pull up on. Will have walk-in shower within next month or two with shower seat    Prior Function Level of Independence: Independent with assistive device(s)         Comments: used rollator. was driving and working occassionally     Hand Dominance   Dominant Hand: Right    Extremity/Trunk Assessment  Upper Extremity Assessment Upper Extremity Assessment: Overall WFL for tasks assessed     Lower Extremity Assessment Lower Extremity Assessment: Overall WFL for tasks assessed    Cervical / Trunk Assessment Cervical / Trunk Assessment: Other exceptions Cervical / Trunk Exceptions: s/p spinal surgery  Communication   Communication: No difficulties  Cognition Arousal/Alertness: Awake/alert Behavior During Therapy: WFL for tasks assessed/performed Overall Cognitive Status: History of cognitive impairments - at baseline                                 General Comments: per pt and wife, pt with h/o cognitive impairments (trouble with memory)      General Comments General comments (skin integrity, edema, etc.): Pt assisted into bathroom with minA required during turn for steadying. Pt independent with toilet hygeine    Exercises     Assessment/Plan    PT Assessment Patient needs continued PT services  PT Problem List Decreased activity tolerance;Decreased balance;Decreased mobility;Pain       PT Treatment Interventions DME instruction;Gait training;Stair training;Functional mobility training;Therapeutic activities;Therapeutic exercise;Balance training;Neuromuscular re-education;Patient/family education;Cognitive remediation    PT Goals (Current goals can be found in the Care Plan section)  Acute Rehab PT Goals Patient Stated Goal: go home PT Goal Formulation: With patient Time For Goal Achievement: 02/12/17 Potential to Achieve Goals: Good    Frequency Min 5X/week   Barriers to discharge        Co-evaluation               End of Session Equipment Utilized During Treatment: Gait belt Activity Tolerance: Patient tolerated treatment well Patient left: in chair;with call bell/phone within reach;with nursing/sitter in room;with family/visitor present Nurse Communication: Mobility status PT Visit Diagnosis: Unsteadiness on feet (R26.81);Difficulty in walking, not elsewhere classified (R26.2)    Time: 9767-3419 PT Time Calculation (min)  (ACUTE ONLY): 23 min   Charges:   PT Evaluation $PT Eval Moderate Complexity: 1 Procedure PT Treatments $Gait Training: 8-22 mins   PT G CodesTracie Harrier, SPT Acute Rehab SPT [phone]    Tracie Harrier 01/29/2017, 10:40 AM

## 2017-01-29 NOTE — Evaluation (Signed)
Occupational Therapy Evaluation Patient Details Name: Michael Barr MRN: [id number] DOB: 07-10-1946 Today's Date: 01/29/2017    History of Present Illness 71 y.o. male presenting for spinal fusion of L1-L2. Pt with PMH significant for anxiety, asthma, cataract, CAD, depression, HTN, HLD. 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    Clinical Impression   PTA, pt was independent with ADL and functional mobility. Pt currently requires moderate assistance for LB ADL and min guard assist for toilet transfers. Pt limited by decreased activity tolerance for ADL and pain this session but willing to participate with therapy. Educated on dressing, bathing, and grooming techniques to improve adherence to back precautions during ADL. Pt and wife verbalize understanding. He will benefit from continued OT services while admitted to improve independence with ADL and functional mobility in preparation for D/C home with 24 hour assistance from wife.    Follow Up Recommendations  No OT follow up;Supervision/Assistance - 24 hour    Equipment Recommendations  3 in 1 bedside commode    Recommendations for Other Services       Precautions / Restrictions Precautions Precautions: Back;Fall Precaution Booklet Issued: Yes (comment) (Pt with wound vac) Precaution Comments: reviewed precautions with patient who verbalized understanding Required Braces or Orthoses: Spinal Brace Spinal Brace: Applied in sitting position;Lumbar corset Restrictions Weight Bearing Restrictions: No      Mobility Bed Mobility Overal bed mobility: Needs Assistance Bed Mobility: Sidelying to Sit;Rolling Rolling: Supervision Sidelying to sit: Min guard       General bed mobility comments: Min guard for safety. Pt able to demonstrate good technique.  Transfers Overall transfer level: Needs assistance Equipment used:  Rolling walker (2 wheeled) Transfers: Sit to/from Stand Sit to Stand: Min guard         General transfer comment: Min guard for safety.    Balance Overall balance assessment: Needs assistance Sitting-balance support: Feet supported;No upper extremity supported Sitting balance-Leahy Scale: Good     Standing balance support: Bilateral upper extremity supported;No upper extremity supported;During functional activity Standing balance-Leahy Scale: Poor Standing balance comment: reliant on RW                           ADL either performed or assessed with clinical judgement   ADL Overall ADL's : Needs assistance/impaired Eating/Feeding: Set up;Sitting   Grooming: Set up;Sitting   Upper Body Bathing: Set up;Sitting   Lower Body Bathing: Moderate assistance;Sit to/from stand   Upper Body Dressing : Set up;Sitting   Lower Body Dressing: Moderate assistance;Sit to/from stand   Toilet Transfer: Min guard;Ambulation;RW;Comfort height toilet   Toileting- Clothing Manipulation and Hygiene: Min guard;Sit to/from stand       Functional mobility during ADLs: Min guard;Rolling walker General ADL Comments: Educated pt on dressing/bathing techniques, safe use of DME, use of AE for LB ADL, and 2 cup method for oral care at sink.     Vision Patient Visual Report: No change from baseline Vision Assessment?: No apparent visual deficits     Perception     Praxis      Pertinent Vitals/Pain Pain Assessment: 0-10 Pain Score: 7  Pain Location: surgical site Pain Descriptors / Indicators: Discomfort Pain Intervention(s): Monitored during session;Repositioned;Limited activity within patient's tolerance     Hand Dominance Right   Extremity/Trunk Assessment Upper Extremity Assessment Upper Extremity Assessment: Overall WFL for tasks assessed   Lower Extremity Assessment  Lower Extremity Assessment: Overall WFL for tasks assessed   Cervical / Trunk Assessment Cervical /  Trunk Assessment: Other exceptions Cervical / Trunk Exceptions: s/p spinal surgery   Communication Communication Communication: No difficulties   Cognition Arousal/Alertness: Awake/alert Behavior During Therapy: WFL for tasks assessed/performed Overall Cognitive Status: History of cognitive impairments - at baseline                                 General Comments: per pt and wife, pt with h/o cognitive impairments (trouble with memory)   General Comments       Exercises     Shoulder Instructions      Home Living Family/patient expects to be discharged to:: Private residence Living Arrangements: Spouse/significant other Available Help at Discharge: Family;Available 24 hours/day Type of Home: House Home Access: Stairs to enter [employer] of Steps: 2 Entrance Stairs-Rails: Right Home Layout: One level     Bathroom Shower/Tub: Teacher, early years/pre: Standard     Home Equipment: Environmental consultant - 4 wheels;Cane - single point;Grab bars - tub/shower   Additional Comments: sink beside toilet that he can pull up on. Will have walk-in shower within next month or two with shower seat      Prior Functioning/Environment Level of Independence: Independent with assistive device(s)        Comments: used rollator. was driving and working occassionally        OT Problem List: Decreased activity tolerance;Impaired balance (sitting and/or standing);Decreased safety awareness;Decreased knowledge of use of DME or AE;Decreased knowledge of precautions;Pain      OT Treatment/Interventions: Self-care/ADL training;Therapeutic exercise;Energy conservation;DME and/or AE instruction;Therapeutic activities;Patient/family education;Balance training    OT Goals(Current goals can be found in the care plan section) Acute Rehab OT Goals Patient Stated Goal: go home OT Goal Formulation: With patient/family Time For Goal Achievement: 02/12/17 Potential to Achieve  Goals: Good ADL Goals Pt Will Perform Grooming: with modified independence;standing Pt Will Perform Lower Body Bathing: with caregiver independent in assisting;sit to/from stand;with min guard assist Pt Will Perform Lower Body Dressing: with min guard assist;with caregiver independent in assisting;sit to/from stand Pt Will Transfer to Toilet: with modified independence;ambulating;bedside commode (BSC over toilet) Pt Will Perform Toileting - Clothing Manipulation and hygiene: with modified independence;sit to/from stand Additional ADL Goal #1: Pt will recall and adhere to 3/3 back precautions independently during morning ADL routine.  OT Frequency: Min 2X/week   Barriers to D/C:            Co-evaluation              End of Session Equipment Utilized During Treatment: Rolling walker;Back brace Nurse Communication: Mobility status  Activity Tolerance: Patient tolerated treatment well Patient left: in chair;with call bell/phone within reach;with family/visitor present  OT Visit Diagnosis: Unsteadiness on feet (R26.81);Pain Pain - part of body:  (back)                Time: 2330-0762 OT Time Calculation (min): 26 min Charges:  OT General Charges $OT Visit: 1 Procedure OT Evaluation $OT Eval Moderate Complexity: 1 Procedure OT Treatments $Self Care/Home Management : 8-22 mins G-Codes:     Norman Herrlich, MS OTR/L  Pager: [place] A [place] Shamoon 01/29/2017, 3:06 PM

## 2017-01-29 NOTE — Progress Notes (Signed)
Vitals:   01/28/17 1722 01/28/17 2100 01/29/17 0118 01/29/17 0506  BP: (!) 144/63 (!) 159/56 (!) 146/49 (!) 147/57  Pulse: 89 (!) 108 97 89  Resp: 17 18 20 20   Temp: 98 F (36.7 C) 99.7 F (37.6 C) 98.8 F (37.1 C) 98.9 F (37.2 C)  TempSrc: Oral Oral Oral Oral  SpO2: 100% 95% 99% 98%  Weight:      Height:        Patient resting in bed, has ambulated once in the hallway. Dressing clean and dry. Foley DC'd at 0630, patient has not yet voided, nursing staff to monitor voiding function.  Plan: Continued to progress through postoperative recovery. Encouraged to ambulate several more times today in the hallways.  Hosie Spangle, MD 01/29/2017, 9:06 AM

## 2017-01-29 NOTE — Progress Notes (Signed)
Patient foley d/c'd per order. Patient ambulated in hallway with one assist, rolling walker, and brace approximately 25 yards. Back to bed. Continues to complain of pain, little relief of with pain meds throughout night; little sleep.  Continue to monitor.

## 2017-01-30 NOTE — Progress Notes (Signed)
qPhysical Therapy Treatment Patient Details Name: Michael Barr MRN: [id number] DOB: Nov 18, 1945 Today's Date: 01/30/2017    History of Present Illness 71 y.o. male presenting for spinal fusion of L1-L2. Pt with PMH significant for anxiety, asthma, cataract, CAD, depression, HTN, HLD. 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     PT Comments    Pt progressing toward goals and tolerated stairs today. Pt able to recall back precautions, however, continues to require verbal cues for safety during transfers initially with good recall later in session but no carry over between sessions. Pt and wife educated on safety precautions for reduced risk of falls. Current d/c recommendation remains appropriate for safe transition home. PT will continue to follow.    Follow Up Recommendations  Home health PT;Supervision/Assistance - 24 hour     Equipment Recommendations  None recommended by PT    Recommendations for Other Services       Precautions / Restrictions Precautions Precautions: Back;Fall Precaution Comments: pt and spouse able to recall all precautions Required Braces or Orthoses: Spinal Brace Spinal Brace: Applied in sitting position;Lumbar corset Restrictions Weight Bearing Restrictions: No    Mobility  Bed Mobility               General bed mobility comments: seated in recliner upon PT arrival  Transfers Overall transfer level: Needs assistance Equipment used: Rolling walker (2 wheeled) Transfers: Sit to/from Stand Sit to Stand: Min guard;Supervision         General transfer comment: initially min guard for first stand due to need for verbal cues for hand placement and safety with RW, however, stood from toilet at supervision level. increased time and effort   Ambulation/Gait Ambulation/Gait assistance: Min guard Ambulation Distance (Feet): 100 Feet Assistive  device: Rolling walker (2 wheeled) Gait Pattern/deviations: Step-through pattern;Decreased stride length;Wide base of support Gait velocity: decreased Gait velocity interpretation: Below normal speed for age/gender General Gait Details: verbal cues for safety with RW and upright posture with contraction of abdominal muscles to reduce strain on back. min guard for safety, however, no physical assist required   Stairs Stairs: Yes   Stair Management: One rail Right;Step to pattern;Forwards Number of Stairs: 2 General stair comments: close supervision for safety, however, no physical assist or verbal cues required. pt with increased difficulty on descent due to LE weakness. increased time and effort  Wheelchair Mobility    Modified Rankin (Stroke Patients Only)       Balance Overall balance assessment: Needs assistance Sitting-balance support: Feet supported;No upper extremity supported Sitting balance-Leahy Scale: Good     Standing balance support: Bilateral upper extremity supported;No upper extremity supported;During functional activity;Single extremity supported Standing balance-Leahy Scale: Poor Standing balance comment: reliant on RW, however, able to perform toilet hygenine/ pull up underwear with 1 UE assist and min guard                            Cognition Arousal/Alertness: Lethargic Behavior During Therapy: WFL for tasks assessed/performed Overall Cognitive Status: History of cognitive impairments - at baseline                                 General Comments: pt. dozing during session. reports he has not had full amount of sleep in over 2 days.  wife confirms this is  true and is contributing to some pt. confusion      Exercises      General Comments General comments (skin integrity, edema, etc.): Pt assisted into bathroom with close supervision, however, no physical assist required. Pt able to perform toilet hygeine in standing with 1 UE  assist and min guard for safety.       Pertinent Vitals/Pain Pain Assessment: No/denies pain Pain Score: 6  Pain Location: surgical site Pain Descriptors / Indicators: Discomfort Pain Intervention(s): Limited activity within patient's tolerance;Monitored during session;Premedicated before session    Home Living                      Prior Function            PT Goals (current goals can now be found in the care plan section) Acute Rehab PT Goals Patient Stated Goal: go home Progress towards PT goals: Progressing toward goals    Frequency    Min 5X/week      PT Plan Current plan remains appropriate    Co-evaluation             End of Session Equipment Utilized During Treatment: Gait belt Activity Tolerance: Patient tolerated treatment well Patient left: in chair;with call bell/phone within reach;with family/visitor present Nurse Communication: Mobility status PT Visit Diagnosis: Unsteadiness on feet (R26.81);Difficulty in walking, not elsewhere classified (R26.2)     Time: 1022-1040 PT Time Calculation (min) (ACUTE ONLY): 18 min  Charges:  $Gait Training: 8-22 mins                    G Codes:       Tracie Harrier, SPT Acute Rehab SPT [place] 01/30/2017, 1:23 PM

## 2017-01-30 NOTE — Progress Notes (Signed)
Occupational Therapy Treatment Patient Details Name: Adarius Tigges MRN: [id number] DOB: 09/29/1946 Today's Date: 01/30/2017    History of present illness 71 y.o. male presenting for spinal fusion of L1-L2. Pt with PMH significant for anxiety, asthma, cataract, CAD, depression, HTN, HLD. 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    OT comments  Pt. And spouse seen for final acute OT session to review safe tub transfer technique.  Both state wife will be assisting with all LB ADLs and had no further questions or concerns for OT due to hx. Of multiple back sx. They feel they have well est. techniques for managing all ADLS and functional mobility. Eager for d/c home tomorrow.  OTR/L notified to sign off.    Follow Up Recommendations  No OT follow up;Supervision/Assistance - 24 hour    Equipment Recommendations  3 in 1 bedside commode    Recommendations for Other Services      Precautions / Restrictions Precautions Precautions: Back;Fall Precaution Comments: pt and spouse able to recall all precautions Required Braces or Orthoses: Spinal Brace Spinal Brace: Applied in sitting position;Lumbar corset Restrictions Weight Bearing Restrictions: No       Mobility Bed Mobility               General bed mobility comments: seated in recliner upon PT arrival  Transfers Overall transfer level: Needs assistance Equipment used: Rolling walker (2 wheeled) Transfers: Sit to/from Stand Sit to Stand: Min guard;Supervision         General transfer comment: initially min guard for first stand due to need for verbal cues for hand placement and safety with RW, however, stood from toilet at supervision level. increased time and effort     Balance Overall balance assessment: Needs assistance Sitting-balance support: Feet supported;No upper extremity supported Sitting balance-Leahy  Scale: Good     Standing balance support: Bilateral upper extremity supported;No upper extremity supported;During functional activity;Single extremity supported Standing balance-Leahy Scale: Poor Standing balance comment: reliant on RW, however, able to perform toilet hygenine/ pull up underwear with 1 UE assist and min guard                           ADL either performed or assessed with clinical judgement   ADL Overall ADL's : Needs assistance/impaired               Lower Body Bathing Details (indicate cue type and reason): pt.s wife present for session.  pt. and spouse declined need for pt. attempt or A/E as wife reports she completes all LB ADLS for pt.         Lower Body Dressing Details (indicate cue type and reason): pt.s wife present for session.  pt. and spouse declined need for pt. attempt or A/E as wife reports she completes all LB ADLS for pt.     Toilet Transfer Details (indicate cue type and reason): pt. and spouse declined need for physical review as they report they have been "cleared" to amb. in the room without physical assistance   Toileting - Clothing Manipulation Details (indicate cue type and reason): spouse reports she completes all pericare for pt. Tub/ Shower Transfer: Tub transfer;Ambulation;Shower seat;Rolling walker;Grab Paediatric nurse Details (indicate cue type and reason): provided demonstration of side step over the tub to confirm that this was the way the pt. and spouse completed transfer at home. they  continued to decline need for physical review however wife was able to verbalize without cueing her hand placement to stabalize RW while pt. transferred in/out.     General ADL Comments: pt. and spouse report he has had a least 5 back sx. prior to this one.  declined need for further OT.  wife assists with all LB adls and plans on continuing to do so.  declined need for brace review or amb. to/from b.room.  did review tub transfer with  wife and spouse to ensure they had the right technique for hand placement and sequencing.  both had no further questions and are eager for d/c home tom. will alert otr/l to sign off     Vision       Perception     Praxis      Cognition Arousal/Alertness: Lethargic Behavior During Therapy: WFL for tasks assessed/performed Overall Cognitive Status: History of cognitive impairments - at baseline                                 General Comments: pt. dozing during session. reports he has not had full amount of sleep in over 2 days.  wife confirms this is true and is contributing to some pt. confusion        Exercises     Shoulder Instructions       General Comments Pt assisted into bathroom with close supervision, however, no physical assist required. Pt able to perform toilet hygeine in standing with 1 UE assist and min guard for safety.     Pertinent Vitals/ Pain       Pain Assessment: No/denies pain Pain Score: 6  Pain Location: surgical site Pain Descriptors / Indicators: Discomfort Pain Intervention(s): Limited activity within patient's tolerance;Monitored during session;Premedicated before session  Home Living                                          Prior Functioning/Environment              Frequency  Min 2X/week        Progress Toward Goals  OT Goals(current goals can now be found in the care plan section)  Progress towards OT goals: Goals met/education completed, patient discharged from OT  Acute Rehab OT Goals Patient Stated Goal: go home  Plan Discharge plan remains appropriate    Co-evaluation                 End of Session Equipment Utilized During Treatment: Rolling walker  OT Visit Diagnosis: Unsteadiness on feet (R26.81);Pain   Activity Tolerance Patient tolerated treatment well   Patient Left in chair;with call bell/phone within reach;with family/visitor present   Nurse Communication           Time: 1151-1200 OT Time Calculation (min): 9 min  Charges: OT General Charges $OT Visit: 1 Procedure OT Treatments $Self Care/Home Management : 8-22 mins   Janice Coffin, COTA/L 01/30/2017, 12:11 PM

## 2017-01-30 NOTE — Progress Notes (Signed)
Pt seen and examined.  No issues overnight. Pain continues to improve Up ambulating in halls Issues with urination reportedly resolved Denies new sx or bowel/bladder dysfunction.  EXAM: Temp:  [98.5 F (36.9 C)-99 F (37.2 C)] 99 F (37.2 C) (04/08 0554) Pulse Rate:  [81-95] 87 (04/08 0554) Resp:  [16-20] 18 (04/08 0554) BP: (104-151)/(52-65) 113/65 (04/08 0554) SpO2:  [92 %-99 %] 96 % (04/08 0554) Intake/Output      04/07 0701 - 04/08 0700 04/08 0701 - 04/09 0700   P.O. 840    I.V. (mL/kg)     Other     IV Piggyback     Total Intake(mL/kg) 840 (6.3)    Urine (mL/kg/hr) 350 (0.1)    Drains 390 (0.1)    Blood     Total Output 740     Net +100          Urine Occurrence 5 x     Awake and alert Follows commands throughout MAE well. Strength appropriate Wound c/d/i  Stable Continue current care Encouraged ambulation Likely d/c tomorrow

## 2017-01-31 MED ORDER — DEXAMETHASONE SODIUM PHOSPHATE 10 MG/ML IJ SOLN
10.0000 mg | Freq: Four times a day (QID) | INTRAMUSCULAR | Status: AC
  Administered 2017-01-31 – 2017-02-01 (×2): 10 mg via INTRAVENOUS
  Filled 2017-01-31 (×2): qty 1

## 2017-01-31 MED ORDER — HYDROMORPHONE HCL 2 MG PO TABS
2.0000 mg | ORAL_TABLET | ORAL | Status: DC | PRN
  Administered 2017-01-31 – 2017-02-01 (×5): 2 mg via ORAL
  Filled 2017-01-31 (×5): qty 1

## 2017-01-31 MED FILL — Thrombin For Soln 20000 Unit: CUTANEOUS | Qty: 1 | Status: AC

## 2017-01-31 MED FILL — Sodium Chloride IV Soln 0.9%: INTRAVENOUS | Qty: 1000 | Status: AC

## 2017-01-31 MED FILL — Heparin Sodium (Porcine) Inj 1000 Unit/ML: INTRAMUSCULAR | Qty: 30 | Status: AC

## 2017-01-31 NOTE — Progress Notes (Signed)
Patient went over 12 hrs without being medicated for pain .hence was is excruciating pain at beginning of this shift. Medicated and pain management education reinforced and pt demonstrated understanding.  Pain is being under control. Now.. Ambulation and activity, and  incision care and hand hygiene  Education also reinforced.

## 2017-01-31 NOTE — Progress Notes (Signed)
qPhysical Therapy Treatment Patient Details Name: Michael Barr MRN: [id number] DOB: 04-09-1946 Today's Date: 01/31/2017    History of Present Illness 71 y.o. male presenting for spinal fusion of L1-L2. Pt with PMH significant for anxiety, asthma, cataract, CAD, depression, HTN, HLD. 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     PT Comments    Pt is progressing towards PT goals.  He requires min guard  And cueing for safety and precaution maintenance.  This session, he was reliant on the RW for balance and stated having pain in his shoulder and L leg, aside from pain in his back.  His pain seems to be his biggest limiting factor.  His wife is supportive and involved in his care.  He would benefit from continued rehab services at the acute level and upon D/C a with HHPT.    Follow Up Recommendations  Home health PT;Supervision/Assistance - 24 hour     Equipment Recommendations  None recommended by PT    Recommendations for Other Services       Precautions / Restrictions Precautions Precautions: Back;Fall Precaution Comments: 3/3 precautions recalled at start of session.  VC's for maintenance of precautions required throughout session. Required Braces or Orthoses: Spinal Brace Spinal Brace: Lumbar corset;Applied in sitting position (adjusted in standing) Restrictions Weight Bearing Restrictions: No Other Position/Activity Restrictions: drain at incision site (removed at end of session)    Mobility  Bed Mobility               General bed mobility comments: Seated at EOB at start of session  Transfers Overall transfer level: Needs assistance Equipment used: Rolling walker (2 wheeled) Transfers: Sit to/from Stand Sit to Stand: Min assist         General transfer comment: VC's required for hand placement and precaution maintenance. Min assist required for powe rup  from EOB.  Pt consistently started sit <> stand with hands on walker.  Ambulation/Gait Ambulation/Gait assistance: Min guard Ambulation Distance (Feet): 150 Feet Assistive device: Rolling walker (2 wheeled) Gait Pattern/deviations: Decreased stride length;Decreased weight shift to left;Trunk flexed;Wide base of support;Step-through pattern Gait velocity: decreased Gait velocity interpretation: Below normal speed for age/gender General Gait Details: VC's for safety, RW proximity, upright posture and to relax shoulders.  Pt states his shoulders have been giving him trouble since before surgery and thats why he pushes the RW in front of him.   Pt states he has tried adjusting his walker height at home and nothing seems to help with the shoulder pain.   Stairs         General stair comments: Discussed stair step-to pattern and sequencing.  Pt states he is comfortable with stairs.  Wheelchair Mobility    Modified Rankin (Stroke Patients Only)       Balance Overall balance assessment: Needs assistance Sitting-balance support: Feet supported;No upper extremity supported Sitting balance-Leahy Scale: Good Sitting balance - Comments: Pt able to apply and remove brace in sitting without difficulty.   Standing balance support: Bilateral upper extremity supported Standing balance-Leahy Scale: Poor Standing balance comment: reliant on RW                            Cognition Arousal/Alertness: Awake/alert Behavior During Therapy: WFL for tasks assessed/performed Overall Cognitive Status: History of cognitive impairments - at baseline  Exercises      General Comments General comments (skin integrity, edema, etc.):  Pt states he wants pain meds during ambulation.  RN administered pain med and removed drain and dressing from incision, stating it looks good.       Pertinent Vitals/Pain Pain Assessment: Faces Faces Pain  Scale: Hurts little more Pain Location: surgical site, L anterior thigh Pain Descriptors / Indicators: Operative site guarding;Sore Pain Intervention(s): Limited activity within patient's tolerance;Monitored during session;RN gave pain meds during session;Repositioned    Home Living                      Prior Function            PT Goals (current goals can now be found in the care plan section) Acute Rehab PT Goals Patient Stated Goal: go home PT Goal Formulation: With patient Time For Goal Achievement: 02/07/17 Potential to Achieve Goals: Good Progress towards PT goals: Progressing toward goals    Frequency    Min 5X/week      PT Plan Current plan remains appropriate    Co-evaluation             End of Session Equipment Utilized During Treatment: Gait belt Activity Tolerance: Patient tolerated treatment well Patient left: in chair;with call bell/phone within reach;with family/visitor present (wife present) Nurse Communication: Mobility status;Patient requests pain meds PT Visit Diagnosis: Unsteadiness on feet (R26.81);Difficulty in walking, not elsewhere classified (R26.2)     Time: 6222-9798 PT Time Calculation (min) (ACUTE ONLY): 29 min  Charges:                       G Codes:        Michael Barr SPT   Michael Barr 01/31/2017, 11:36 AM

## 2017-01-31 NOTE — Clinical Social Work Note (Signed)
CSW consulted for SNF placement. PT rec: home health. OT rec: no f/u. Wife to assist with all ADLs at home. RNCM aware and following for d/c needs. CSW signing off as no further SW needs identified. Please reconsult if new SW needs arise.   Oretha Ellis, [place], [place] Work [phone]

## 2017-01-31 NOTE — Progress Notes (Signed)
Subjective: Patient reports Patient is much better with pain management back pain no leg pain  Objective: Vital signs in last 24 hours: Temp:  [97.7 F (36.5 C)-98.7 F (37.1 C)] 98.3 F (36.8 C) (04/09 0640) Pulse Rate:  [84-103] 89 (04/09 0640) Resp:  [18-20] 18 (04/09 0640) BP: (99-141)/(42-67) 99/53 (04/09 0640) SpO2:  [94 %-97 %] 95 % (04/09 0640)  Intake/Output from previous day: 04/08 0701 - 04/09 0700 In: 742 [P.O.:717] Out: 175 [Drains:175] Intake/Output this shift: No intake/output data recorded.  Strength out of 5 wound clean dry and intact drain minimal output  Lab Results: No results for input(s): WBC, HGB, HCT, PLT in the last 72 hours. BMET No results for input(s): NA, K, CL, CO2, GLUCOSE, BUN, CREATININE, CALCIUM in the last 72 hours.  Studies/Results: No results found.  Assessment/Plan: Continue to mobilize physical occupational therapy DC his Hemovac will talus on his medications as some of the may be making him oversedated  LOS: 3 days     Nazia Rhines P 01/31/2017, 7:59 AM

## 2017-02-01 MED ORDER — METHADONE HCL 10 MG PO TABS: 10.0000 mg | ORAL_TABLET | Freq: Two times a day (BID) | ORAL | 0 refills | Status: DC

## 2017-02-01 MED ORDER — HYDROMORPHONE HCL 2 MG PO TABS: 2.0000 mg | ORAL_TABLET | ORAL | 0 refills | Status: DC | PRN

## 2017-02-01 NOTE — Discharge Instructions (Signed)
Spinal Fusion, Care After °These instructions give you information about caring for yourself after your procedure. Your doctor may also give you more specific instructions. Call your doctor if you have any problems or questions after your procedure. °Follow these instructions at home: °Medicines  °· Take over-the-counter and prescription medicines only as told by your doctor. These include any medicines for pain. °· Do not drive for 24 hours if you received a sedative. °· Do not drive or use heavy machinery while taking prescription pain medicine. °· If you were prescribed an antibiotic medicine, take it as told by your doctor. Do not stop taking the antibiotic even if you start to feel better. °Surgical Cut (Incision) Care  °· Follow instructions from your doctor about how to take care of your surgical cut. Make sure you: °¨ Wash your hands with soap and water before you change your bandage (dressing). If you cannot use soap and water, use hand sanitizer. °¨ Change your bandage as told by your doctor. °¨ Leave stitches (sutures), skin glue, or skin tape (adhesive) strips in place. They may need to stay in place for 2 weeks or longer. If tape strips get loose and curl up, you may trim the loose edges. Do not remove tape strips completely unless your doctor says it is okay. °· Keep your surgical cut clean and dry. Do not take baths, swim, or use a hot tub until your doctor says it is okay. °· Check your surgical cut and the area around it every day for: °¨ Redness. °¨ Swelling. °¨ Fluid. °Physical Activity  °· Return to your normal activities as told by your doctor. Ask your doctor what activities are safe for you. Rest and protect your back as much as you can. °· Follow instructions from your doctor about how to move. Use good posture to help your spine heal. °· Do not lift anything that is heavier than 8 lb (3.6 kg) or as told by your doctor until he or she says that it is safe. Do not lift anything over your  head. °· Do not twist or bend at the waist until your doctor says it is okay. °· Avoid pushing or pulling motions. °· Do not sit or lie down in the same position for long periods of time. °· Do not start to exercise until your doctor says it is okay. Ask your doctor what kinds of exercise you can do to make your back stronger. °General instructions  °· If you were given a brace, use it as told by your doctor. °· Wear compression stockings as told by your doctor. °· Do not use tobacco products. These include cigarettes, chewing tobacco, or e-cigarettes. If you need help quitting, ask your doctor. °· Keep all follow-up visits as told by your doctor. This is important. This includes any visits with your physical therapist, if this applies. °Contact a doctor if: °· Your pain gets worse. °· Your medicine does not help your pain. °· Your legs or feet become painful or swollen. °· Your surgical cut is red, swollen, or painful. °· You have fluid, blood, or pus coming from your surgical cut. °· You feel sick to your stomach (nauseous). °· You throw up (vomit). °· Your have weakness or loss of feeling (numbness) in your legs that is new or getting worse. °· You have a fever. °· You have trouble controlling when you pee (urinate) or poop (have a bowel movement). °Get help right away if: °· Your pain is very   bad.  You have chest pain.  You have trouble breathing.  You start to have a cough. These symptoms may be an emergency. Do not wait to see if the symptoms will go away. Get medical help right away. Call your local emergency services (911 in the [place] Do not drive yourself to the hospital. This information is not intended to replace advice given to you by your health care provider. Make sure you discuss any questions you have with your health care provider. Document Released: 02/04/2011 Document Revised: 06/08/2016 Document Reviewed: 03/26/2015 Elsevier Interactive Patient Education  2017 [place]. No  lifting no bending no twisting. May remove the outer dressing in 3-4 days leave the Steri-Strips on and intact. Cover the Steri-Strips with saran wrap for showers only.

## 2017-02-01 NOTE — Progress Notes (Signed)
PT Cancellation Note  Patient Details Name: Michael Barr MRN: [id number] DOB: 1946-08-07   Cancelled Treatment:     RN was completing D/C papers and medication education.  PT offered one more session but pt declined.  Gaetano Net SPT  Gaetano Net 02/01/2017, 2:57 PM

## 2017-02-01 NOTE — Care Management Important Message (Signed)
Important Message  Patient Details  Name: Michael Barr MRN: [id number] Date of Birth: 1945-12-08   Medicare Important Message Given:  Yes    Orbie Pyo 02/01/2017, 12:26 PM

## 2017-02-01 NOTE — Care Management Note (Signed)
Case Management Note  Patient Details  Name: Michael Barr MRN: [id number] Date of Birth: [birth year]  Subjective/Objective:                    Action/Plan: Patient discharging home with [facility] services. CM met with the patient and his wife and provided a list of [place] agencies. They selected [facility]. Santiago Glad with [facility] notified and accepted the referral. Pt states he has all the needed DME at home. Wife to provide transportation home.   Expected Discharge Date:  02/01/17               Expected Discharge Plan:  [place]  In-House Referral:     Discharge planning Services  CM Consult  Post Acute Care Choice:    Choice offered to:  Patient, Spouse  DME Arranged:    DME Agency:     HH Arranged:  PT [place]:  [place]  Status of Service:  Completed, signed off  If discussed at [place] of Stay Meetings, dates discussed:    Additional Comments:  Pollie Friar, RN 02/01/2017, 11:24 AM

## 2017-02-01 NOTE — Discharge Summary (Signed)
Physician Discharge Summary  Patient ID: Michael Barr MRN: [id number] DOB/AGE: 69-Nov-1947 69 y.o.  Admit date: 01/28/2017 Discharge date: 02/01/2017  Admission Diagnoses:Spinal instability herniated nucleus pulposis L1-L2  Discharge Diagnoses: Same Active Problems:   HNP (herniated nucleus pulposus), lumbar   Discharged Condition: good  Hospital Course: Patient is been also underwent decompressive laminectomy and interbody fusion L1-L2 with pedicle screw fixation T10-L2. Postoperatively patient did fairly well had some difficulty with pain management pain control was observed and tailored with his pain medication by postop day 5 was stable for discharge home scheduled follow-up and outpatient physical therapy.  Consults: Significant Diagnostic Studies: Treatments: T10-L2 lumbar fusion with L1-L2 interbody fusion Discharge Exam: Blood pressure 111/62, pulse 73, temperature 98.6 F (37 C), temperature source Oral, resp. rate 20, height 5' 8.5" (1.74 m), weight 134.3 kg (296 lb), SpO2 95 %. Strength out of 5 wound clean dry and intact  Disposition: Home  Discharge Instructions    Face-to-face encounter (required for Medicare/Medicaid patients)    Complete by:  As directed    I [name] certify that this patient is under my care and that I, or a nurse practitioner or physician's assistant working with me, had a face-to-face encounter that meets the physician face-to-face encounter requirements with this patient on 02/01/2017. The encounter with the patient was in whole, or in part for the following medical condition(s) which is the primary reason for home health care (List medical condition): Lumbar spinal stenosis herniated nucleus pulposis   The encounter with the patient was in whole, or in part, for the following medical condition, which is the primary reason for home health care:  Herniated nucleus pulposus lumbar spinal stenosis   I certify that, based on my findings, the  following services are medically necessary home health services:  Physical therapy   Reason for Medically Necessary Home Health Services:  Therapy- Instruction on Safe use of Assistive Devices for ADLs   My clinical findings support the need for the above services:  Pain interferes with ambulation/mobility   Further, I certify that my clinical findings support that this patient is homebound due to:  Pain interferes with ambulation/mobility   Home Health    Complete by:  As directed    To provide the following care/treatments:  PT     Allergies as of 02/01/2017      Reactions   Penicillins Rash   Has patient had a PCN reaction causing immediate rash, facial/tongue/throat swelling, SOB or lightheadedness with hypotension: UNSPECIFIED  Has patient had a PCN reaction causing severe rash involving mucus membranes or skin necrosis: UNSPECIFIED  Has patient had a PCN reaction that required hospitalization UNSPECIFIED  Has patient had a PCN reaction occurring within the last 10 years: UNSPECIFIED  If all of the above answers are "NO", then may proceed with Cephalosporin use.      Medication List    STOP taking these medications   cyclobenzaprine 10 MG tablet Commonly known as:  FLEXERIL   Oxycodone HCl 10 MG Tabs     TAKE these medications   acetaminophen 500 MG tablet Commonly known as:  TYLENOL Take 500 mg by mouth daily as needed for mild pain.   ALPRAZolam 0.5 MG tablet Commonly known as:  XANAX Take 0.5 mg by mouth 2 (two) times daily as needed for anxiety.   aspirin EC 325 MG tablet Take 325 mg by mouth daily.   HYDROmorphone 2 MG tablet Commonly known as:  DILAUDID Take 1 tablet (2 mg  total) by mouth every 3 (three) hours as needed for moderate pain or severe pain.   lisinopril-hydrochlorothiazide 20-25 MG tablet Commonly known as:  PRINZIDE,ZESTORETIC Take 1 tablet by mouth every morning.   methadone 10 MG tablet Commonly known as:  DOLOPHINE Take 1 tablet (10 mg  total) by mouth every 12 (twelve) hours. What changed:  when to take this  reasons to take this   multivitamin with minerals tablet Take 1 tablet by mouth daily.   naproxen 500 MG tablet Commonly known as:  NAPROSYN Take 500 mg by mouth daily.   nitroGLYCERIN 0.4 MG SL tablet Commonly known as:  NITROSTAT Place 0.4 mg under the tongue every 5 (five) minutes as needed for chest pain.   potassium gluconate 595 (99 K) MG Tabs tablet Take 1,190 mg by mouth daily.   senna 8.6 MG Tabs tablet Commonly known as:  SENOKOT Take 2 tablets by mouth at bedtime.   simvastatin 40 MG tablet Commonly known as:  ZOCOR Take 40 mg by mouth at bedtime.   traZODone 100 MG tablet Commonly known as:  DESYREL Take 250 mg by mouth at bedtime.      Follow-up Information    Mackenna Kamer P, MD Follow up.   Specialty:  Neurosurgery Contact information: [address] [place] [address] [phone]           Signed: Elaina Hoops 02/01/2017, 7:52 AM

## 2017-02-01 NOTE — Progress Notes (Signed)
Pt d/c to home by car with family. Assessment stable. Prescriptions given. All questions answered. 

## 2017-02-02 DIAGNOSIS — R7303 Prediabetes: Secondary | ICD-10-CM | POA: Diagnosis not present

## 2017-02-02 DIAGNOSIS — I2510 Atherosclerotic heart disease of native coronary artery without angina pectoris: Secondary | ICD-10-CM | POA: Diagnosis not present

## 2017-02-02 DIAGNOSIS — Z96652 Presence of left artificial knee joint: Secondary | ICD-10-CM | POA: Diagnosis not present

## 2017-02-02 DIAGNOSIS — I10 Essential (primary) hypertension: Secondary | ICD-10-CM | POA: Diagnosis not present

## 2017-02-02 DIAGNOSIS — Z4789 Encounter for other orthopedic aftercare: Secondary | ICD-10-CM | POA: Diagnosis not present

## 2017-02-02 DIAGNOSIS — M545 Low back pain: Secondary | ICD-10-CM | POA: Diagnosis not present

## 2017-02-02 DIAGNOSIS — Z8673 Personal history of transient ischemic attack (TIA), and cerebral infarction without residual deficits: Secondary | ICD-10-CM | POA: Diagnosis not present

## 2017-02-02 DIAGNOSIS — G8929 Other chronic pain: Secondary | ICD-10-CM | POA: Diagnosis not present

## 2017-02-02 DIAGNOSIS — F419 Anxiety disorder, unspecified: Secondary | ICD-10-CM | POA: Diagnosis not present

## 2017-02-02 DIAGNOSIS — M797 Fibromyalgia: Secondary | ICD-10-CM | POA: Diagnosis not present

## 2017-02-02 DIAGNOSIS — Z951 Presence of aortocoronary bypass graft: Secondary | ICD-10-CM | POA: Diagnosis not present

## 2017-02-07 DIAGNOSIS — G8929 Other chronic pain: Secondary | ICD-10-CM | POA: Diagnosis not present

## 2017-02-07 DIAGNOSIS — I2510 Atherosclerotic heart disease of native coronary artery without angina pectoris: Secondary | ICD-10-CM | POA: Diagnosis not present

## 2017-02-07 DIAGNOSIS — Z4789 Encounter for other orthopedic aftercare: Secondary | ICD-10-CM | POA: Diagnosis not present

## 2017-02-07 DIAGNOSIS — M545 Low back pain: Secondary | ICD-10-CM | POA: Diagnosis not present

## 2017-02-07 DIAGNOSIS — I10 Essential (primary) hypertension: Secondary | ICD-10-CM | POA: Diagnosis not present

## 2017-02-07 DIAGNOSIS — R7303 Prediabetes: Secondary | ICD-10-CM | POA: Diagnosis not present

## 2017-02-09 DIAGNOSIS — R7303 Prediabetes: Secondary | ICD-10-CM | POA: Diagnosis not present

## 2017-02-09 DIAGNOSIS — M545 Low back pain: Secondary | ICD-10-CM | POA: Diagnosis not present

## 2017-02-09 DIAGNOSIS — I10 Essential (primary) hypertension: Secondary | ICD-10-CM | POA: Diagnosis not present

## 2017-02-09 DIAGNOSIS — Z4789 Encounter for other orthopedic aftercare: Secondary | ICD-10-CM | POA: Diagnosis not present

## 2017-02-09 DIAGNOSIS — G8929 Other chronic pain: Secondary | ICD-10-CM | POA: Diagnosis not present

## 2017-02-09 DIAGNOSIS — I2510 Atherosclerotic heart disease of native coronary artery without angina pectoris: Secondary | ICD-10-CM | POA: Diagnosis not present

## 2017-02-15 DIAGNOSIS — I10 Essential (primary) hypertension: Secondary | ICD-10-CM | POA: Diagnosis not present

## 2017-02-15 DIAGNOSIS — I2510 Atherosclerotic heart disease of native coronary artery without angina pectoris: Secondary | ICD-10-CM | POA: Diagnosis not present

## 2017-02-15 DIAGNOSIS — M545 Low back pain: Secondary | ICD-10-CM | POA: Diagnosis not present

## 2017-02-15 DIAGNOSIS — R7303 Prediabetes: Secondary | ICD-10-CM | POA: Diagnosis not present

## 2017-02-15 DIAGNOSIS — G8929 Other chronic pain: Secondary | ICD-10-CM | POA: Diagnosis not present

## 2017-02-15 DIAGNOSIS — Z4789 Encounter for other orthopedic aftercare: Secondary | ICD-10-CM | POA: Diagnosis not present

## 2017-02-18 DIAGNOSIS — Z4789 Encounter for other orthopedic aftercare: Secondary | ICD-10-CM | POA: Diagnosis not present

## 2017-02-18 DIAGNOSIS — I2510 Atherosclerotic heart disease of native coronary artery without angina pectoris: Secondary | ICD-10-CM | POA: Diagnosis not present

## 2017-02-18 DIAGNOSIS — M545 Low back pain: Secondary | ICD-10-CM | POA: Diagnosis not present

## 2017-02-18 DIAGNOSIS — I10 Essential (primary) hypertension: Secondary | ICD-10-CM | POA: Diagnosis not present

## 2017-02-18 DIAGNOSIS — R7303 Prediabetes: Secondary | ICD-10-CM | POA: Diagnosis not present

## 2017-02-18 DIAGNOSIS — G8929 Other chronic pain: Secondary | ICD-10-CM | POA: Diagnosis not present

## 2017-02-22 DIAGNOSIS — I10 Essential (primary) hypertension: Secondary | ICD-10-CM | POA: Diagnosis not present

## 2017-02-22 DIAGNOSIS — Z4789 Encounter for other orthopedic aftercare: Secondary | ICD-10-CM | POA: Diagnosis not present

## 2017-02-22 DIAGNOSIS — R7303 Prediabetes: Secondary | ICD-10-CM | POA: Diagnosis not present

## 2017-02-22 DIAGNOSIS — G8929 Other chronic pain: Secondary | ICD-10-CM | POA: Diagnosis not present

## 2017-02-22 DIAGNOSIS — I2510 Atherosclerotic heart disease of native coronary artery without angina pectoris: Secondary | ICD-10-CM | POA: Diagnosis not present

## 2017-02-22 DIAGNOSIS — M545 Low back pain: Secondary | ICD-10-CM | POA: Diagnosis not present

## 2017-02-24 DIAGNOSIS — I10 Essential (primary) hypertension: Secondary | ICD-10-CM | POA: Diagnosis not present

## 2017-02-24 DIAGNOSIS — M545 Low back pain: Secondary | ICD-10-CM | POA: Diagnosis not present

## 2017-02-24 DIAGNOSIS — Z4789 Encounter for other orthopedic aftercare: Secondary | ICD-10-CM | POA: Diagnosis not present

## 2017-02-24 DIAGNOSIS — G8929 Other chronic pain: Secondary | ICD-10-CM | POA: Diagnosis not present

## 2017-02-24 DIAGNOSIS — I2510 Atherosclerotic heart disease of native coronary artery without angina pectoris: Secondary | ICD-10-CM | POA: Diagnosis not present

## 2017-02-24 DIAGNOSIS — R7303 Prediabetes: Secondary | ICD-10-CM | POA: Diagnosis not present

## 2017-02-28 DIAGNOSIS — R7303 Prediabetes: Secondary | ICD-10-CM | POA: Diagnosis not present

## 2017-02-28 DIAGNOSIS — I2510 Atherosclerotic heart disease of native coronary artery without angina pectoris: Secondary | ICD-10-CM | POA: Diagnosis not present

## 2017-02-28 DIAGNOSIS — M545 Low back pain: Secondary | ICD-10-CM | POA: Diagnosis not present

## 2017-02-28 DIAGNOSIS — Z4789 Encounter for other orthopedic aftercare: Secondary | ICD-10-CM | POA: Diagnosis not present

## 2017-02-28 DIAGNOSIS — G8929 Other chronic pain: Secondary | ICD-10-CM | POA: Diagnosis not present

## 2017-02-28 DIAGNOSIS — I10 Essential (primary) hypertension: Secondary | ICD-10-CM | POA: Diagnosis not present

## 2017-03-01 DIAGNOSIS — S32009K Unspecified fracture of unspecified lumbar vertebra, subsequent encounter for fracture with nonunion: Secondary | ICD-10-CM | POA: Diagnosis not present

## 2017-03-03 DIAGNOSIS — I2510 Atherosclerotic heart disease of native coronary artery without angina pectoris: Secondary | ICD-10-CM | POA: Diagnosis not present

## 2017-03-03 DIAGNOSIS — I10 Essential (primary) hypertension: Secondary | ICD-10-CM | POA: Diagnosis not present

## 2017-03-03 DIAGNOSIS — M545 Low back pain: Secondary | ICD-10-CM | POA: Diagnosis not present

## 2017-03-03 DIAGNOSIS — G8929 Other chronic pain: Secondary | ICD-10-CM | POA: Diagnosis not present

## 2017-03-03 DIAGNOSIS — Z4789 Encounter for other orthopedic aftercare: Secondary | ICD-10-CM | POA: Diagnosis not present

## 2017-03-03 DIAGNOSIS — R7303 Prediabetes: Secondary | ICD-10-CM | POA: Diagnosis not present

## 2017-03-07 DIAGNOSIS — R7303 Prediabetes: Secondary | ICD-10-CM | POA: Diagnosis not present

## 2017-03-07 DIAGNOSIS — G8929 Other chronic pain: Secondary | ICD-10-CM | POA: Diagnosis not present

## 2017-03-07 DIAGNOSIS — I10 Essential (primary) hypertension: Secondary | ICD-10-CM | POA: Diagnosis not present

## 2017-03-07 DIAGNOSIS — Z4789 Encounter for other orthopedic aftercare: Secondary | ICD-10-CM | POA: Diagnosis not present

## 2017-03-07 DIAGNOSIS — M545 Low back pain: Secondary | ICD-10-CM | POA: Diagnosis not present

## 2017-03-07 DIAGNOSIS — I2510 Atherosclerotic heart disease of native coronary artery without angina pectoris: Secondary | ICD-10-CM | POA: Diagnosis not present

## 2017-03-11 DIAGNOSIS — Z4789 Encounter for other orthopedic aftercare: Secondary | ICD-10-CM | POA: Diagnosis not present

## 2017-03-11 DIAGNOSIS — M545 Low back pain: Secondary | ICD-10-CM | POA: Diagnosis not present

## 2017-03-11 DIAGNOSIS — R7303 Prediabetes: Secondary | ICD-10-CM | POA: Diagnosis not present

## 2017-03-11 DIAGNOSIS — I10 Essential (primary) hypertension: Secondary | ICD-10-CM | POA: Diagnosis not present

## 2017-03-11 DIAGNOSIS — I2510 Atherosclerotic heart disease of native coronary artery without angina pectoris: Secondary | ICD-10-CM | POA: Diagnosis not present

## 2017-03-11 DIAGNOSIS — G8929 Other chronic pain: Secondary | ICD-10-CM | POA: Diagnosis not present

## 2017-03-15 DIAGNOSIS — M545 Low back pain: Secondary | ICD-10-CM | POA: Diagnosis not present

## 2017-03-15 DIAGNOSIS — I2510 Atherosclerotic heart disease of native coronary artery without angina pectoris: Secondary | ICD-10-CM | POA: Diagnosis not present

## 2017-03-15 DIAGNOSIS — G8929 Other chronic pain: Secondary | ICD-10-CM | POA: Diagnosis not present

## 2017-03-15 DIAGNOSIS — I10 Essential (primary) hypertension: Secondary | ICD-10-CM | POA: Diagnosis not present

## 2017-03-15 DIAGNOSIS — Z4789 Encounter for other orthopedic aftercare: Secondary | ICD-10-CM | POA: Diagnosis not present

## 2017-03-15 DIAGNOSIS — R7303 Prediabetes: Secondary | ICD-10-CM | POA: Diagnosis not present

## 2017-03-17 DIAGNOSIS — I2510 Atherosclerotic heart disease of native coronary artery without angina pectoris: Secondary | ICD-10-CM | POA: Diagnosis not present

## 2017-03-17 DIAGNOSIS — R7303 Prediabetes: Secondary | ICD-10-CM | POA: Diagnosis not present

## 2017-03-17 DIAGNOSIS — M545 Low back pain: Secondary | ICD-10-CM | POA: Diagnosis not present

## 2017-03-17 DIAGNOSIS — I10 Essential (primary) hypertension: Secondary | ICD-10-CM | POA: Diagnosis not present

## 2017-03-17 DIAGNOSIS — Z4789 Encounter for other orthopedic aftercare: Secondary | ICD-10-CM | POA: Diagnosis not present

## 2017-03-17 DIAGNOSIS — G8929 Other chronic pain: Secondary | ICD-10-CM | POA: Diagnosis not present

## 2017-05-02 ENCOUNTER — Encounter: Payer: Self-pay | Admitting: Family Medicine

## 2017-05-02 ENCOUNTER — Ambulatory Visit (INDEPENDENT_AMBULATORY_CARE_PROVIDER_SITE_OTHER): Payer: Medicare Other | Admitting: Family Medicine

## 2017-05-02 VITALS — BP 148/82 | Ht 68.0 in | Wt 283.0 lb

## 2017-05-02 DIAGNOSIS — R7303 Prediabetes: Secondary | ICD-10-CM | POA: Diagnosis not present

## 2017-05-02 DIAGNOSIS — Z125 Encounter for screening for malignant neoplasm of prostate: Secondary | ICD-10-CM | POA: Diagnosis not present

## 2017-05-02 DIAGNOSIS — I2584 Coronary atherosclerosis due to calcified coronary lesion: Secondary | ICD-10-CM | POA: Diagnosis not present

## 2017-05-02 DIAGNOSIS — E785 Hyperlipidemia, unspecified: Secondary | ICD-10-CM | POA: Diagnosis not present

## 2017-05-02 DIAGNOSIS — I10 Essential (primary) hypertension: Secondary | ICD-10-CM | POA: Diagnosis not present

## 2017-05-02 DIAGNOSIS — Z79899 Other long term (current) drug therapy: Secondary | ICD-10-CM

## 2017-05-02 DIAGNOSIS — I2510 Atherosclerotic heart disease of native coronary artery without angina pectoris: Secondary | ICD-10-CM

## 2017-05-02 MED ORDER — SIMVASTATIN 40 MG PO TABS: 40.0000 mg | ORAL_TABLET | Freq: Every day | ORAL | 1 refills | Status: DC

## 2017-05-02 MED ORDER — LISINOPRIL-HYDROCHLOROTHIAZIDE 20-25 MG PO TABS: 1.0000 | ORAL_TABLET | Freq: Every morning | ORAL | 1 refills | Status: DC

## 2017-05-02 NOTE — Progress Notes (Signed)
   Subjective:    Patient ID: Michael Barr, male    DOB: October 13, 1946, 71 y.o.   MRN: [id number]  HPI: Pt is here to establish a new Primary care Dr. Patient arrives office as a new patient. Had bypass surgery back in the 90s. Next  Has chronic low back pain. Goes to a back pain pain can clinic and due for this. Next  Recently had low back surgery. This was his sixth back surgery. Next   No complaints or concerns  Hx of coronary artery surgery, had multi vessel surg  Patient continues to take lipid medication regularly. No obvious side effects from it. Generally does not miss a dose. Prior blood work results are reviewed with patient. Patient continues to work on fat intake in diet  Blood pressure medicine and blood pressure levels reviewed today with patient. Compliant with blood pressure medicine. States does not miss a dose. No obvious side effects. Blood pressure generally good when checked elsewhere. Watching salt intake.                    Hx of elev sugar, borderline in nature  Numbers since then have been fine     Review of Systems No headache, no major weight loss or weight gain, no chest pain no back pain abdominal pain no change in bowel habits complete ROS otherwise negative     Objective:   Physical Exam Alert and oriented, vitals reviewed and stable, NAD ENT-TM's and ext canals WNL bilat via otoscopic exam Soft palate, tonsils and post pharynx WNL via oropharyngeal exam Neck-symmetric, no masses; thyroid nonpalpable and nontender Pulmonary-no tachypnea or accessory muscle use; Clear without wheezes via auscultation Card--no abnrml murmurs, rhythm reg and rate WNL Carotid pulses symmetric, without bruits Obesity present       Assessment & Plan:  Impression 1 hypertension good control discussed maintain same medicine #2 hyperlipidemia status uncertain prior blood work discussed to maintain same pending #3 coronary artery disease discussed  #4 obesity discussed #5 chronic pain goes to a pain specialist for this discussed

## 2017-05-03 ENCOUNTER — Encounter: Payer: Self-pay | Admitting: *Deleted

## 2017-05-03 LAB — LIPID PANEL
CHOLESTEROL TOTAL: 146 mg/dL (ref 100–199)
Chol/HDL Ratio: 3.7 ratio (ref 0.0–5.0)
HDL: 39 mg/dL — AB (ref >39)
LDL CALC: 71 mg/dL (ref 0–99)
Triglycerides: 178 mg/dL — ABNORMAL HIGH (ref 0–149)
VLDL Cholesterol Cal: 36 mg/dL (ref 5–40)

## 2017-05-03 LAB — HEPATIC FUNCTION PANEL
ALK PHOS: 72 IU/L (ref 39–117)
ALT: 19 IU/L (ref 0–44)
AST: 21 IU/L (ref 0–40)
Albumin: 4.4 g/dL (ref 3.6–4.8)
Bilirubin Total: 0.4 mg/dL (ref 0.0–1.2)
Bilirubin, Direct: 0.11 mg/dL (ref 0.00–0.40)
Total Protein: 7.3 g/dL (ref 6.0–8.5)

## 2017-05-03 LAB — PSA: Prostate Specific Ag, Serum: 3.1 ng/mL (ref 0.0–4.0)

## 2017-05-03 LAB — HEMOGLOBIN A1C
ESTIMATED AVERAGE GLUCOSE: 117 mg/dL
HEMOGLOBIN A1C: 5.7 % — AB (ref 4.8–5.6)

## 2017-05-08 ENCOUNTER — Encounter: Payer: Self-pay | Admitting: Family Medicine

## 2017-05-10 DIAGNOSIS — S32009K Unspecified fracture of unspecified lumbar vertebra, subsequent encounter for fracture with nonunion: Secondary | ICD-10-CM | POA: Diagnosis not present

## 2017-05-10 DIAGNOSIS — M48062 Spinal stenosis, lumbar region with neurogenic claudication: Secondary | ICD-10-CM | POA: Diagnosis not present

## 2017-05-30 DIAGNOSIS — G894 Chronic pain syndrome: Secondary | ICD-10-CM | POA: Diagnosis not present

## 2017-05-30 DIAGNOSIS — M797 Fibromyalgia: Secondary | ICD-10-CM | POA: Diagnosis not present

## 2017-05-30 DIAGNOSIS — M5137 Other intervertebral disc degeneration, lumbosacral region: Secondary | ICD-10-CM | POA: Diagnosis not present

## 2017-07-12 DIAGNOSIS — M48062 Spinal stenosis, lumbar region with neurogenic claudication: Secondary | ICD-10-CM | POA: Diagnosis not present

## 2017-08-16 DIAGNOSIS — Z23 Encounter for immunization: Secondary | ICD-10-CM | POA: Diagnosis not present

## 2017-08-29 DIAGNOSIS — M5137 Other intervertebral disc degeneration, lumbosacral region: Secondary | ICD-10-CM | POA: Diagnosis not present

## 2017-08-29 DIAGNOSIS — G894 Chronic pain syndrome: Secondary | ICD-10-CM | POA: Diagnosis not present

## 2017-09-13 DIAGNOSIS — M48062 Spinal stenosis, lumbar region with neurogenic claudication: Secondary | ICD-10-CM | POA: Diagnosis not present

## 2017-10-19 ENCOUNTER — Telehealth: Payer: Self-pay | Admitting: Family Medicine

## 2017-10-19 MED ORDER — SIMVASTATIN 40 MG PO TABS: 40.0000 mg | ORAL_TABLET | Freq: Every day | ORAL | 1 refills | Status: DC

## 2017-10-19 NOTE — Telephone Encounter (Signed)
Done

## 2017-10-19 NOTE — Telephone Encounter (Signed)
Pt is needing a refill on simvastatin (ZOCOR) 40 MG tablet   [facility] [address] [name]

## 2017-11-02 ENCOUNTER — Encounter: Payer: Self-pay | Admitting: Family Medicine

## 2017-11-02 ENCOUNTER — Ambulatory Visit (INDEPENDENT_AMBULATORY_CARE_PROVIDER_SITE_OTHER): Payer: Medicare Other | Admitting: Family Medicine

## 2017-11-02 VITALS — BP 142/84 | Ht 68.0 in | Wt 278.4 lb

## 2017-11-02 DIAGNOSIS — Z0000 Encounter for general adult medical examination without abnormal findings: Secondary | ICD-10-CM | POA: Diagnosis not present

## 2017-11-02 DIAGNOSIS — M797 Fibromyalgia: Secondary | ICD-10-CM | POA: Diagnosis not present

## 2017-11-02 DIAGNOSIS — E785 Hyperlipidemia, unspecified: Secondary | ICD-10-CM | POA: Diagnosis not present

## 2017-11-02 DIAGNOSIS — I10 Essential (primary) hypertension: Secondary | ICD-10-CM

## 2017-11-02 DIAGNOSIS — Z23 Encounter for immunization: Secondary | ICD-10-CM

## 2017-11-02 MED ORDER — SIMVASTATIN 40 MG PO TABS: 40.0000 mg | ORAL_TABLET | Freq: Every day | ORAL | 1 refills | Status: DC

## 2017-11-02 MED ORDER — LISINOPRIL-HYDROCHLOROTHIAZIDE 20-25 MG PO TABS: 1.0000 | ORAL_TABLET | Freq: Every morning | ORAL | 1 refills | Status: DC

## 2017-11-02 NOTE — Patient Instructions (Signed)
plz call Dr Raynelle Jan or his partners and have them look at the mole on the top of your right shoulder

## 2017-11-02 NOTE — Progress Notes (Signed)
Subjective:    Patient ID: Michael Barr, male    DOB: [birth year], 72 y.o.   MRN: [id number]  HPI AWV- Annual Wellness Visit  The patient was seen for their annual wellness visit. The patient's past medical history, surgical history, and family history were reviewed. Pertinent vaccines were reviewed ( tetanus, pneumonia, shingles, flu) The patient's medication list was reviewed and updated.  The height and weight were entered. The patient's current BMI is:42.34  Cognitive screening was completed. Outcome of Mini - Cog: pass  Falls within the past 6 months:none  Current tobacco usage: none (All patients who use tobacco were given written and verbal information on quitting)  Recent listing of emergency department/hospitalizations over the past year were reviewed.  current specialist the patient sees on a regular basis: pain specialist   Medicare annual wellness visit patient questionnaire was reviewed.  A written screening schedule for the patient for the next 5-10 years was given. Appropriate discussion of followup regarding next visit was discussed.   Blood pressure medicine and blood pressure levels reviewed today with patient. Compliant with blood pressure medicine. States does not miss a dose. No obvious side effects. Blood pressure generally good when checked elsewhere. Watching salt intake.   Patient continues to take lipid medication regularly. No obvious side effects from it. Generally does not miss a dose. Prior blood work results are reviewed with patient. Patient continues to work on fat intake in diet  Got off track a bit with iet during the holidays   Pt tries to get out an d walk at the mall, walks with a walker  Gets out side o abit  Hx elev sugars in the past, now much better     One yr ago had colonoscopy    Review of Systems  Constitutional: Negative for activity change, appetite change and fever.  HENT: Negative for congestion and rhinorrhea.    Eyes: Negative for discharge.  Respiratory: Negative for cough and wheezing.   Cardiovascular: Negative for chest pain.  Gastrointestinal: Negative for abdominal pain, blood in stool and vomiting.  Genitourinary: Negative for difficulty urinating and frequency.  Musculoskeletal: Negative for neck pain.  Skin: Negative for rash.  Allergic/Immunologic: Negative for environmental allergies and food allergies.  Neurological: Negative for weakness and headaches.  Psychiatric/Behavioral: Negative for agitation.  All other systems reviewed and are negative.      Objective:   Physical Exam  Constitutional: He appears well-developed and well-nourished.  Obesity present   HENT:  Head: Normocephalic and atraumatic.  Right Ear: External ear normal.  Left Ear: External ear normal.  Nose: Nose normal.  Mouth/Throat: Oropharynx is clear and moist.  Eyes: EOM are normal. Pupils are equal, round, and reactive to light.  Neck: Normal range of motion. Neck supple. No thyromegaly present.  Cardiovascular: Normal rate, regular rhythm and normal heart sounds.  No murmur heard. Pulmonary/Chest: Effort normal and breath sounds normal. No respiratory distress. He has no wheezes.  Abdominal: Soft. Bowel sounds are normal. He exhibits no distension and no mass. There is no tenderness.  Genitourinary: Penis normal.  Musculoskeletal: Normal range of motion. He exhibits no edema.  Lymphadenopathy:    He has no cervical adenopathy.  Neurological: He is alert. He exhibits normal muscle tone.  Skin: Skin is warm and dry. No erythema.  Psychiatric: He has a normal mood and affect. His behavior is normal. Judgment normal.  Vitals reviewed.  Right shoulder concerning hyperpigmented irregular lesion.  Potentially  Assessment & Plan:  Impression 1 wellness exam.  Up-to-date on colonoscopy.  Diet discussed.  Exercise discussed.  Patient aware that he is obese  2.  Hypertension good control discussed  to maintain same medicine  3.  Hyperlipidemia.  Status uncertain discussed appropriate blood work ordered to maintain same medicines pending  4.  Concerning lesion right shoulder.  Discussed.  Patient has dermatologist instructed to call them and have them see patient and evaluate  5.  Fibromyalgia.  Receives pain medications and specialist  6.  Coronary artery disease followed also by specialist  Plan appropriate blood work.  Diet exercise discussed.   of note patient stated he had not had a Prevnar.  This was drawn.  When we got ready to administer the patient in the office that he had forgotten that he received  pneumonia shot this 1 month previous.

## 2017-11-03 LAB — LIPID PANEL
CHOL/HDL RATIO: 3.5 ratio (ref 0.0–5.0)
CHOLESTEROL TOTAL: 152 mg/dL (ref 100–199)
HDL: 43 mg/dL (ref >39)
LDL CALC: 81 mg/dL (ref 0–99)
TRIGLYCERIDES: 139 mg/dL (ref 0–149)
VLDL CHOLESTEROL CAL: 28 mg/dL (ref 5–40)

## 2017-11-03 LAB — HEPATIC FUNCTION PANEL
ALT: 17 IU/L (ref 0–44)
AST: 20 IU/L (ref 0–40)
Albumin: 4.7 g/dL (ref 3.6–4.8)
Alkaline Phosphatase: 59 IU/L (ref 39–117)
BILIRUBIN TOTAL: 0.4 mg/dL (ref 0.0–1.2)
Bilirubin, Direct: 0.13 mg/dL (ref 0.00–0.40)
Total Protein: 7.3 g/dL (ref 6.0–8.5)

## 2017-11-06 ENCOUNTER — Encounter: Payer: Self-pay | Admitting: Family Medicine

## 2017-11-07 ENCOUNTER — Telehealth: Payer: Self-pay | Admitting: Family Medicine

## 2017-11-07 DIAGNOSIS — L819 Disorder of pigmentation, unspecified: Secondary | ICD-10-CM

## 2017-11-07 NOTE — Telephone Encounter (Signed)
Ok lets hyperpig lesion right shoulder

## 2017-11-07 NOTE — Telephone Encounter (Signed)
Pt is requesting a referral to Dr. Payton Emerald office in [place] to have the mole looked at that Dr. Richardson Landry was concerned about. Please advise.

## 2017-11-07 NOTE — Telephone Encounter (Signed)
Referral ordered in Epic. 

## 2017-11-11 ENCOUNTER — Encounter: Payer: Self-pay | Admitting: Family Medicine

## 2017-11-14 DIAGNOSIS — L570 Actinic keratosis: Secondary | ICD-10-CM | POA: Diagnosis not present

## 2017-11-14 DIAGNOSIS — D485 Neoplasm of uncertain behavior of skin: Secondary | ICD-10-CM | POA: Diagnosis not present

## 2017-11-14 DIAGNOSIS — D1800 Hemangioma unspecified site: Secondary | ICD-10-CM | POA: Diagnosis not present

## 2017-11-14 DIAGNOSIS — D2261 Melanocytic nevi of right upper limb, including shoulder: Secondary | ICD-10-CM | POA: Diagnosis not present

## 2017-11-14 DIAGNOSIS — D225 Melanocytic nevi of trunk: Secondary | ICD-10-CM | POA: Diagnosis not present

## 2017-12-05 DIAGNOSIS — M797 Fibromyalgia: Secondary | ICD-10-CM | POA: Diagnosis not present

## 2017-12-05 DIAGNOSIS — M5137 Other intervertebral disc degeneration, lumbosacral region: Secondary | ICD-10-CM | POA: Diagnosis not present

## 2017-12-05 DIAGNOSIS — G894 Chronic pain syndrome: Secondary | ICD-10-CM | POA: Diagnosis not present

## 2017-12-13 DIAGNOSIS — M48062 Spinal stenosis, lumbar region with neurogenic claudication: Secondary | ICD-10-CM | POA: Diagnosis not present

## 2018-01-30 DIAGNOSIS — G2581 Restless legs syndrome: Secondary | ICD-10-CM | POA: Diagnosis not present

## 2018-01-30 DIAGNOSIS — G894 Chronic pain syndrome: Secondary | ICD-10-CM | POA: Diagnosis not present

## 2018-01-30 DIAGNOSIS — M7061 Trochanteric bursitis, right hip: Secondary | ICD-10-CM | POA: Diagnosis not present

## 2018-01-30 DIAGNOSIS — M797 Fibromyalgia: Secondary | ICD-10-CM | POA: Diagnosis not present

## 2018-01-30 DIAGNOSIS — M5137 Other intervertebral disc degeneration, lumbosacral region: Secondary | ICD-10-CM | POA: Diagnosis not present

## 2018-03-23 DIAGNOSIS — M5440 Lumbago with sciatica, unspecified side: Secondary | ICD-10-CM | POA: Diagnosis not present

## 2018-03-23 DIAGNOSIS — M48062 Spinal stenosis, lumbar region with neurogenic claudication: Secondary | ICD-10-CM | POA: Diagnosis not present

## 2018-03-27 ENCOUNTER — Encounter: Payer: Self-pay | Admitting: Family Medicine

## 2018-05-01 DIAGNOSIS — M5137 Other intervertebral disc degeneration, lumbosacral region: Secondary | ICD-10-CM | POA: Diagnosis not present

## 2018-05-01 DIAGNOSIS — G894 Chronic pain syndrome: Secondary | ICD-10-CM | POA: Diagnosis not present

## 2018-05-02 ENCOUNTER — Ambulatory Visit (INDEPENDENT_AMBULATORY_CARE_PROVIDER_SITE_OTHER): Payer: Medicare Other | Admitting: Family Medicine

## 2018-05-02 ENCOUNTER — Encounter: Payer: Self-pay | Admitting: Family Medicine

## 2018-05-02 VITALS — BP 138/68 | Ht 68.0 in | Wt 289.0 lb

## 2018-05-02 DIAGNOSIS — M797 Fibromyalgia: Secondary | ICD-10-CM

## 2018-05-02 DIAGNOSIS — I2584 Coronary atherosclerosis due to calcified coronary lesion: Secondary | ICD-10-CM | POA: Diagnosis not present

## 2018-05-02 DIAGNOSIS — E785 Hyperlipidemia, unspecified: Secondary | ICD-10-CM

## 2018-05-02 DIAGNOSIS — Z125 Encounter for screening for malignant neoplasm of prostate: Secondary | ICD-10-CM

## 2018-05-02 DIAGNOSIS — I10 Essential (primary) hypertension: Secondary | ICD-10-CM

## 2018-05-02 DIAGNOSIS — I2510 Atherosclerotic heart disease of native coronary artery without angina pectoris: Secondary | ICD-10-CM | POA: Diagnosis not present

## 2018-05-02 DIAGNOSIS — Z79899 Other long term (current) drug therapy: Secondary | ICD-10-CM

## 2018-05-02 MED ORDER — LISINOPRIL-HYDROCHLOROTHIAZIDE 20-25 MG PO TABS: 1.0000 | ORAL_TABLET | Freq: Every morning | ORAL | 1 refills | Status: DC

## 2018-05-02 MED ORDER — SIMVASTATIN 40 MG PO TABS: 40.0000 mg | ORAL_TABLET | Freq: Every day | ORAL | 1 refills | Status: DC

## 2018-05-02 NOTE — Progress Notes (Signed)
   Subjective:    Patient ID: Michael Barr, male    DOB: December 18, 1945, 72 y.o.   MRN: [id number]  HPI  Patient is here today to follow up on Chronic health issues. He states he does not eat healthy. He does get some exercise.Sees a specialist for pain at [facility]. Does not see any other Specialist.  Blood pressure medicine and blood pressure levels reviewed today with patient. Compliant with blood pressure medicine. States does not miss a dose. No obvious side effects. Blood pressure generally good when checked elsewhere. Watching salt intake.   Patient continues to take lipid medication regularly. No obvious side effects from it. Generally does not miss a dose. Prior blood work results are reviewed with patient. Patient continues to work on fat intake in diet  Pt having some hip pain, dxed as bursitits, now hurting, getting an xray   bp usually good   Watching diet overall good  Pt did have a birthdyay   Notes ongoing hip pain n   Review of Systems No headache, no major weight loss or weight gain, no chest pain no back pain abdominal pain no change in bowel habits complete ROS otherwise negative     Objective:   Physical Exam    Alert and oriented, vitals reviewed and stable, NAD ENT-TM's and ext canals WNL bilat via otoscopic exam Soft palate, tonsils and post pharynx WNL via oropharyngeal exam Neck-symmetric, no masses; thyroid nonpalpable and nontender Pulmonary-no tachypnea or accessory muscle use; Clear without wheezes via auscultation Card--no abnrml murmurs, rhythm reg and rate WNL Carotid pulses symmetric, without bruits     Assessment & Plan:  Impression1 hypertension.  Good control.  Discussed.  To maintain same meds  2.  Hyperlipidemia.  Current status uncertain discussed diet discussed compliance with medications discussed  3.  Chronic pain substantial discussion held.  Once again yet another pain specialist is moving to a another opportunity, this is  compromising the patient he is working on a replacement hopefully within the [place] system where he goes.  Appropriate blood work medications refilled diet exercise discussed recheck 6 months for wellness plus chronic visit  Greater than 50% of this 25 minute face to face visit was spent in counseling and discussion and coordination of care regarding the above diagnosis/diagnosies

## 2018-05-03 LAB — BASIC METABOLIC PANEL
BUN/Creatinine Ratio: 21 (ref 10–24)
BUN: 21 mg/dL (ref 8–27)
CALCIUM: 9.3 mg/dL (ref 8.6–10.2)
CHLORIDE: 101 mmol/L (ref 96–106)
CO2: 26 mmol/L (ref 20–29)
Creatinine, Ser: 1.01 mg/dL (ref 0.76–1.27)
GFR calc Af Amer: 87 mL/min/{1.73_m2} (ref >59)
GFR calc non Af Amer: 75 mL/min/{1.73_m2} (ref >59)
Glucose: 109 mg/dL — ABNORMAL HIGH (ref 65–99)
POTASSIUM: 5.1 mmol/L (ref 3.5–5.2)
Sodium: 141 mmol/L (ref 134–144)

## 2018-05-03 LAB — HEPATIC FUNCTION PANEL
ALT: 20 IU/L (ref 0–44)
AST: 22 IU/L (ref 0–40)
Albumin: 4.2 g/dL (ref 3.5–4.8)
Alkaline Phosphatase: 55 IU/L (ref 39–117)
Bilirubin Total: 0.3 mg/dL (ref 0.0–1.2)
Bilirubin, Direct: 0.09 mg/dL (ref 0.00–0.40)
Total Protein: 6.9 g/dL (ref 6.0–8.5)

## 2018-05-03 LAB — LIPID PANEL
Chol/HDL Ratio: 3.7 ratio (ref 0.0–5.0)
Cholesterol, Total: 150 mg/dL (ref 100–199)
HDL: 41 mg/dL (ref >39)
LDL CALC: 78 mg/dL (ref 0–99)
TRIGLYCERIDES: 157 mg/dL — AB (ref 0–149)
VLDL CHOLESTEROL CAL: 31 mg/dL (ref 5–40)

## 2018-05-03 LAB — PSA: Prostate Specific Ag, Serum: 3.6 ng/mL (ref 0.0–4.0)

## 2018-05-07 ENCOUNTER — Encounter: Payer: Self-pay | Admitting: Family Medicine

## 2018-05-11 DIAGNOSIS — M1611 Unilateral primary osteoarthritis, right hip: Secondary | ICD-10-CM | POA: Diagnosis not present

## 2018-05-11 DIAGNOSIS — G894 Chronic pain syndrome: Secondary | ICD-10-CM | POA: Diagnosis not present

## 2018-06-05 DIAGNOSIS — M7061 Trochanteric bursitis, right hip: Secondary | ICD-10-CM | POA: Diagnosis not present

## 2018-06-05 DIAGNOSIS — M5137 Other intervertebral disc degeneration, lumbosacral region: Secondary | ICD-10-CM | POA: Diagnosis not present

## 2018-06-05 DIAGNOSIS — G894 Chronic pain syndrome: Secondary | ICD-10-CM | POA: Diagnosis not present

## 2018-06-15 DIAGNOSIS — Z6841 Body Mass Index (BMI) 40.0 and over, adult: Secondary | ICD-10-CM | POA: Diagnosis not present

## 2018-06-15 DIAGNOSIS — M5440 Lumbago with sciatica, unspecified side: Secondary | ICD-10-CM | POA: Diagnosis not present

## 2018-06-15 DIAGNOSIS — I10 Essential (primary) hypertension: Secondary | ICD-10-CM | POA: Diagnosis not present

## 2018-06-16 ENCOUNTER — Other Ambulatory Visit: Payer: Self-pay | Admitting: Neurosurgery

## 2018-06-16 DIAGNOSIS — R5381 Other malaise: Secondary | ICD-10-CM

## 2018-06-16 DIAGNOSIS — M8580 Other specified disorders of bone density and structure, unspecified site: Secondary | ICD-10-CM

## 2018-06-19 ENCOUNTER — Other Ambulatory Visit: Payer: Self-pay | Admitting: Neurosurgery

## 2018-06-19 ENCOUNTER — Other Ambulatory Visit (HOSPITAL_COMMUNITY): Payer: Self-pay | Admitting: Neurosurgery

## 2018-06-19 DIAGNOSIS — M5440 Lumbago with sciatica, unspecified side: Secondary | ICD-10-CM

## 2018-06-19 DIAGNOSIS — M8580 Other specified disorders of bone density and structure, unspecified site: Secondary | ICD-10-CM

## 2018-06-22 ENCOUNTER — Ambulatory Visit (HOSPITAL_COMMUNITY)
Admission: RE | Admit: 2018-06-22 | Discharge: 2018-06-22 | Disposition: A | Discharge status: Home / Self Care | Payer: Medicare Other | Source: Ambulatory Visit | Attending: Neurosurgery | Admitting: Neurosurgery

## 2018-06-22 DIAGNOSIS — M8580 Other specified disorders of bone density and structure, unspecified site: Secondary | ICD-10-CM | POA: Insufficient documentation

## 2018-06-22 DIAGNOSIS — Z13820 Encounter for screening for osteoporosis: Secondary | ICD-10-CM | POA: Diagnosis not present

## 2018-06-22 DIAGNOSIS — M5440 Lumbago with sciatica, unspecified side: Secondary | ICD-10-CM | POA: Diagnosis not present

## 2018-06-23 ENCOUNTER — Ambulatory Visit
Admission: RE | Admit: 2018-06-23 | Discharge: 2018-06-23 | Disposition: A | Discharge status: Home / Self Care | Payer: Medicare Other | Source: Ambulatory Visit | Attending: Neurosurgery | Admitting: Neurosurgery

## 2018-06-23 DIAGNOSIS — M5440 Lumbago with sciatica, unspecified side: Secondary | ICD-10-CM

## 2018-06-23 DIAGNOSIS — M4324 Fusion of spine, thoracic region: Secondary | ICD-10-CM | POA: Diagnosis not present

## 2018-06-23 DIAGNOSIS — R200 Anesthesia of skin: Secondary | ICD-10-CM | POA: Diagnosis not present

## 2018-07-13 ENCOUNTER — Other Ambulatory Visit: Payer: Self-pay | Admitting: Neurosurgery

## 2018-07-13 DIAGNOSIS — S32009K Unspecified fracture of unspecified lumbar vertebra, subsequent encounter for fracture with nonunion: Secondary | ICD-10-CM | POA: Diagnosis not present

## 2018-07-13 DIAGNOSIS — I10 Essential (primary) hypertension: Secondary | ICD-10-CM | POA: Diagnosis not present

## 2018-07-13 DIAGNOSIS — Z6841 Body Mass Index (BMI) 40.0 and over, adult: Secondary | ICD-10-CM | POA: Diagnosis not present

## 2018-07-26 DIAGNOSIS — G894 Chronic pain syndrome: Secondary | ICD-10-CM | POA: Diagnosis not present

## 2018-07-26 DIAGNOSIS — M5137 Other intervertebral disc degeneration, lumbosacral region: Secondary | ICD-10-CM | POA: Diagnosis not present

## 2018-08-10 ENCOUNTER — Other Ambulatory Visit: Payer: Medicare Other

## 2018-08-11 NOTE — Pre-Procedure Instructions (Signed)
Michael Barr  08/11/2018      [id number] [place] Barr, [place] - [address] [place] [address] Phone: [phone] Fax: [phone]  [id number] [place] Barr, [place] [address] [place] [address] Phone: [phone] Fax: [phone]    Your procedure is scheduled on Monday October 28.  Report to [facility] Admitting at 5:30 A.M.  Call this number if you have problems the morning of surgery:  [phone]   Remember:  Do not eat or drink after midnight.    Take these medicines the morning of surgery with A SIP OF WATER:   Acetaminophen (tylenol) if needed Alprazolam (Xanax) Oxycodone if needed  7 days prior to surgery STOP taking any Aleve, Naproxen, Ibuprofen, Motrin, Advil, Goody's, BC's, all herbal medications, fish oil, and all vitamins  Follow your surgeon's instructions on stopping Aspirin. If no instructions were given, please call your surgeon's office.     How to Manage Your Diabetes Before and After Surgery  Why is it important to control my blood sugar before and after surgery? . Improving blood sugar levels before and after surgery helps healing and can limit problems. . A way of improving blood sugar control is eating a healthy diet by: o  Eating less sugar and carbohydrates o  Increasing activity/exercise o  Talking with your doctor about reaching your blood sugar goals . High blood sugars (greater than 180 mg/dL) can raise your risk of infections and slow your recovery, so you will need to focus on controlling your diabetes during the weeks before surgery. . Make sure that the doctor who takes care of your diabetes knows about your planned surgery including the date and location.  How do I manage my blood sugar before surgery? . Check your blood sugar at least 4 times a day, starting 2 days before surgery, to make sure that the level is not too high or  low. o Check your blood sugar the morning of your surgery when you wake up and every 2 hours until you get to the Short Stay unit. . If your blood sugar is less than 70 mg/dL, you will need to treat for low blood sugar: o Do not take insulin. o Treat a low blood sugar (less than 70 mg/dL) with  cup of clear juice (cranberry or apple), 4 glucose tablets, OR glucose gel. Recheck blood sugar in 15 minutes after treatment (to make sure it is greater than 70 mg/dL). If your blood sugar is not greater than 70 mg/dL on recheck, call [phone] o  for further instructions. . Report your blood sugar to the short stay nurse when you get to Short Stay.  . If you are admitted to the hospital after surgery: o Your blood sugar will be checked by the staff and you will probably be given insulin after surgery (instead of oral diabetes medicines) to make sure you have good blood sugar levels. o The goal for blood sugar control after surgery is 80-180 mg/dL.              Do not wear jewelry, make-up or nail polish.  Do not wear lotions, powders, or perfumes, or deodorant.  Do not shave 48 hours prior to surgery.  Men may shave face and neck.  Do not bring valuables to the hospital.  [facility] is not responsible for any belongings or valuables.  Contacts, dentures or bridgework may not be worn  into surgery.  Leave your suitcase in the car.  After surgery it may be brought to your room.  For patients admitted to the hospital, discharge time will be determined by your treatment team.  Patients discharged the day of surgery will not be allowed to drive home.   Special instructions:    - Preparing For Surgery  Before surgery, you can play an important role. Because skin is not sterile, your skin needs to be as free of germs as possible. You can reduce the number of germs on your skin by washing with CHG (chlorahexidine gluconate) Soap before surgery.  CHG is an antiseptic cleaner  which kills germs and bonds with the skin to continue killing germs even after washing.    Oral Hygiene is also important to reduce your risk of infection.  Remember - BRUSH YOUR TEETH THE MORNING OF SURGERY WITH YOUR REGULAR TOOTHPASTE  Please do not use if you have an allergy to CHG or antibacterial soaps. If your skin becomes reddened/irritated stop using the CHG.  Do not shave (including legs and underarms) for at least 48 hours prior to first CHG shower. It is OK to shave your face.  Please follow these instructions carefully.   1. Shower the NIGHT BEFORE SURGERY and the MORNING OF SURGERY with CHG.   2. If you chose to wash your hair, wash your hair first as usual with your normal shampoo.  3. After you shampoo, rinse your hair and body thoroughly to remove the shampoo.  4. Use CHG as you would any other liquid soap. You can apply CHG directly to the skin and wash gently with a scrungie or a clean washcloth.   5. Apply the CHG Soap to your body ONLY FROM THE NECK DOWN.  Do not use on open wounds or open sores. Avoid contact with your eyes, ears, mouth and genitals (private parts). Wash Face and genitals (private parts)  with your normal soap.  6. Wash thoroughly, paying special attention to the area where your surgery will be performed.  7. Thoroughly rinse your body with warm water from the neck down.  8. DO NOT shower/wash with your normal soap after using and rinsing off the CHG Soap.  9. Pat yourself dry with a CLEAN TOWEL.  10. Wear CLEAN PAJAMAS to bed the night before surgery, wear comfortable clothes the morning of surgery  11. Place CLEAN SHEETS on your bed the night of your first shower and DO NOT SLEEP WITH PETS.    Day of Surgery:  Do not apply any deodorants/lotions.  Please wear clean clothes to the hospital/surgery center.   Remember to brush your teeth WITH YOUR REGULAR TOOTHPASTE.    Please read over the following fact sheets that you were  given. Coughing and Deep Breathing, MRSA Information and Surgical Site Infection Prevention

## 2018-08-14 ENCOUNTER — Encounter (HOSPITAL_COMMUNITY)
Admission: RE | Admit: 2018-08-14 | Discharge: 2018-08-14 | Disposition: A | Discharge status: Home / Self Care | Payer: Medicare Other | Source: Ambulatory Visit | Attending: Neurosurgery | Admitting: Neurosurgery

## 2018-08-14 ENCOUNTER — Encounter (HOSPITAL_COMMUNITY): Payer: Self-pay

## 2018-08-14 ENCOUNTER — Other Ambulatory Visit: Payer: Self-pay

## 2018-08-14 DIAGNOSIS — I2510 Atherosclerotic heart disease of native coronary artery without angina pectoris: Secondary | ICD-10-CM | POA: Insufficient documentation

## 2018-08-14 DIAGNOSIS — Z951 Presence of aortocoronary bypass graft: Secondary | ICD-10-CM | POA: Diagnosis not present

## 2018-08-14 DIAGNOSIS — J45909 Unspecified asthma, uncomplicated: Secondary | ICD-10-CM | POA: Diagnosis not present

## 2018-08-14 DIAGNOSIS — X58XXXA Exposure to other specified factors, initial encounter: Secondary | ICD-10-CM | POA: Diagnosis not present

## 2018-08-14 DIAGNOSIS — Z79899 Other long term (current) drug therapy: Secondary | ICD-10-CM | POA: Diagnosis not present

## 2018-08-14 DIAGNOSIS — Z01818 Encounter for other preprocedural examination: Secondary | ICD-10-CM | POA: Insufficient documentation

## 2018-08-14 DIAGNOSIS — Z6841 Body Mass Index (BMI) 40.0 and over, adult: Secondary | ICD-10-CM | POA: Diagnosis not present

## 2018-08-14 DIAGNOSIS — F329 Major depressive disorder, single episode, unspecified: Secondary | ICD-10-CM | POA: Diagnosis not present

## 2018-08-14 DIAGNOSIS — Z7982 Long term (current) use of aspirin: Secondary | ICD-10-CM | POA: Diagnosis not present

## 2018-08-14 DIAGNOSIS — S32009A Unspecified fracture of unspecified lumbar vertebra, initial encounter for closed fracture: Secondary | ICD-10-CM | POA: Diagnosis not present

## 2018-08-14 DIAGNOSIS — R7303 Prediabetes: Secondary | ICD-10-CM | POA: Diagnosis not present

## 2018-08-14 DIAGNOSIS — Z87891 Personal history of nicotine dependence: Secondary | ICD-10-CM | POA: Diagnosis not present

## 2018-08-14 HISTORY — DX: Gastro-esophageal reflux disease without esophagitis: K21.9

## 2018-08-14 LAB — CBC
HCT: 42.3 % (ref 39.0–52.0)
HEMOGLOBIN: 13.2 g/dL (ref 13.0–17.0)
MCH: 28.9 pg (ref 26.0–34.0)
MCHC: 31.2 g/dL (ref 30.0–36.0)
MCV: 92.6 fL (ref 80.0–100.0)
PLATELETS: 222 10*3/uL (ref 150–400)
RBC: 4.57 MIL/uL (ref 4.22–5.81)
RDW: 13.9 % (ref 11.5–15.5)
WBC: 9.7 10*3/uL (ref 4.0–10.5)
nRBC: 0 % (ref 0.0–0.2)

## 2018-08-14 LAB — BASIC METABOLIC PANEL
ANION GAP: 9 (ref 5–15)
BUN: 23 mg/dL (ref 8–23)
CHLORIDE: 103 mmol/L (ref 98–111)
CO2: 25 mmol/L (ref 22–32)
Calcium: 9.3 mg/dL (ref 8.9–10.3)
Creatinine, Ser: 1.17 mg/dL (ref 0.61–1.24)
GFR calc Af Amer: >60 mL/min (ref >60)
GLUCOSE: 114 mg/dL — AB (ref 70–99)
POTASSIUM: 4.2 mmol/L (ref 3.5–5.1)
Sodium: 137 mmol/L (ref 135–145)

## 2018-08-14 LAB — TYPE AND SCREEN
ABO/RH(D): A POS
Antibody Screen: NEGATIVE

## 2018-08-14 LAB — SURGICAL PCR SCREEN
MRSA, PCR: NEGATIVE
Staphylococcus aureus: NEGATIVE

## 2018-08-14 NOTE — Progress Notes (Signed)
   08/14/18 1252  OBSTRUCTIVE SLEEP APNEA  Have you ever been diagnosed with sleep apnea through a sleep study? No  Do you snore loudly (loud enough to be heard through closed doors)?  0  Do you often feel tired, fatigued, or sleepy during the daytime (such as falling asleep during driving or talking to someone)? 0  Has anyone observed you stop breathing during your sleep? 0  Do you have, or are you being treated for high blood pressure? 1  BMI more than 35 kg/m2? 0  Age > 50 (1-yes) 1  Neck circumference greater than:Male 16 inches or larger, Male 17inches or larger? 1  Male Gender (Yes=1) 1  Obstructive Sleep Apnea Score 4  Score 5 or greater  Results sent to PCP

## 2018-08-14 NOTE — Pre-Procedure Instructions (Signed)
Michael Barr  08/14/2018      [id number] [place] Barr, [place] - [address] [place] [address] Phone: [phone] Fax: [phone]  [id number] Michael Barr, [place] [address] [place] [address] Phone: [phone] Fax: [phone]    Your procedure is scheduled on Monday October 28.   Report to [facility] Admitting at 5:30 A.M.             (posted surgery time 7:30a - 4:30p)   Call this number if you have problems the morning of surgery:  [phone]   Remember:  Do not eat any foods or drink any liquids after midnight, Sunday with the exception of the below.    Take these medicines the morning of surgery with A SIP OF WATER:   Acetaminophen (tylenol) if needed Alprazolam (Xanax) Oxycodone if needed  7 days prior to surgery STOP taking any Aleve, Naproxen, Ibuprofen, Motrin, Advil, Goody's, BC's, all herbal medications, fish oil, and all vitamins  Follow your surgeon's instructions on stopping Aspirin. If no instructions were given, please call your surgeon's office.    Do not wear jewelry, make-up or nail polish.  Do not wear lotions, powders, or perfumes, or deodorant.  Do not shave 48 hours prior to surgery.     Do not bring valuables to the hospital.  [facility] is not responsible for any belongings or valuables.  Contacts, dentures or bridgework may not be worn into surgery.  Leave your suitcase in the car.  After surgery it may be brought to your room.  For patients admitted to the hospital, discharge time will be determined by your treatment team.     Special instructions:    [facility]- Preparing For Surgery  Before surgery, you can play an important role. Because skin is not sterile, your skin needs to be as free of germs as possible. You can reduce the number of germs on your skin by washing with CHG (chlorahexidine gluconate) Soap before surgery.  CHG  is an antiseptic cleaner which kills germs and bonds with the skin to continue killing germs even after washing.    Oral Hygiene is also important to reduce your risk of infection.    Remember - BRUSH YOUR TEETH THE MORNING OF SURGERY WITH YOUR REGULAR TOOTHPASTE  Please do not use if you have an allergy to CHG or antibacterial soaps. If your skin becomes reddened/irritated stop using the CHG.  Do not shave (including legs and underarms) for at least 48 hours prior to first CHG shower. It is OK to shave your face.  Please follow these instructions carefully.   1. Shower the NIGHT BEFORE SURGERY and the MORNING OF SURGERY with CHG.   2. If you chose to wash your hair, wash your hair first as usual with your normal shampoo.  3. After you shampoo, rinse your hair and body thoroughly to remove the shampoo.  4. Use CHG as you would any other liquid soap. You can apply CHG directly to the skin and wash gently with a scrungie or a clean washcloth.   5. Apply the CHG Soap to your body ONLY FROM THE NECK DOWN.  Do not use on open wounds or open sores. Avoid contact with your eyes, ears, mouth and genitals (private parts). Wash Face and genitals (private parts)  with your normal soap.  6. Wash thoroughly, paying special attention to the area where your surgery  will be performed.  7. Thoroughly rinse your body with warm water from the neck down.  8. DO NOT shower/wash with your normal soap after using and rinsing off the CHG Soap.  9. Pat yourself dry with a CLEAN TOWEL.  10. Wear CLEAN PAJAMAS to bed the night before surgery, wear comfortable clothes the morning of surgery  11. Place CLEAN SHEETS on your bed the night of your first shower and DO NOT SLEEP WITH PETS.  Day of Surgery:  Do not apply any deodorants/lotions.  Please wear clean clothes to the hospital/surgery center.   Remember to brush your teeth WITH YOUR REGULAR TOOTHPASTE.  Please read over the following fact sheets that  you were given. MRSA Information and Surgical Site Infection Prevention       How to Manage Your Diabetes Before and After Surgery  Why is it important to control my blood sugar before and after surgery? . Improving blood sugar levels before and after surgery helps healing and can limit problems. . A way of improving blood sugar control is eating a healthy diet by: o  Eating less sugar and carbohydrates o  Increasing activity/exercise o  Talking with your doctor about reaching your blood sugar goals . High blood sugars (greater than 180 mg/dL) can raise your risk of infections and slow your recovery, so you will need to focus on controlling your diabetes during the weeks before surgery. . Make sure that the doctor who takes care of your diabetes knows about your planned surgery including the date and location.  How do I manage my blood sugar before surgery? . Check your blood sugar at least 4 times a day, starting 2 days before surgery, to make sure that the level is not too high or low. o Check your blood sugar the morning of your surgery when you wake up and every 2 hours until you get to the Short Stay unit. o  . If your blood sugar is less than 70 mg/dL, you will need to treat for low blood sugar: o Do not take insulin. o Treat a low blood sugar (less than 70 mg/dL) with  cup of clear juice (cranberry or apple), 4 glucose tablets, OR glucose gel.  NO ORANGE JUICE. o  Recheck blood sugar in 15 minutes after treatment (to make sure it is greater than 70 mg/dL). If your blood sugar is not greater than 70 mg/dL on recheck, call [phone] o  for further instructions. . Report your blood sugar to the short stay nurse when you get to Short Stay.  . If you are admitted to the hospital after surgery: o Your blood sugar will be checked by the staff and you will probably be given insulin after surgery (instead of oral diabetes medicines) to make sure you have good blood sugar  levels. o The goal for blood sugar control after surgery is 80-180 mg/dL.  WHAT DO I DO ABOUT MY DIABETES MEDICATION?   Marland Kitchen Do not take oral diabetes medicines (pills) the morning of surgery.  Patient Signature:  Date:   Nurse Signature:  Date:   Reviewed and Endorsed by [facility] Patient Education Committee, August 2015

## 2018-08-14 NOTE — Progress Notes (Addendum)
PCP now is Dr. Lance Sell  LOV 04/2018 Pt states that back in 1995, had blockage, then CABG.   Denies murmur, cp, sob.  States he thinks he had stress test back in 2017 or 2018 with Dr. Rosalita Chessman  [phone] (spoke with office).  He had Lexiscan back in 08/2015, was suppose to go back for follow up, but didn't.

## 2018-08-14 NOTE — Progress Notes (Addendum)
Anesthesia PAT Evaluatin:  Case:  [id number] Date/Time:  08/21/18 0716   Procedures:      Posterior Lateral Fusion T8 to the pelvis with iliac screws and fenestrated screws with kyphon and AIRO (N/A Back)     APPLICATION OF INTRAOPERATIVE CT SCAN (N/A )   Anesthesia type:  General   Pre-op diagnosis:  Lumbar fracture   Location:  [facility] OR ROOM 21 / [place] OR   Surgeon:  Kary Kos, MD      DISCUSSION: Patient is a 72 year old male scheduled for the above procedure. Currently OR room booked from 7:30 AM - 4:46 PM, although patient reports he was told surgery could last 4 to 6 hours depending on operative findings.   History includes former smoker, CAD s/p CABG X 1 (LIMA-LAD; [facility]) 11/12/94, asthma, anxiety, depression, "borderline" diabetic, arthritis, fibromyalgia, chronic back pain, insomnia. This will be his 7th back surgery since 20111 (s/p decompressive L3-4 laminectomy 10/21/10, right L3-4 laminectomy 09/26/13, L3-5 PLIF 12/14/13, PLIF L1-2 and removal L3-5 hardware 09/12/15, L1-2 hardware removal 03/26/16, PLIF2 revision with extension to T10-L2 01/28/17). BMI is significant for morbid obesity.   Patient is not routinely followed by cardiology. Last stress test was just under three years ago. He reports that ~ 1996 he noticed pressure at his sternal notch following a medication change which lead to an abnormal stress test followed by [facility] showing 1V CAD. He underwent LIMA-LAD 11/12/94. He denied an MI. He denied any recurrent chest or sternal notch pain or pressure. He has not had to take Nitro. He denied SOB or dyspnea with exertion; however, since after his 01/2017 surgery, his activity has been more limited. Previously was walking around his local mall with only 1 or 2 stops due to back pain. More recently has not been able to walk around the mall, but can do his own shopping using his Rollator walker. Back and RLE pain worse with standing. 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 No orthopnea or PND. Sleep on his  left side. Denied palpitations, edema, syncope/near-syncope. He is not on a b-blocker. He is on an ACE inhibitor, ASA, statin. Reports medication compliance. Reports instructions to hold ASA 5-7 days prior to surgery.     Discussed with anesthesiologist Roberts Gaudy, MD. Patient with known CAD, borderline DM, morbid obesity, and recently has not been able to be very active. Since patient has not had cardiology follow-up within ~ 3 years, would recommend preoperative evaluation. Nikki at Dr. Windy Carina office notified.  A1c in process.  ADDENDUM 08/15/18 10:31 AM: A1c 5.7. Patient to be seen at [facility] by Jeri Lager, NP on 08/16/18 at 2:30 PM for preoperative cardiology evaluation. They have access to Epic, so she can review available information in my note. His 1996 operative note can be viewed in [facility], but not his pre-CABG cath. 2016 stress test is outlined below and scanned under Media tab.  ADDENDUM 08/18/18 4:26 PM: I called [facility] earlier this afternoon to request that stress, echo, and office note be faxed to PAT today, but still not yet received. I just now called for follow-up, but answering service has already been turned on. Lorriane Shire from Dr. Windy Carina office did report that Dr. Einar Gip had just called and spoke personally with Dr. Saintclair Halsted and told him that Michael Barr was being cleared for upcoming surgery and that reports would be faxed as soon as available.    VS: BP (!) 154/76   Pulse 84   Temp  36.7 C   Resp 20   Ht 5\' 7"  (1.702 m)   Wt 130.6 kg   SpO2 96%   BMI 45.09 kg/m  Patient is a pleasant Caucasian male in NAD. Heart RRR, no murmur noted. No carotid bruits noted. Lungs clear. No significant pretibial edema, although both legs are large.    PROVIDERS: Mikey Kirschner, MD is PCP, established sine 05/02/17. (Previously saw Quincy Simmonds, MD in [place], [place].) - He is not routinely followed by a cardiologist. Last seen for a  preoperative stress test 08/2015 by Cleora Fleet, MD in 2016.    LABS: Labs reviewed: Acceptable for surgery. A1c pending.  (all labs ordered are listed, but only abnormal results are displayed)  Labs Reviewed  BASIC METABOLIC PANEL - Abnormal; Notable for the following components:      Result Value   Glucose, Bld 114 (*)    All other components within normal limits  HEMOGLOBIN A1C - Abnormal; Notable for the following components:   Hgb A1c MFr Bld 5.7 (*)    All other components within normal limits  SURGICAL PCR SCREEN  CBC  TYPE AND SCREEN    IMAGES: CT T-spine 06/23/18: IMPRESSION: 1. T10-L5 posterolateral fusion with multilevel lumbar discectomy and laminectomy. 2. L4-5 pseudoarthrosis. The L5 pedicle screws are not loosened but have become dissociated from the rod on the right and has limited purchase on the left. Residual foraminal narrowing on both sides at this level. 3. T11-12 probable pseudoarthrosis with mild loosening of the T10 and T11 pedicle screws. 4. There is ankylosis above and below the construct, seen from T5-T10 and L5-S1.   EKG: 08/14/18: SR, PAC. Automated reading measured a PR interval of 98 ms, however, in lead II I am measuring a PR interval of 160 ms.   CV: Nuclear stress test 09/09/15 [facility]; scanned under Media tab, Correspondence 09/12/15): Normal Lexiscan myocardial perfusion imaging study with no evidence of ischemia or prior infarction, EF 63%, no wall motion abnormalities.   Last cardiac cath done prior to his CABG '96.   Past Medical History:  Diagnosis Date  . Anxiety    takes Xanax and Methadone daily  . Arthritis    low back- DDD  . Asthma 70's  . Cataract    immature and to the left eye  . Chronic back pain    HNP  . Constipation    takes Sennokot nightly  . Coronary artery disease   . Depression    attacks  . Diabetes mellitus without complication (HCC)    borderline   . Fibromyalgia   . GERD (gastroesophageal  reflux disease)    20 YRS AGO, AND DIET CONTROL  . History of kidney stones   . Hyperlipidemia    takes Zocor daily  . Hypertension    takes Zestoretic daily  . Hypokalemia    takes Potassium daily  . Insomnia    takes trazodone nightly  . Joint pain   . Joint swelling   . Nocturia   . Pneumonia 70's  . Weakness    right leg    Past Surgical History:  Procedure Laterality Date  . BACK SURGERY     multiple back surgeries  . CARDIAC CATHETERIZATION     unsure of how long ago  . CARPAL TUNNEL RELEASE Bilateral 70's  . CHOLECYSTECTOMY  1985  . COLONOSCOPY    . CORONARY ARTERY BYPASS GRAFT  1996   1 vessel  . HARDWARE REMOVAL N/A 03/26/2016   Procedure:  HARDWARE REMOVAL Lumbar one-two ;  Surgeon: Kary Kos, MD;  Location: [place];  Service: Neurosurgery;  Laterality: N/A;  . JOINT REPLACEMENT  1999   lt knee  . KNEE ARTHROSCOPY Right   . LUMBAR LAMINECTOMY/DECOMPRESSION MICRODISCECTOMY Right 09/26/2013   Procedure: LUMBAR LAMINECTOMY/DECOMPRESSION MICRODISCECTOMY RIGHT THREE-FOUR;  Surgeon: Elaina Hoops, MD;  Location: [place] ORS;  Service: Neurosurgery;  Laterality: Right;  . pilondial cyst     x 2   . SHOULDER ARTHROSCOPY Bilateral    left x 2 and right x 1  . TONSILLECTOMY    . tumor removed from right ankle   70's    MEDICATIONS: . acetaminophen (TYLENOL) 500 MG tablet  . ALPRAZolam (XANAX) 0.5 MG tablet  . aspirin EC 325 MG tablet  . diphenhydrAMINE (BENADRYL) 25 mg capsule  . lisinopril-hydrochlorothiazide (PRINZIDE,ZESTORETIC) 20-25 MG tablet  . methadone (DOLOPHINE) 10 MG tablet  . Multiple Vitamins-Minerals (MULTIVITAMIN WITH MINERALS) tablet  . nitroGLYCERIN (NITROSTAT) 0.4 MG SL tablet  . Oxycodone HCl 10 MG TABS  . Potassium 99 MG TABS  . senna (SENOKOT) 8.6 MG TABS tablet  . simvastatin (ZOCOR) 40 MG tablet  . traZODone (DESYREL) 100 MG tablet   No current facility-administered medications for this encounter.     [facility]  Short Stay Center/Anesthesiology Phone [phone] 08/14/2018 5:06 PM

## 2018-08-15 LAB — HEMOGLOBIN A1C
Hgb A1c MFr Bld: 5.7 % — ABNORMAL HIGH (ref 4.8–5.6)
Mean Plasma Glucose: 117 mg/dL

## 2018-08-16 DIAGNOSIS — Z951 Presence of aortocoronary bypass graft: Secondary | ICD-10-CM | POA: Diagnosis not present

## 2018-08-16 DIAGNOSIS — I10 Essential (primary) hypertension: Secondary | ICD-10-CM | POA: Diagnosis not present

## 2018-08-16 DIAGNOSIS — I2510 Atherosclerotic heart disease of native coronary artery without angina pectoris: Secondary | ICD-10-CM | POA: Diagnosis not present

## 2018-08-16 DIAGNOSIS — Z01810 Encounter for preprocedural cardiovascular examination: Secondary | ICD-10-CM | POA: Diagnosis not present

## 2018-08-18 ENCOUNTER — Telehealth: Payer: Self-pay | Admitting: Family Medicine

## 2018-08-18 DIAGNOSIS — Z01810 Encounter for preprocedural cardiovascular examination: Secondary | ICD-10-CM | POA: Diagnosis not present

## 2018-08-18 MED ORDER — VANCOMYCIN HCL 10 G IV SOLR
1500.0000 mg | INTRAVENOUS | Status: AC
  Administered 2018-08-21: 1500 mg via INTRAVENOUS
  Filled 2018-08-18: qty 1500

## 2018-08-18 NOTE — Telephone Encounter (Signed)
Michael Barr with Dr. Saintclair Halsted office calling checking on surgical clearance that was faxed over on Monday. He is having surgery Monday morning at [facility].  [phone] ext [phone]  [phone]

## 2018-08-18 NOTE — Anesthesia Preprocedure Evaluation (Addendum)
Anesthesia Evaluation  Patient identified by MRN, date of birth, ID band Patient awake    Reviewed: Allergy & Precautions, NPO status , Patient's Chart, lab work & pertinent test results  History of Anesthesia Complications Negative for: history of anesthetic complications  Airway Mallampati: III  TM Distance: >3 FB Neck ROM: Full   Comment: Previous glidescope #4 Dental no notable dental hx. (+) Teeth Intact, Dental Advisory Given   Pulmonary former smoker,    Pulmonary exam normal breath sounds clear to auscultation       Cardiovascular hypertension, Pt. on medications + CAD and + CABG (1996)  Normal cardiovascular exam Rhythm:Regular Rate:Normal     Neuro/Psych Anxiety Depression Lumbar radiculopathy    GI/Hepatic (+)     substance abuse (methadone, oxycodone)  ,   Endo/Other  diabetes, Type 2Morbid obesity  Renal/GU      Musculoskeletal  (+) Arthritis , Osteoarthritis,  Fibromyalgia -, narcotic dependent  Abdominal   Peds  Hematology negative hematology ROS (+)   Anesthesia Other Findings   Reproductive/Obstetrics                          Anesthesia Physical Anesthesia Plan  ASA: III  Anesthesia Plan: General   Post-op Pain Management:    Induction: Intravenous  PONV Risk Score and Plan: 2 and Treatment may vary due to age or medical condition, Ondansetron and Dexamethasone  Airway Management Planned: Oral ETT and Video Laryngoscope Planned  Additional Equipment: Arterial line  Intra-op Plan:   Post-operative Plan: Extubation in OR  Informed Consent: I have reviewed the patients History and Physical, chart, labs and discussed the procedure including the risks, benefits and alternatives for the proposed anesthesia with the patient or authorized representative who has indicated his/her understanding and acceptance.   Dental advisory given  Plan Discussed with: CRNA and  Surgeon  Anesthesia Plan Comments: (See PAT note written 08/18/2018 by Myra Gianotti, PA-C. )     Anesthesia Quick Evaluation

## 2018-08-18 NOTE — Telephone Encounter (Signed)
Contacted Vanessa at [facility] office. She states that the patient had just called them to notify that he had just left the cardiologist and they were going to use the cardiologist notes.

## 2018-08-21 ENCOUNTER — Inpatient Hospital Stay (HOSPITAL_COMMUNITY): Payer: Medicare Other

## 2018-08-21 ENCOUNTER — Inpatient Hospital Stay (HOSPITAL_COMMUNITY)
Admission: RE | Disposition: A | Discharge status: Home Health | Payer: Self-pay | Source: Home / Self Care | Attending: Neurosurgery

## 2018-08-21 ENCOUNTER — Inpatient Hospital Stay (HOSPITAL_COMMUNITY)
Admission: RE | Admit: 2018-08-21 | Discharge: 2018-08-25 | DRG: 460 | Disposition: A | Discharge status: Home Health | Payer: Medicare Other | Attending: Neurosurgery | Admitting: Neurosurgery

## 2018-08-21 ENCOUNTER — Encounter (HOSPITAL_COMMUNITY): Payer: Self-pay | Admitting: *Deleted

## 2018-08-21 ENCOUNTER — Inpatient Hospital Stay (HOSPITAL_COMMUNITY): Payer: Medicare Other | Admitting: Anesthesiology

## 2018-08-21 ENCOUNTER — Inpatient Hospital Stay (HOSPITAL_COMMUNITY): Payer: Medicare Other | Admitting: Vascular Surgery

## 2018-08-21 DIAGNOSIS — K5900 Constipation, unspecified: Secondary | ICD-10-CM | POA: Diagnosis present

## 2018-08-21 DIAGNOSIS — E785 Hyperlipidemia, unspecified: Secondary | ICD-10-CM | POA: Diagnosis not present

## 2018-08-21 DIAGNOSIS — Z87891 Personal history of nicotine dependence: Secondary | ICD-10-CM | POA: Diagnosis not present

## 2018-08-21 DIAGNOSIS — T84226A Displacement of internal fixation device of vertebrae, initial encounter: Principal | ICD-10-CM | POA: Diagnosis present

## 2018-08-21 DIAGNOSIS — Z880 Allergy status to penicillin: Secondary | ICD-10-CM | POA: Diagnosis not present

## 2018-08-21 DIAGNOSIS — F419 Anxiety disorder, unspecified: Secondary | ICD-10-CM | POA: Diagnosis present

## 2018-08-21 DIAGNOSIS — Z87442 Personal history of urinary calculi: Secondary | ICD-10-CM

## 2018-08-21 DIAGNOSIS — M5416 Radiculopathy, lumbar region: Secondary | ICD-10-CM | POA: Diagnosis present

## 2018-08-21 DIAGNOSIS — I2510 Atherosclerotic heart disease of native coronary artery without angina pectoris: Secondary | ICD-10-CM | POA: Diagnosis present

## 2018-08-21 DIAGNOSIS — M48062 Spinal stenosis, lumbar region with neurogenic claudication: Secondary | ICD-10-CM | POA: Diagnosis present

## 2018-08-21 DIAGNOSIS — Z981 Arthrodesis status: Secondary | ICD-10-CM | POA: Diagnosis not present

## 2018-08-21 DIAGNOSIS — G4700 Insomnia, unspecified: Secondary | ICD-10-CM | POA: Diagnosis present

## 2018-08-21 DIAGNOSIS — F329 Major depressive disorder, single episode, unspecified: Secondary | ICD-10-CM | POA: Diagnosis present

## 2018-08-21 DIAGNOSIS — Z96652 Presence of left artificial knee joint: Secondary | ICD-10-CM | POA: Diagnosis present

## 2018-08-21 DIAGNOSIS — E876 Hypokalemia: Secondary | ICD-10-CM | POA: Diagnosis present

## 2018-08-21 DIAGNOSIS — S32009K Unspecified fracture of unspecified lumbar vertebra, subsequent encounter for fracture with nonunion: Secondary | ICD-10-CM

## 2018-08-21 DIAGNOSIS — M797 Fibromyalgia: Secondary | ICD-10-CM | POA: Diagnosis present

## 2018-08-21 DIAGNOSIS — Z79891 Long term (current) use of opiate analgesic: Secondary | ICD-10-CM | POA: Diagnosis not present

## 2018-08-21 DIAGNOSIS — G8929 Other chronic pain: Secondary | ICD-10-CM | POA: Diagnosis present

## 2018-08-21 DIAGNOSIS — Z419 Encounter for procedure for purposes other than remedying health state, unspecified: Secondary | ICD-10-CM

## 2018-08-21 DIAGNOSIS — Z79899 Other long term (current) drug therapy: Secondary | ICD-10-CM | POA: Diagnosis not present

## 2018-08-21 DIAGNOSIS — Z7982 Long term (current) use of aspirin: Secondary | ICD-10-CM

## 2018-08-21 DIAGNOSIS — K219 Gastro-esophageal reflux disease without esophagitis: Secondary | ICD-10-CM | POA: Diagnosis present

## 2018-08-21 DIAGNOSIS — I10 Essential (primary) hypertension: Secondary | ICD-10-CM | POA: Diagnosis present

## 2018-08-21 DIAGNOSIS — M960 Pseudarthrosis after fusion or arthrodesis: Secondary | ICD-10-CM | POA: Diagnosis present

## 2018-08-21 DIAGNOSIS — M4326 Fusion of spine, lumbar region: Secondary | ICD-10-CM | POA: Diagnosis not present

## 2018-08-21 DIAGNOSIS — Y838 Other surgical procedures as the cause of abnormal reaction of the patient, or of later complication, without mention of misadventure at the time of the procedure: Secondary | ICD-10-CM | POA: Diagnosis present

## 2018-08-21 DIAGNOSIS — Z9049 Acquired absence of other specified parts of digestive tract: Secondary | ICD-10-CM

## 2018-08-21 DIAGNOSIS — Z951 Presence of aortocoronary bypass graft: Secondary | ICD-10-CM | POA: Diagnosis not present

## 2018-08-21 HISTORY — PX: POSTERIOR LUMBAR FUSION 4 LEVEL: SHX6037

## 2018-08-21 HISTORY — PX: APPLICATION OF INTRAOPERATIVE CT SCAN: SHX6668

## 2018-08-21 SURGERY — POSTERIOR LUMBAR FUSION 4 LEVEL
Anesthesia: General | Site: Spine Lumbar

## 2018-08-21 MED ORDER — ROCURONIUM BROMIDE 50 MG/5ML IV SOSY
PREFILLED_SYRINGE | INTRAVENOUS | Status: DC | PRN
  Administered 2018-08-21 (×2): 50 mg via INTRAVENOUS
  Administered 2018-08-21: 100 mg via INTRAVENOUS
  Administered 2018-08-21 (×2): 50 mg via INTRAVENOUS

## 2018-08-21 MED ORDER — FENTANYL CITRATE (PF) 250 MCG/5ML IJ SOLN
INTRAMUSCULAR | Status: AC
  Filled 2018-08-21: qty 5

## 2018-08-21 MED ORDER — MIDAZOLAM HCL 2 MG/2ML IJ SOLN
INTRAMUSCULAR | Status: AC
  Filled 2018-08-21: qty 2

## 2018-08-21 MED ORDER — HYDROMORPHONE HCL 1 MG/ML IJ SOLN
INTRAMUSCULAR | Status: DC | PRN
  Administered 2018-08-21 (×3): 0.5 mg via INTRAVENOUS

## 2018-08-21 MED ORDER — THROMBIN (RECOMBINANT) 20000 UNITS EX SOLR
CUTANEOUS | Status: AC
  Filled 2018-08-21: qty 20000

## 2018-08-21 MED ORDER — DIPHENHYDRAMINE HCL 25 MG PO CAPS
25.0000 mg | ORAL_CAPSULE | Freq: Every evening | ORAL | Status: DC | PRN
  Administered 2018-08-22: 25 mg via ORAL
  Filled 2018-08-21: qty 1

## 2018-08-21 MED ORDER — HYDROMORPHONE HCL 1 MG/ML IJ SOLN
INTRAMUSCULAR | Status: AC
  Filled 2018-08-21: qty 1

## 2018-08-21 MED ORDER — ROCURONIUM BROMIDE 50 MG/5ML IV SOSY
PREFILLED_SYRINGE | INTRAVENOUS | Status: AC
  Filled 2018-08-21: qty 10

## 2018-08-21 MED ORDER — LACTATED RINGERS IV SOLN
INTRAVENOUS | Status: DC
  Administered 2018-08-21 – 2018-08-24 (×8): via INTRAVENOUS

## 2018-08-21 MED ORDER — PROPOFOL 10 MG/ML IV BOLUS
INTRAVENOUS | Status: DC | PRN
  Administered 2018-08-21: 200 mg via INTRAVENOUS

## 2018-08-21 MED ORDER — SUGAMMADEX SODIUM 500 MG/5ML IV SOLN
INTRAVENOUS | Status: DC | PRN
  Administered 2018-08-21: 270 mg via INTRAVENOUS

## 2018-08-21 MED ORDER — MIDAZOLAM HCL 5 MG/5ML IJ SOLN
INTRAMUSCULAR | Status: DC | PRN
  Administered 2018-08-21 (×2): 1 mg via INTRAVENOUS

## 2018-08-21 MED ORDER — KETAMINE HCL 50 MG/5ML IJ SOSY
PREFILLED_SYRINGE | INTRAMUSCULAR | Status: AC
  Filled 2018-08-21: qty 10

## 2018-08-21 MED ORDER — PROPOFOL 10 MG/ML IV BOLUS
INTRAVENOUS | Status: AC
  Filled 2018-08-21: qty 20

## 2018-08-21 MED ORDER — HYDROMORPHONE HCL 1 MG/ML IJ SOLN
0.2500 mg | INTRAMUSCULAR | Status: DC | PRN
  Administered 2018-08-21 (×2): 0.5 mg via INTRAVENOUS

## 2018-08-21 MED ORDER — DEXMEDETOMIDINE HCL IN NACL 200 MCG/50ML IV SOLN
INTRAVENOUS | Status: DC | PRN
  Administered 2018-08-21 (×3): 8 ug via INTRAVENOUS
  Administered 2018-08-21: 4 ug via INTRAVENOUS
  Administered 2018-08-21 (×4): 8 ug via INTRAVENOUS

## 2018-08-21 MED ORDER — CHLORHEXIDINE GLUCONATE CLOTH 2 % EX PADS: 6.0000 | MEDICATED_PAD | Freq: Once | CUTANEOUS | Status: DC

## 2018-08-21 MED ORDER — OXYCODONE HCL 5 MG PO TABS
10.0000 mg | ORAL_TABLET | Freq: Once | ORAL | Status: AC | PRN
  Administered 2018-08-21: 10 mg via ORAL

## 2018-08-21 MED ORDER — PHENYLEPHRINE HCL 10 MG/ML IJ SOLN
INTRAMUSCULAR | Status: DC | PRN
  Administered 2018-08-21: 40 ug via INTRAVENOUS
  Administered 2018-08-21 (×4): 80 ug via INTRAVENOUS

## 2018-08-21 MED ORDER — OXYCODONE HCL 5 MG PO TABS
10.0000 mg | ORAL_TABLET | ORAL | Status: DC | PRN
  Administered 2018-08-21 – 2018-08-25 (×9): 10 mg via ORAL
  Filled 2018-08-21 (×8): qty 2

## 2018-08-21 MED ORDER — LIDOCAINE 2% (20 MG/ML) 5 ML SYRINGE
INTRAMUSCULAR | Status: DC | PRN
  Administered 2018-08-21: 100 mg via INTRAVENOUS

## 2018-08-21 MED ORDER — ACETAMINOPHEN 325 MG PO TABS: 650.0000 mg | ORAL_TABLET | ORAL | Status: DC | PRN

## 2018-08-21 MED ORDER — SIMVASTATIN 40 MG PO TABS
40.0000 mg | ORAL_TABLET | Freq: Every day | ORAL | Status: DC
  Administered 2018-08-21 – 2018-08-24 (×4): 40 mg via ORAL
  Filled 2018-08-21 (×4): qty 1

## 2018-08-21 MED ORDER — KETAMINE HCL 10 MG/ML IJ SOLN
INTRAMUSCULAR | Status: DC | PRN
  Administered 2018-08-21: 60 mg via INTRAVENOUS
  Administered 2018-08-21 (×4): 10 mg via INTRAVENOUS

## 2018-08-21 MED ORDER — LABETALOL HCL 5 MG/ML IV SOLN
INTRAVENOUS | Status: AC
  Filled 2018-08-21: qty 4

## 2018-08-21 MED ORDER — DEXAMETHASONE SODIUM PHOSPHATE 10 MG/ML IJ SOLN
10.0000 mg | INTRAMUSCULAR | Status: AC
  Administered 2018-08-21: 10 mg via INTRAVENOUS
  Filled 2018-08-21: qty 1

## 2018-08-21 MED ORDER — ONDANSETRON HCL 4 MG/2ML IJ SOLN: 4.0000 mg | Freq: Four times a day (QID) | INTRAMUSCULAR | Status: DC | PRN

## 2018-08-21 MED ORDER — OXYCODONE HCL 5 MG PO TABS
10.0000 mg | ORAL_TABLET | Freq: Once | ORAL | Status: AC
  Administered 2018-08-21: 10 mg via ORAL

## 2018-08-21 MED ORDER — VANCOMYCIN HCL 1000 MG IV SOLR
INTRAVENOUS | Status: AC
  Filled 2018-08-21: qty 1000

## 2018-08-21 MED ORDER — LABETALOL HCL 5 MG/ML IV SOLN
INTRAVENOUS | Status: DC | PRN
  Administered 2018-08-21: 10 mg via INTRAVENOUS

## 2018-08-21 MED ORDER — BUPIVACAINE LIPOSOME 1.3 % IJ SUSP
20.0000 mL | INTRAMUSCULAR | Status: AC
  Administered 2018-08-21: 20 mL
  Filled 2018-08-21: qty 20

## 2018-08-21 MED ORDER — METHADONE HCL 10 MG PO TABS
5.0000 mg | ORAL_TABLET | Freq: Three times a day (TID) | ORAL | Status: DC
  Administered 2018-08-21 – 2018-08-25 (×11): 5 mg via ORAL
  Filled 2018-08-21 (×12): qty 1

## 2018-08-21 MED ORDER — HYDROMORPHONE HCL 1 MG/ML IJ SOLN
INTRAMUSCULAR | Status: AC
  Filled 2018-08-21: qty 0.5

## 2018-08-21 MED ORDER — LIDOCAINE-EPINEPHRINE 1 %-1:100000 IJ SOLN
INTRAMUSCULAR | Status: AC
  Filled 2018-08-21: qty 1

## 2018-08-21 MED ORDER — ESMOLOL HCL 100 MG/10ML IV SOLN
INTRAVENOUS | Status: DC | PRN
  Administered 2018-08-21: 50 mg via INTRAVENOUS
  Administered 2018-08-21: 30 mg via INTRAVENOUS
  Administered 2018-08-21: 20 mg via INTRAVENOUS
  Administered 2018-08-21 (×2): 30 mg via INTRAVENOUS

## 2018-08-21 MED ORDER — SODIUM CHLORIDE 0.9% FLUSH: 3.0000 mL | INTRAVENOUS | Status: DC | PRN

## 2018-08-21 MED ORDER — SENNA 8.6 MG PO TABS
2.0000 | ORAL_TABLET | Freq: Every day | ORAL | Status: DC
  Administered 2018-08-21 – 2018-08-24 (×4): 17.2 mg via ORAL
  Filled 2018-08-21 (×4): qty 2

## 2018-08-21 MED ORDER — ACETAMINOPHEN 10 MG/ML IV SOLN: 1000.0000 mg | Freq: Once | INTRAVENOUS | Status: DC | PRN

## 2018-08-21 MED ORDER — ACETAMINOPHEN 500 MG PO TABS
500.0000 mg | ORAL_TABLET | Freq: Three times a day (TID) | ORAL | Status: DC
  Administered 2018-08-21 – 2018-08-25 (×12): 500 mg via ORAL
  Filled 2018-08-21 (×12): qty 1

## 2018-08-21 MED ORDER — ROCURONIUM BROMIDE 50 MG/5ML IV SOSY
PREFILLED_SYRINGE | INTRAVENOUS | Status: AC
  Filled 2018-08-21: qty 5

## 2018-08-21 MED ORDER — SODIUM CHLORIDE 0.9 % IV SOLN: 250.0000 mL | INTRAVENOUS | Status: DC

## 2018-08-21 MED ORDER — ASPIRIN EC 325 MG PO TBEC
325.0000 mg | DELAYED_RELEASE_TABLET | Freq: Every day | ORAL | Status: DC
  Administered 2018-08-22 – 2018-08-25 (×4): 325 mg via ORAL
  Filled 2018-08-21 (×4): qty 1

## 2018-08-21 MED ORDER — ALPRAZOLAM 0.25 MG PO TABS
0.2500 mg | ORAL_TABLET | Freq: Three times a day (TID) | ORAL | Status: DC
  Administered 2018-08-21 – 2018-08-25 (×11): 0.25 mg via ORAL
  Filled 2018-08-21 (×12): qty 1

## 2018-08-21 MED ORDER — EPHEDRINE SULFATE-NACL 50-0.9 MG/10ML-% IV SOSY
PREFILLED_SYRINGE | INTRAVENOUS | Status: DC | PRN
  Administered 2018-08-21 (×3): 5 mg via INTRAVENOUS

## 2018-08-21 MED ORDER — PHENYLEPHRINE 40 MCG/ML (10ML) SYRINGE FOR IV PUSH (FOR BLOOD PRESSURE SUPPORT)
PREFILLED_SYRINGE | INTRAVENOUS | Status: AC
  Filled 2018-08-21: qty 10

## 2018-08-21 MED ORDER — MEPERIDINE HCL 50 MG/ML IJ SOLN: 6.2500 mg | INTRAMUSCULAR | Status: DC | PRN

## 2018-08-21 MED ORDER — LISINOPRIL-HYDROCHLOROTHIAZIDE 20-25 MG PO TABS: 1.0000 | ORAL_TABLET | Freq: Every morning | ORAL | Status: DC

## 2018-08-21 MED ORDER — PANTOPRAZOLE SODIUM 40 MG PO TBEC
40.0000 mg | DELAYED_RELEASE_TABLET | Freq: Every day | ORAL | Status: DC
  Administered 2018-08-21 – 2018-08-25 (×5): 40 mg via ORAL
  Filled 2018-08-21 (×5): qty 1

## 2018-08-21 MED ORDER — HYDROCHLOROTHIAZIDE 25 MG PO TABS
25.0000 mg | ORAL_TABLET | Freq: Every day | ORAL | Status: DC
  Administered 2018-08-22 – 2018-08-25 (×4): 25 mg via ORAL
  Filled 2018-08-21 (×4): qty 1

## 2018-08-21 MED ORDER — SODIUM CHLORIDE 0.9 % IV SOLN
INTRAVENOUS | Status: DC | PRN
  Administered 2018-08-21: 11:00:00

## 2018-08-21 MED ORDER — SODIUM CHLORIDE 0.9 % IV SOLN
INTRAVENOUS | Status: DC | PRN
  Administered 2018-08-21: 20 ug/min via INTRAVENOUS
  Administered 2018-08-21: 11:00:00 via INTRAVENOUS
  Administered 2018-08-21: 50 ug/min via INTRAVENOUS

## 2018-08-21 MED ORDER — ACETAMINOPHEN 650 MG RE SUPP: 650.0000 mg | RECTAL | Status: DC | PRN

## 2018-08-21 MED ORDER — SODIUM CHLORIDE 0.9% FLUSH
3.0000 mL | Freq: Two times a day (BID) | INTRAVENOUS | Status: DC
  Administered 2018-08-21 – 2018-08-25 (×8): 3 mL via INTRAVENOUS

## 2018-08-21 MED ORDER — EPHEDRINE 5 MG/ML INJ
INTRAVENOUS | Status: AC
  Filled 2018-08-21: qty 10

## 2018-08-21 MED ORDER — ONDANSETRON HCL 4 MG/2ML IJ SOLN
INTRAMUSCULAR | Status: DC | PRN
  Administered 2018-08-21: 4 mg via INTRAVENOUS

## 2018-08-21 MED ORDER — ADULT MULTIVITAMIN W/MINERALS CH
1.0000 | ORAL_TABLET | Freq: Every day | ORAL | Status: DC
  Administered 2018-08-22 – 2018-08-25 (×4): 1 via ORAL
  Filled 2018-08-21 (×4): qty 1

## 2018-08-21 MED ORDER — OXYCODONE HCL 5 MG/5ML PO SOLN: 10.0000 mg | Freq: Once | ORAL | Status: AC | PRN

## 2018-08-21 MED ORDER — ONDANSETRON HCL 4 MG/2ML IJ SOLN
INTRAMUSCULAR | Status: AC
  Filled 2018-08-21: qty 2

## 2018-08-21 MED ORDER — LISINOPRIL 20 MG PO TABS
20.0000 mg | ORAL_TABLET | Freq: Every day | ORAL | Status: DC
  Administered 2018-08-22 – 2018-08-25 (×4): 20 mg via ORAL
  Filled 2018-08-21 (×4): qty 1

## 2018-08-21 MED ORDER — OXYCODONE HCL 5 MG PO TABS
ORAL_TABLET | ORAL | Status: AC
  Filled 2018-08-21: qty 2

## 2018-08-21 MED ORDER — BUPIVACAINE HCL (PF) 0.25 % IJ SOLN
INTRAMUSCULAR | Status: AC
  Filled 2018-08-21: qty 30

## 2018-08-21 MED ORDER — LIDOCAINE-EPINEPHRINE 1 %-1:100000 IJ SOLN
INTRAMUSCULAR | Status: DC | PRN
  Administered 2018-08-21: 20 mL

## 2018-08-21 MED ORDER — THROMBIN 20000 UNITS EX SOLR
CUTANEOUS | Status: DC | PRN
  Administered 2018-08-21: 11:00:00 via TOPICAL

## 2018-08-21 MED ORDER — FENTANYL CITRATE (PF) 100 MCG/2ML IJ SOLN
INTRAMUSCULAR | Status: DC | PRN
  Administered 2018-08-21 (×4): 50 ug via INTRAVENOUS
  Administered 2018-08-21: 100 ug via INTRAVENOUS
  Administered 2018-08-21 (×6): 50 ug via INTRAVENOUS

## 2018-08-21 MED ORDER — VANCOMYCIN HCL 1000 MG IV SOLR
INTRAVENOUS | Status: DC | PRN
  Administered 2018-08-21: 1000 mg via TOPICAL

## 2018-08-21 MED ORDER — CYCLOBENZAPRINE HCL 10 MG PO TABS
10.0000 mg | ORAL_TABLET | Freq: Three times a day (TID) | ORAL | Status: DC | PRN
  Administered 2018-08-22 – 2018-08-25 (×4): 10 mg via ORAL
  Filled 2018-08-21 (×3): qty 1

## 2018-08-21 MED ORDER — POTASSIUM 99 MG PO TABS: 99.0000 mg | ORAL_TABLET | Freq: Every day | ORAL | Status: DC

## 2018-08-21 MED ORDER — HYDROMORPHONE HCL 1 MG/ML IJ SOLN
0.5000 mg | INTRAMUSCULAR | Status: DC | PRN
  Administered 2018-08-22 (×4): 0.5 mg via INTRAVENOUS
  Filled 2018-08-21 (×4): qty 1

## 2018-08-21 MED ORDER — ESMOLOL HCL 100 MG/10ML IV SOLN
INTRAVENOUS | Status: AC
  Filled 2018-08-21: qty 20

## 2018-08-21 MED ORDER — LIDOCAINE 2% (20 MG/ML) 5 ML SYRINGE
INTRAMUSCULAR | Status: AC
  Filled 2018-08-21: qty 5

## 2018-08-21 MED ORDER — 0.9 % SODIUM CHLORIDE (POUR BTL) OPTIME
TOPICAL | Status: DC | PRN
  Administered 2018-08-21: 1000 mL

## 2018-08-21 MED ORDER — PROMETHAZINE HCL 25 MG/ML IJ SOLN: 6.2500 mg | INTRAMUSCULAR | Status: DC | PRN

## 2018-08-21 MED ORDER — PHENOL 1.4 % MT LIQD: 1.0000 | OROMUCOSAL | Status: DC | PRN

## 2018-08-21 MED ORDER — NITROGLYCERIN 0.4 MG SL SUBL: 0.4000 mg | SUBLINGUAL_TABLET | SUBLINGUAL | Status: DC | PRN

## 2018-08-21 MED ORDER — OXYCODONE HCL 5 MG PO TABS
10.0000 mg | ORAL_TABLET | Freq: Three times a day (TID) | ORAL | Status: DC
  Administered 2018-08-21 – 2018-08-25 (×11): 10 mg via ORAL
  Filled 2018-08-21 (×12): qty 2

## 2018-08-21 MED ORDER — MULTI-VITAMIN/MINERALS PO TABS: 1.0000 | ORAL_TABLET | Freq: Every day | ORAL | Status: DC

## 2018-08-21 MED ORDER — ALUM & MAG HYDROXIDE-SIMETH 200-200-20 MG/5ML PO SUSP: 30.0000 mL | Freq: Four times a day (QID) | ORAL | Status: DC | PRN

## 2018-08-21 MED ORDER — SUGAMMADEX SODIUM 500 MG/5ML IV SOLN
INTRAVENOUS | Status: AC
  Filled 2018-08-21: qty 5

## 2018-08-21 MED ORDER — TRAZODONE HCL 150 MG PO TABS
250.0000 mg | ORAL_TABLET | Freq: Every day | ORAL | Status: DC
  Administered 2018-08-21 – 2018-08-24 (×4): 250 mg via ORAL
  Filled 2018-08-21 (×4): qty 1

## 2018-08-21 MED ORDER — ALBUMIN HUMAN 5 % IV SOLN
INTRAVENOUS | Status: DC | PRN
  Administered 2018-08-21 (×2): via INTRAVENOUS

## 2018-08-21 MED ORDER — MENTHOL 3 MG MT LOZG: 1.0000 | LOZENGE | OROMUCOSAL | Status: DC | PRN

## 2018-08-21 MED ORDER — ONDANSETRON HCL 4 MG PO TABS: 4.0000 mg | ORAL_TABLET | Freq: Four times a day (QID) | ORAL | Status: DC | PRN

## 2018-08-21 MED ORDER — VANCOMYCIN HCL IN DEXTROSE 1-5 GM/200ML-% IV SOLN
1000.0000 mg | Freq: Two times a day (BID) | INTRAVENOUS | Status: DC
  Administered 2018-08-22 – 2018-08-25 (×7): 1000 mg via INTRAVENOUS
  Filled 2018-08-21 (×8): qty 200

## 2018-08-21 SURGICAL SUPPLY — 82 items
BAG DECANTER FOR FLEXI CONT (MISCELLANEOUS) ×4 IMPLANT
BENZOIN TINCTURE PRP APPL 2/3 (GAUZE/BANDAGES/DRESSINGS) ×4 IMPLANT
BLADE SURG 11 STRL SS (BLADE) ×4 IMPLANT
BONE VIVIGEN FORMABLE 10CC (Bone Implant) ×8 IMPLANT
BUR CUTTER 7.0 ROUND (BURR) ×4 IMPLANT
BUR MATCHSTICK NEURO 3.0 LAGG (BURR) ×4 IMPLANT
CANISTER SUCT 3000ML PPV (MISCELLANEOUS) ×4 IMPLANT
CAP LOCKING CREO THRD (Cap) ×8 IMPLANT
CAP LOCKING THREADED (Cap) ×56 IMPLANT
CARTRIDGE OIL MAESTRO DRILL (MISCELLANEOUS) ×4 IMPLANT
CEMENT BONE KYPHX HV R (Orthopedic Implant) ×4 IMPLANT
CLOSURE WOUND 1/2 X4 (GAUZE/BANDAGES/DRESSINGS) ×2
CONN THRD HO CREO 30 LG (Connector) ×8 IMPLANT
CONNECTOR THRD HO CREO 30 LG (Connector) ×4 IMPLANT
CONT SPEC 4OZ CLIKSEAL STRL BL (MISCELLANEOUS) ×8 IMPLANT
COVER BACK TABLE 60X90IN (DRAPES) ×16 IMPLANT
COVER WAND RF STERILE (DRAPES) ×8 IMPLANT
CROSSLINK SPINAL FUSION (Cage) ×4 IMPLANT
DERMABOND ADVANCED (GAUZE/BANDAGES/DRESSINGS) ×4
DERMABOND ADVANCED .7 DNX12 (GAUZE/BANDAGES/DRESSINGS) ×4 IMPLANT
DIFFUSER DRILL AIR PNEUMATIC (MISCELLANEOUS) ×8 IMPLANT
DRAPE C-ARM 42X72 X-RAY (DRAPES) ×8 IMPLANT
DRAPE C-ARMOR (DRAPES) IMPLANT
DRAPE HALF SHEET 40X57 (DRAPES) ×4 IMPLANT
DRAPE LAPAROTOMY 100X72X124 (DRAPES) ×4 IMPLANT
DRAPE SCAN PATIENT (DRAPES) ×8 IMPLANT
DRAPE SURG 17X23 STRL (DRAPES) ×4 IMPLANT
DRIVER SHAFT CREO (MISCELLANEOUS) ×16 IMPLANT
DRSG OPSITE 4X5.5 SM (GAUZE/BANDAGES/DRESSINGS) ×4 IMPLANT
DRSG OPSITE POSTOP 4X10 (GAUZE/BANDAGES/DRESSINGS) ×8 IMPLANT
DURAPREP 26ML APPLICATOR (WOUND CARE) ×4 IMPLANT
ELECT REM PT RETURN 9FT ADLT (ELECTROSURGICAL) ×4
ELECTRODE REM PT RTRN 9FT ADLT (ELECTROSURGICAL) ×2 IMPLANT
EVACUATOR 3/16  PVC DRAIN (DRAIN) ×2
EVACUATOR 3/16 PVC DRAIN (DRAIN) ×2 IMPLANT
FIBER BOAT ALLOFUSE 15CC (Bone Implant) ×8 IMPLANT
GAUZE 4X4 16PLY RFD (DISPOSABLE) ×4 IMPLANT
GAUZE SPONGE 4X4 12PLY STRL (GAUZE/BANDAGES/DRESSINGS) IMPLANT
GLOVE BIO SURGEON STRL SZ7 (GLOVE) ×8 IMPLANT
GLOVE BIO SURGEON STRL SZ8 (GLOVE) ×8 IMPLANT
GLOVE BIOGEL PI IND STRL 7.0 (GLOVE) ×4 IMPLANT
GLOVE BIOGEL PI IND STRL 7.5 (GLOVE) ×4 IMPLANT
GLOVE BIOGEL PI INDICATOR 7.0 (GLOVE) ×4
GLOVE BIOGEL PI INDICATOR 7.5 (GLOVE) ×4
GLOVE INDICATOR 8.5 STRL (GLOVE) ×8 IMPLANT
GOWN STRL REUS W/ TWL LRG LVL3 (GOWN DISPOSABLE) ×6 IMPLANT
GOWN STRL REUS W/ TWL XL LVL3 (GOWN DISPOSABLE) ×4 IMPLANT
GOWN STRL REUS W/TWL 2XL LVL3 (GOWN DISPOSABLE) IMPLANT
GOWN STRL REUS W/TWL LRG LVL3 (GOWN DISPOSABLE) ×6
GOWN STRL REUS W/TWL XL LVL3 (GOWN DISPOSABLE) ×4
HEMOSTAT POWDER KIT SURGIFOAM (HEMOSTASIS) IMPLANT
KIT BASIN OR (CUSTOM PROCEDURE TRAY) ×4 IMPLANT
KIT INFUSE LRG II (Orthopedic Implant) ×4 IMPLANT
KIT POSITION SURG JACKSON T1 (MISCELLANEOUS) ×4 IMPLANT
KIT TURNOVER KIT B (KITS) ×4 IMPLANT
LOCKING CAP CREO THRD (Cap) ×16 IMPLANT
MARKER SPHERE PSV REFLC 13MM (MARKER) ×20 IMPLANT
MILL MEDIUM DISP (BLADE) ×4 IMPLANT
NEEDLE HYPO 21X1.5 SAFETY (NEEDLE) ×4 IMPLANT
NEEDLE HYPO 25X1 1.5 SAFETY (NEEDLE) ×4 IMPLANT
NS IRRIG 1000ML POUR BTL (IV SOLUTION) ×4 IMPLANT
OIL CARTRIDGE MAESTRO DRILL (MISCELLANEOUS) ×8
PACK AFFIRM FILLER DELIVERY (MISCELLANEOUS) ×4 IMPLANT
PACK LAMINECTOMY NEURO (CUSTOM PROCEDURE TRAY) ×4 IMPLANT
PAD ARMBOARD 7.5X6 YLW CONV (MISCELLANEOUS) ×20 IMPLANT
ROD SPINAL STRT 5.5X500 (Rod) ×8 IMPLANT
SCREW FENS PA THRD 5.5X50 (Screw) ×8 IMPLANT
SCREW FENS PA THRD 6.5X50 (Screw) ×8 IMPLANT
SCREW ROD THRD CREO 8.5X80 (Screw) ×8 IMPLANT
SPONGE SURGIFOAM ABS GEL 100 (HEMOSTASIS) ×4 IMPLANT
STRIP CLOSURE SKIN 1/2X4 (GAUZE/BANDAGES/DRESSINGS) ×6 IMPLANT
SUT VIC AB 0 CT1 18XCR BRD8 (SUTURE) ×8 IMPLANT
SUT VIC AB 0 CT1 8-18 (SUTURE) ×8
SUT VIC AB 2-0 CT1 18 (SUTURE) ×12 IMPLANT
SUT VIC AB 4-0 PS2 27 (SUTURE) ×8 IMPLANT
SYR 20CC LL (SYRINGE) ×8 IMPLANT
SYR CONTROL 10ML LL (SYRINGE) ×4 IMPLANT
SYRINGE 20CC LL (MISCELLANEOUS) ×4 IMPLANT
TOWEL GREEN STERILE (TOWEL DISPOSABLE) ×4 IMPLANT
TOWEL GREEN STERILE FF (TOWEL DISPOSABLE) ×4 IMPLANT
TRAY FOLEY MTR SLVR 16FR STAT (SET/KITS/TRAYS/PACK) ×4 IMPLANT
WATER STERILE IRR 1000ML POUR (IV SOLUTION) ×4 IMPLANT

## 2018-08-21 NOTE — Anesthesia Procedure Notes (Signed)
Arterial Line Insertion Start/End10/28/2019 7:10 AM, 08/21/2018 7:20 AM Performed by: Lance Coon, CRNA, CRNA  Patient location: OR. Preanesthetic checklist: patient identified, IV checked, site marked, risks and benefits discussed, surgical consent, monitors and equipment checked, pre-op evaluation, timeout performed and anesthesia consent Lidocaine 1% used for infiltration Left, radial was placed Catheter size: 20 G Hand hygiene performed , maximum sterile barriers used  and Seldinger technique used  Attempts: 1 Procedure performed without using ultrasound guided technique. Following insertion, dressing applied and Biopatch. Post procedure assessment: normal  Patient tolerated the procedure well with no immediate complications.

## 2018-08-21 NOTE — Progress Notes (Signed)
Pharmacy Antibiotic Note  Michael Barr is a 72 y.o. male admitted on 08/21/2018 with surgical prophylaxis.  Pharmacy has been consulted for vancomycin dosing.  Underwent thoracolumbar revision surgery with placement of iliac screws, fixation and revision and replacement of thoracic screws on 10/28. Patient does have drain in place. Scr on 10/21 was 1.17 (normCrCl ~60 mL/min). AET 1517 on 10/28.   Plan: Vancomycin 1000 IV every 12 hours.  Goal trough 10-15 mcg/mL.  Monitor length of therapy for VT as appropriate, renal fx    Temp (24hrs), Avg:98.2 F (36.8 C), Min:97.9 F (36.6 C), Max:99.1 F (37.3 C)  No results for input(s): WBC, CREATININE, LATICACIDVEN, VANCOTROUGH, VANCOPEAK, VANCORANDOM, GENTTROUGH, GENTPEAK, GENTRANDOM, TOBRATROUGH, TOBRAPEAK, TOBRARND, AMIKACINPEAK, AMIKACINTROU, AMIKACIN in the last 168 hours.  Estimated Creatinine Clearance: 76.4 mL/min (by C-G formula based on SCr of 1.17 mg/dL).    Allergies  Allergen Reactions  . Penicillins Rash and Other (See Comments)    Has patient had a PCN reaction causing immediate rash, facial/tongue/throat swelling, SOB or lightheadedness with hypotension: UNSPECIFIED  Has patient had a PCN reaction causing severe rash involving mucus membranes or skin necrosis: UNSPECIFIED  Has patient had a PCN reaction that required hospitalization UNSPECIFIED  Has patient had a PCN reaction occurring within the last 10 years: UNSPECIFIED  If all of the above answers are "NO", then may proceed with Cephalosporin use.    Thank you for allowing pharmacy to be a part of this patient's care.  Doylene Canard, PharmD Clinical Pharmacist  Pager: [phone] Phone: [phone] 08/21/2018 6:08 PM

## 2018-08-21 NOTE — Transfer of Care (Signed)
Immediate Anesthesia Transfer of Care Note  Patient: Michael Barr  Procedure(s) Performed: Posterior Lateral Fusion Thoracic Eight to Pelvis with Iliac screws and Fenestrated screws with kyphon and AIRO (N/A Spine Lumbar) APPLICATION OF INTRAOPERATIVE CT SCAN (N/A )  Patient Location: PACU  Anesthesia Type:General  Level of Consciousness: awake and patient cooperative  Airway & Oxygen Therapy: Patient Spontanous Breathing and Patient connected to face mask oxygen  Post-op Assessment: Report given to RN and Post -op Vital signs reviewed and stable  Post vital signs: Reviewed and stable  Last Vitals:  Vitals Value Taken Time  BP 132/99 08/21/2018  3:12 PM  Temp    Pulse 108 08/21/2018  3:16 PM  Resp 19 08/21/2018  3:16 PM  SpO2 100 % 08/21/2018  3:16 PM  Vitals shown include unvalidated device data.  Last Pain:  Vitals:   08/21/18 0633  TempSrc:   PainSc: 5          Complications: No apparent anesthesia complications

## 2018-08-21 NOTE — H&P (Signed)
Michael Barr is an 72 y.o. male.   Chief Complaint: back pain and right hip and leg pain HPI: 72 year old male has had multiple back operations most recently which was a T10 L5 however over the last several months he developed progressive worsening back pain and primarily right hip and leg pain occasionally down to his right foot. Follow-up imaging has shown pseudoarthrosis and loosening of his upper thoracic screws as well as displacement and pulling his rod out of his inferior L5 screws. Patient failed all forms of conservative treatment due to his progression of disease on the inferior aspect of his lumbar spine around L5-S1 as well as pseudoarthrosis and hardware failure, I recommended reexploration of thoracic and lumbar fusion with revision of fusion with replacement thoracic screws with fenestrated screws utilizing Kyphon to inject through the screws as well as placement of bilateral iliac screws and tying into his construct. I've extensively gone over the risks and benefits of this procedure with him as well as perioperative course expectations of outcome and alternatives of surgery and he understands and agrees to proceed forward.  Past Medical History:  Diagnosis Date  . Anxiety    takes Xanax and Methadone daily  . Arthritis    low back- DDD  . Asthma 70's  . Cataract    immature and to the left eye  . Chronic back pain    HNP  . Constipation    takes Sennokot nightly  . Coronary artery disease   . Depression    attacks  . Fibromyalgia   . GERD (gastroesophageal reflux disease)    20 YRS AGO, AND DIET CONTROL  . History of kidney stones   . Hyperlipidemia    takes Zocor daily  . Hypertension    takes Zestoretic daily  . Hypokalemia    takes Potassium daily  . Insomnia    takes trazodone nightly  . Joint pain   . Joint swelling   . Nocturia   . Pneumonia 70's  . Weakness    right leg    Past Surgical History:  Procedure Laterality Date  . BACK SURGERY     multiple back surgeries  . CARDIAC CATHETERIZATION     unsure of how long ago  . CARPAL TUNNEL RELEASE Bilateral 70's  . CHOLECYSTECTOMY  1985  . COLONOSCOPY    . CORONARY ARTERY BYPASS GRAFT  1996   1 vessel  . HARDWARE REMOVAL N/A 03/26/2016   Procedure: HARDWARE REMOVAL Lumbar one-two ;  Surgeon: Kary Kos, MD;  Location: [facility];  Service: Neurosurgery;  Laterality: N/A;  . JOINT REPLACEMENT  1999   lt knee  . KNEE ARTHROSCOPY Right   . LUMBAR LAMINECTOMY/DECOMPRESSION MICRODISCECTOMY Right 09/26/2013   Procedure: LUMBAR LAMINECTOMY/DECOMPRESSION MICRODISCECTOMY RIGHT THREE-FOUR;  Surgeon: Elaina Hoops, MD;  Location: [facility];  Service: Neurosurgery;  Laterality: Right;  . pilondial cyst     x 2   . SHOULDER ARTHROSCOPY Bilateral    left x 2 and right x 1  . TONSILLECTOMY    . tumor removed from right ankle   70's    History reviewed. No pertinent family history. Social History:  reports that he has quit smoking. He has never used smokeless tobacco. He reports that he does not drink alcohol or use drugs.  Allergies:  Allergies  Allergen Reactions  . Penicillins Rash and Other (See Comments)    Has patient had a PCN reaction causing immediate rash, facial/tongue/throat swelling, SOB or lightheadedness with hypotension:  UNSPECIFIED  Has patient had a PCN reaction causing severe rash involving mucus membranes or skin necrosis: UNSPECIFIED  Has patient had a PCN reaction that required hospitalization UNSPECIFIED  Has patient had a PCN reaction occurring within the last 10 years: UNSPECIFIED  If all of the above answers are "NO", then may proceed with Cephalosporin use.    Medications Prior to Admission  Medication Sig Dispense Refill  . acetaminophen (TYLENOL) 500 MG tablet Take 500 mg by mouth 3 (three) times daily.     Marland Kitchen ALPRAZolam (XANAX) 0.5 MG tablet Take 0.25 mg by mouth 3 (three) times daily.     Marland Kitchen aspirin EC 325 MG tablet Take 325 mg by mouth daily.    .  diphenhydrAMINE (BENADRYL) 25 mg capsule Take 25 mg by mouth at bedtime as needed for itching or allergies.    Marland Kitchen lisinopril-hydrochlorothiazide (PRINZIDE,ZESTORETIC) 20-25 MG tablet Take 1 tablet by mouth every morning. 90 tablet 1  . methadone (DOLOPHINE) 10 MG tablet Take 1 tablet (10 mg total) by mouth every 12 (twelve) hours. (Patient taking differently: Take 5 mg by mouth 3 (three) times daily. ) 30 tablet 0  . Multiple Vitamins-Minerals (MULTIVITAMIN WITH MINERALS) tablet Take 1 tablet by mouth daily.    . nitroGLYCERIN (NITROSTAT) 0.4 MG SL tablet Place 0.4 mg under the tongue every 5 (five) minutes as needed for chest pain.    . Oxycodone HCl 10 MG TABS Take 10 mg by mouth 3 (three) times daily.    . Potassium 99 MG TABS Take 99 mg by mouth daily.    Marland Kitchen senna (SENOKOT) 8.6 MG TABS tablet Take 2 tablets by mouth at bedtime.    . simvastatin (ZOCOR) 40 MG tablet Take 1 tablet (40 mg total) by mouth at bedtime. 90 tablet 1  . traZODone (DESYREL) 100 MG tablet Take 250 mg by mouth at bedtime.       No results found for this or any previous visit (from the past 48 hour(s)). No results found.  Review of Systems  Musculoskeletal: Positive for back pain, joint pain and myalgias.  Neurological: Positive for sensory change.    Blood pressure (!) 119/59, pulse 66, temperature 97.9 F (36.6 C), temperature source Oral, resp. rate 20, SpO2 95 %. Physical Exam  Constitutional: He is oriented to person, place, and time. He appears well-developed and well-nourished.  HENT:  Head: Normocephalic.  Eyes: Pupils are equal, round, and reactive to light.  Neck: Normal range of motion.  Respiratory: Effort normal.  GI: Soft.  Musculoskeletal: Normal range of motion.  Neurological: He is alert and oriented to person, place, and time. He has normal strength. GCS eye subscore is 4. GCS verbal subscore is 5. GCS motor subscore is 6.  Strength out of 5 iliopsoas, quads, hamstrings, gastric, into tibialis,  EHL.     Assessment/Plan 72-year-old gentleman presents for thoracolumbar revision surgery with placement of iliac screws, fixation and revision and replacement of thoracic screws.  Chico Cawood P, MD 08/21/2018, 7:18 AM

## 2018-08-21 NOTE — Anesthesia Postprocedure Evaluation (Signed)
Anesthesia Post Note  Patient: Debbie Bellucci  Procedure(s) Performed: Posterior Lateral Fusion Thoracic Eight to Pelvis with Iliac screws and Fenestrated screws with kyphon and AIRO (N/A Spine Lumbar) APPLICATION OF INTRAOPERATIVE CT SCAN (N/A )     Patient location during evaluation: PACU Anesthesia Type: General Level of consciousness: awake and alert Pain management: pain level controlled Vital Signs Assessment: post-procedure vital signs reviewed and stable Respiratory status: spontaneous breathing, nonlabored ventilation and respiratory function stable Cardiovascular status: blood pressure returned to baseline and stable Postop Assessment: no apparent nausea or vomiting Anesthetic complications: no    Last Vitals:  Vitals:   08/21/18 1749 08/21/18 1948  BP: (!) 150/70 116/64  Pulse: (!) 101 95  Resp:  20  Temp: 37.3 C 37.2 C  SpO2: 94% 90%    Last Pain:  Vitals:   08/21/18 2025  TempSrc:   PainSc: [place]

## 2018-08-21 NOTE — Anesthesia Procedure Notes (Signed)
Procedure Name: Intubation Date/Time: 08/21/2018 7:49 AM Performed by: Lance Coon, CRNA Pre-anesthesia Checklist: Patient identified, Emergency Drugs available, Suction available, Patient being monitored and Timeout performed Patient Re-evaluated:Patient Re-evaluated prior to induction Oxygen Delivery Method: Circle system utilized Preoxygenation: Pre-oxygenation with 100% oxygen Induction Type: IV induction Ventilation: Mask ventilation without difficulty and Oral airway inserted - appropriate to patient size Laryngoscope Size: 4 and Glidescope Grade View: Grade I Tube type: Oral Tube size: 7.5 mm Number of attempts: 1 Airway Equipment and Method: Stylet Placement Confirmation: ETT inserted through vocal cords under direct vision,  positive ETCO2 and breath sounds checked- equal and bilateral Secured at: 21 cm Tube secured with: Tape Dental Injury: Teeth and Oropharynx as per pre-operative assessment

## 2018-08-21 NOTE — Progress Notes (Signed)
Patient admit to room 14 from PACU. Patient alert oriented times 4, c/o pain 5/10 from his back surgery. VS stable, patient in bed locked at lowest position, call light, bedside table and telephone within patient reach. Family at bedside. Will continue to monitor patient. Report received from [place], [place].

## 2018-08-21 NOTE — Op Note (Signed)
Diagnosis: Pseudoarthrosis T10-11 and T11-12, and L4-5  Postoperative diagnosis: Same  Procedure: #1 exploration of fusion removal of hardware T10-L5with removal of the knots removal of the rods and removal of loose T10 and T11 pedicle screws  #2 thoraco- lumbar revision of fusion from T9 to the ilium with pelvic fixation with placement of globus fenestrated screws at T9 bilaterally T11 bilaterally with injection of Kyphon through the fenestrated screws. Placement bilateral iliac screws utilizing stereotactic navigation system for iliac screw placement as well as placement of the new T9 pedicle screws.  #3 posterior lateral arthrodesis T9 to the ilium utilizing locally harvested autograft from preparation for iliac screws mixed withvivgen BMP and cortical fiber boats  Surgeon: Dominica Severin Terrianne Cavness  Asst.: Deatra Ina  Anesthesia: Gen.  EBL: Minimal  History of present illness: 72 year old gentleman who previously undergone multiple operations most recent of which was a T10 to L1 extension and patient is noted progress worsening back and bilateral hip and leg pain worse on the right. Workup revealed loosening of his steel 10 and T11 screws as well as displacement of his rod superior to his L5 screw and pseudoarthrosis at L4-5. Due to his failure conservative treatment progression of conchal syndrome imaging findings and recommended revision of fusion surgery with removal of his hardware replacement of the loose screws extension up to T9 replacement of bilateral iliac screws and pelvic 6 sedation tying at all into his construct up to T9. I've extensively gone over the risks and benefits of the operation with him as well as perioperative course expectations of outcome and alternatives of surgery and he understands and agrees to proceed forward.  Operative procedure: Patient brought into the or was induced under general anesthesia positioned prone on Jackson table his back was prepped and draped in  routine sterile fashion is old incision was opened up and extended slightly cephalad caudally I took down to the scar tissue exposed the old hardware from T10 down to L5 and exposed the laminar complexes at T7-T8 and T9 as well as I exposed the L5-S1 disc space and the posterior superior iliac spine of the ilium through a separate fascial incision. At this point utilizing the stereotactic navigation system we performed a interoperative CT and through navigation I selected an entry point over the posterior superior iliac spine after biting down proximally a centimeter and a half of the iliac spine and navigated 2 iliac bolts. I placed 80 mm 8.5 screws in the ilium bilaterally. At this point I didn't tensions and taken to the T9 pedicle screws in a similar fashion I navigated T9 utilizing 45 tap and 5 5 x 50 mm screws. I replaced the 2 loose screws that I removed at T11 with one size bigger I pulled out 5.5 x 40 and replaced and was 6.5 x 50 pedicle screws. The screws were put in T9 and T11 were all fenestrated. The remainder the fusion below T11 appear to be solid all way down to L4 there was a pseudoarthrosis at L4-5. The screws were not loose at L5. So I left those behind. Then at this point utilizing the Kyphon system I injected a cc and a half of Kyphon cement through the fenestrated screws. Then the wound was copious irrigated meticulous he states was maintained I aggressively decorticated the TPs and lateral gutters from T9 all the way down to the ilium across the L5-S1 facet joint and posterior laterally to L4-5 as well. Then after aggressive decortication was carried out at packed the  local autograft mixed along with the beverage and BMP and cortical fiber boats posterior laterally from T9 all way down to the ilium bilaterally. Then fashion 2 rods anchored everything in place placed a cross-link speckled the wound and vancomycin powder posterior fluoroscopy confirmed good position of all the implants. I then  made sure everything was tightened in place placed a large Hemovac drain injected X Braun the fascia and closed the wound in layers with interrupted Vicryl and a running 4 subcuticular or bun benzoin Steri-Strips and a sterile dressing was applied patient recovered in stable condition. At the end the case all needle counts sponge counts were correct.

## 2018-08-22 ENCOUNTER — Encounter (HOSPITAL_COMMUNITY): Payer: Self-pay | Admitting: Neurosurgery

## 2018-08-22 LAB — BASIC METABOLIC PANEL
Anion gap: 8 (ref 5–15)
BUN: 15 mg/dL (ref 8–23)
CHLORIDE: 105 mmol/L (ref 98–111)
CO2: 24 mmol/L (ref 22–32)
Calcium: 8.4 mg/dL — ABNORMAL LOW (ref 8.9–10.3)
Creatinine, Ser: 1.05 mg/dL (ref 0.61–1.24)
GFR calc Af Amer: >60 mL/min (ref >60)
GLUCOSE: 114 mg/dL — AB (ref 70–99)
POTASSIUM: 4.1 mmol/L (ref 3.5–5.1)
Sodium: 137 mmol/L (ref 135–145)

## 2018-08-22 MED ORDER — DEXAMETHASONE SODIUM PHOSPHATE 10 MG/ML IJ SOLN
10.0000 mg | Freq: Four times a day (QID) | INTRAMUSCULAR | Status: DC
  Administered 2018-08-22 – 2018-08-24 (×10): 10 mg via INTRAVENOUS
  Filled 2018-08-22 (×10): qty 1

## 2018-08-22 MED ORDER — GABAPENTIN 300 MG PO CAPS
300.0000 mg | ORAL_CAPSULE | Freq: Three times a day (TID) | ORAL | Status: DC
  Administered 2018-08-22 – 2018-08-25 (×10): 300 mg via ORAL
  Filled 2018-08-22 (×10): qty 1

## 2018-08-22 MED FILL — Thrombin (Recombinant) For Soln 20000 Unit: CUTANEOUS | Qty: 1 | Status: AC

## 2018-08-22 MED FILL — Heparin Sodium (Porcine) Inj 1000 Unit/ML: INTRAMUSCULAR | Qty: 30 | Status: AC

## 2018-08-22 MED FILL — Sodium Chloride IV Soln 0.9%: INTRAVENOUS | Qty: 1000 | Status: AC

## 2018-08-22 NOTE — Evaluation (Signed)
Occupational Therapy Evaluation Patient Details Name: Michael Barr MRN: [id number] DOB: 01-Apr-1946 Today's Date: 08/22/2018    History of Present Illness 72 yo admitted for removal of hardware T10-L5 with T9 to ilium fusion. PMHx: anxiety, asthma, cataract, CAD, depression, HTN, HLD, L TKa, lami and fusion   Clinical Impression   Patient is s/p T10-L5 with T9 ilium fusion surgery resulting in functional limitations due to the deficits listed below (see OT problem list). Pt currently requires max (A) for LB and has AE at home from previous surg. OT to focus on bed mobility and tub transfer next session. Pt and wife plan to sponge bath initially and then progress to standing in the shower. Patient will benefit from skilled OT acutely to increase independence and safety with ADLS to allow discharge home.     Follow Up Recommendations  No OT follow up    Equipment Recommendations  None recommended by OT    Recommendations for Other Services       Precautions / Restrictions Precautions Precautions: Back;Fall Precaution Booklet Issued: Yes (comment) Required Braces or Orthoses: Spinal Brace Spinal Brace: Lumbar corset;Applied in sitting position Restrictions Weight Bearing Restrictions: No      Mobility Bed Mobility Overal bed mobility: Modified Independent             General bed mobility comments: completed with PT. pt reports requiring the bed rail and bed being slightly higher in height. Pt encouraged to complete bed mobility here with staff to simulate home setup.   Transfers Overall transfer level: Needs assistance   Transfers: Sit to/from Stand Sit to Stand: Supervision         General transfer comment: cues for hand placement and posture    Balance Overall balance assessment: Needs assistance   Sitting balance-Leahy Scale: Good       Standing balance-Leahy Scale: Fair                             ADL either performed or assessed with  clinical judgement   ADL Overall ADL's : Needs assistance/impaired Eating/Feeding: Independent   Grooming: Wash/dry hands;Wash/dry face;Sitting;Modified independent   Upper Body Bathing: Modified independent;Sitting   Lower Body Bathing: Maximal assistance;Sitting/lateral leans   Upper Body Dressing : Minimal assistance;Sitting   Lower Body Dressing: Maximal assistance;Sit to/from stand                 General ADL Comments: Pt and wife with good recall of all precautions. pt with previous back surgery and abl eto verbalized precautions. pt has wife (A) 24/7 upon d/c . wife very active in this session to assist patient   Back handout provided and reviewed adls in detail. Pt educated on: clothing between brace, never sleep in brace, avoid sitting for long periods of time, correct bed positioning for sleeping, avoiding lifting more than 5 pounds and never wash directly over incision.   Vision         Perception     Praxis      Pertinent Vitals/Pain Pain Assessment: 0-10 Pain Score: 5  Pain Location: left lateral leg Pain Descriptors / Indicators: Aching;Burning Pain Intervention(s): Monitored during session;Repositioned;Other (comment)(RN arriving at the end of session with medication)     Hand Dominance Right   Extremity/Trunk Assessment Upper Extremity Assessment Upper Extremity Assessment: Overall WFL for tasks assessed   Lower Extremity Assessment Lower Extremity Assessment: Defer to PT evaluation   Cervical / Trunk Assessment Cervical /  Trunk Assessment: Kyphotic   Communication Communication Communication: HOH   Cognition Arousal/Alertness: Awake/alert Behavior During Therapy: WFL for tasks assessed/performed Overall Cognitive Status: Within Functional Limits for tasks assessed                                     General Comments  pt with back brace from previous surg and don at this time. pt with good recall of don doff/ wear schedule.      Exercises     Shoulder Instructions      Home Living Family/patient expects to be discharged to:: Private residence Living Arrangements: Spouse/significant other Available Help at Discharge: Family;Available 24 hours/day Type of Home: House Home Access: Stairs to enter [employer] of Steps: 2 Entrance Stairs-Rails: Right Home Layout: One level;Laundry or work area in basement     [employer] Shower/Tub: Hospital doctor Toilet: Handicapped [place]: Environmental consultant - 4 wheels;Cane - single point;Bedside commode;Shower seat;Adaptive equipment Adaptive Equipment: Reacher;Sock aid;Other (Comment)(toilet tongs) Additional Comments: sink beside toilet      Prior Functioning/Environment Level of Independence: Needs assistance  Gait / Transfers Assistance Needed: able to stand and drive, rollator for longer distances even home length ADL's / Homemaking Assistance Needed: wife does the homemaking and assists with bathing. wife helps with LB dressing but pt also uses AE at times            OT Problem List: Decreased strength;Decreased activity tolerance;Impaired balance (sitting and/or standing);Decreased safety awareness;Decreased knowledge of precautions;Decreased knowledge of use of DME or AE;Obesity;Pain;Cardiopulmonary status limiting activity      OT Treatment/Interventions: Self-care/ADL training;Therapeutic exercise;Energy conservation;DME and/or AE instruction;Manual therapy;Modalities;Therapeutic activities;Balance training;Patient/family education    OT Goals(Current goals can be found in the care plan section) Acute Rehab OT Goals Patient Stated Goal: to get the same PT i had before he was great OT Goal Formulation: With patient/family Time For Goal Achievement: 09/05/18 Potential to Achieve Goals: Good  OT Frequency: Min 2X/week   Barriers to D/C:            Co-evaluation              AM-PAC PT "6 Clicks" Daily Activity      Outcome Measure Help from another person eating meals?: None Help from another person taking care of personal grooming?: None Help from another person toileting, which includes using toliet, bedpan, or urinal?: A Little Help from another person bathing (including washing, rinsing, drying)?: A Lot Help from another person to put on and taking off regular upper body clothing?: A Little Help from another person to put on and taking off regular lower body clothing?: A Lot 6 Click Score: 18   End of Session Equipment Utilized During Treatment: Back brace Nurse Communication: Mobility status;Precautions  Activity Tolerance: Patient limited by pain Patient left: in chair;with call bell/phone within reach;with nursing/sitter in room;with family/visitor present  OT Visit Diagnosis: Muscle weakness (generalized) (M62.81)                Time: 4098-1191 OT Time Calculation (min): 22 min Charges:  OT General Charges $OT Visit: 1 Visit OT Evaluation $OT Eval Moderate Complexity: 1 Mod   Jeri Modena, OTR/L  Acute Rehabilitation Services Pager: [phone] Office: [phone] .   Parke Poisson B 08/22/2018, 8:29 AM

## 2018-08-22 NOTE — Progress Notes (Signed)
Subjective: Patient reports patient doing okay still a lot of back and right hip and leg pain consistent with L5 radiculitis  Objective: Vital signs in last 24 hours: Temp:  [97.9 F (36.6 C)-99.1 F (37.3 C)] 98.2 F (36.8 C) (10/29 0300) Pulse Rate:  [74-119] 76 (10/29 0300) Resp:  [8-21] 16 (10/29 0300) BP: (116-167)/(59-99) 121/59 (10/29 0300) SpO2:  [90 %-100 %] 100 % (10/29 0300) Arterial Line BP: (86-166)/(36-93) 145/65 (10/28 1545)  Intake/Output from previous day: 10/28 0701 - 10/29 0700 In: 3873.6 [I.V.:3173.5; IV Piggyback:700.1] Out: 2774 [Urine:2175; Drains:610; Blood:400] Intake/Output this shift: No intake/output data recorded.  progressive mobilization today with physical occupational therapy patient was angling this morning.  Lab Results: No results for input(s): WBC, HGB, HCT, PLT in the last 72 hours. BMET No results for input(s): NA, K, CL, CO2, GLUCOSE, BUN, CREATININE, CALCIUM in the last 72 hours.  Studies/Results: Dg Thoracolumabar Spine  Result Date: 08/21/2018 CLINICAL DATA:  71 year old male undergoing cephalad extension of spinal fusion. EXAM: THORACOLUMBAR SPINE 1V COMPARISON:  CT thoracic and lumbar spine 06/23/2018, and earlier. FLUOROSCOPY TIME:  0 minutes 13 seconds FINDINGS: Seven intraoperative fluoroscopic spot views of the spine in the lateral and AP projections. Bilateral iliac bone screws have been placed, and connecting rods have been extended inferiorly. The cephalad level of extension of spinal fusion is unclear based on the provided images. IMPRESSION: Cephalad and caudal extension of thoracolumbar spinal fusion. Electronically Signed   By: Genevie Ann M.D.   On: 08/21/2018 16:36   Dg C-arm 1-60 Min  Result Date: 08/21/2018 CLINICAL DATA:  72 year old male undergoing cephalad extension of spinal fusion. EXAM: THORACOLUMBAR SPINE 1V COMPARISON:  CT thoracic and lumbar spine 06/23/2018, and earlier. FLUOROSCOPY TIME:  0 minutes 13 seconds  FINDINGS: Seven intraoperative fluoroscopic spot views of the spine in the lateral and AP projections. Bilateral iliac bone screws have been placed, and connecting rods have been extended inferiorly. The cephalad level of extension of spinal fusion is unclear based on the provided images. IMPRESSION: Cephalad and caudal extension of thoracolumbar spinal fusion. Electronically Signed   By: Genevie Ann M.D.   On: 08/21/2018 16:36    Assessment/Plan: Posterior day 1 T9 to the ilium revision surgery with fair amount of postoperative back pain and right L5 radiculitis will start him on Decadron and gabapentin continue to work with physical and occupational therapy.  LOS: 1 day     Michael Barr P 08/22/2018, 7:56 AM

## 2018-08-22 NOTE — Evaluation (Signed)
Physical Therapy Evaluation Patient Details Name: Michael Barr MRN: [id number] DOB: Mar 25, 1946 Today's Date: 08/22/2018   History of Present Illness  72 yo admitted for removal of hardware T10-L5 with T9 to ilium fusion. PMHx: anxiety, asthma, cataract, CAD, depression, HTN, HLD, L TKa, lami and fusion  Clinical Impression  Pt pleasant and reports eager to get OOB. Pt able to don brace EOB with supervision and able to walk in hall but maintains flexed hips. Pt with decreased activity tolerance, RLE pain, decreased strength and function who will benefit from acute therapy to maximize mobility and function to decrease burden of care. Pt educated for all back precautions with handout provided. Wife present throughout session and able to assist at home.      Follow Up Recommendations Home health PT    Equipment Recommendations  None recommended by PT    Recommendations for Other Services       Precautions / Restrictions Precautions Precautions: Back;Fall Precaution Booklet Issued: Yes (comment) Required Braces or Orthoses: Spinal Brace Spinal Brace: Lumbar corset;Applied in sitting position      Mobility  Bed Mobility Overal bed mobility: Modified Independent             General bed mobility comments: increased time to roll and rise with good technique with bed flat  Transfers Overall transfer level: Needs assistance   Transfers: Sit to/from Stand Sit to Stand: Supervision         General transfer comment: cues for hand placement and posture  Ambulation/Gait Ambulation/Gait assistance: Min guard Gait Distance (Feet): 90 Feet Assistive device: Rolling walker (2 wheeled) Gait Pattern/deviations: Step-through pattern;Decreased stride length;Trunk flexed   Gait velocity interpretation: 1.31 - 2.62 ft/sec, indicative of limited community ambulator General Gait Details: cues for posture and position in RW  Stairs            Wheelchair Mobility     Modified Rankin (Stroke Patients Only)       Balance Overall balance assessment: Needs assistance   Sitting balance-Leahy Scale: Good       Standing balance-Leahy Scale: Fair                               Pertinent Vitals/Pain Pain Assessment: 0-10 Pain Score: 6  Pain Location: left lateral leg Pain Descriptors / Indicators: Aching;Burning Pain Intervention(s): Limited activity within patient's tolerance;Repositioned;Monitored during session;Patient requesting pain meds-RN notified;Premedicated before session    Home Living Family/patient expects to be discharged to:: Private residence Living Arrangements: Spouse/significant other Available Help at Discharge: Family;Available 24 hours/day Type of Home: House Home Access: Stairs to enter Entrance Stairs-Rails: Right Entrance Stairs-Number of Steps: 2 Home Layout: One level;Laundry or work area in [place]: Environmental consultant - 4 wheels;Cane - single point;Bedside commode;Shower seat Additional Comments: sink beside toilet    Prior Function Level of Independence: Needs assistance   Gait / Transfers Assistance Needed: able to stand and drive, rollator for longer distances even home length  ADL's / Homemaking Assistance Needed: wife does the homemaking and assists with bathing        Hand Dominance        Extremity/Trunk Assessment   Upper Extremity Assessment Upper Extremity Assessment: Defer to OT evaluation    Lower Extremity Assessment Lower Extremity Assessment: Generalized weakness    Cervical / Trunk Assessment Cervical / Trunk Assessment: Kyphotic  Communication   Communication: HOH  Cognition Arousal/Alertness: Awake/alert Behavior During Therapy: WFL for tasks  assessed/performed Overall Cognitive Status: Within Functional Limits for tasks assessed                                        General Comments      Exercises     Assessment/Plan    PT  Assessment Patient needs continued PT services  PT Problem List Decreased strength;Decreased mobility;Decreased activity tolerance;Decreased balance;Decreased knowledge of use of DME;Pain       PT Treatment Interventions DME instruction;Functional mobility training;Balance training;Patient/family education;Gait training;Therapeutic activities;Stair training;Therapeutic exercise    PT Goals (Current goals can be found in the Care Plan section)  Acute Rehab PT Goals Patient Stated Goal: return home, play guitar PT Goal Formulation: With patient/family Time For Goal Achievement: 09/05/18 Potential to Achieve Goals: Good    Frequency Min 5X/week   Barriers to discharge        Co-evaluation               AM-PAC PT "6 Clicks" Daily Activity  Outcome Measure Difficulty turning over in bed (including adjusting bedclothes, sheets and blankets)?: A Little Difficulty moving from lying on back to sitting on the side of the bed? : A Little Difficulty sitting down on and standing up from a chair with arms (e.g., wheelchair, bedside commode, etc,.)?: A Little Help needed moving to and from a bed to chair (including a wheelchair)?: A Little Help needed walking in hospital room?: A Little Help needed climbing 3-5 steps with a railing? : A Little 6 Click Score: 18    End of Session Equipment Utilized During Treatment: Back brace Activity Tolerance: Patient limited by pain Patient left: in chair;with call bell/phone within reach;with family/visitor present Nurse Communication: Mobility status;Precautions PT Visit Diagnosis: Other abnormalities of gait and mobility (R26.89);Muscle weakness (generalized) (M62.81)    Time: 2956-2130 PT Time Calculation (min) (ACUTE ONLY): 28 min   Charges:   PT Evaluation $PT Eval Moderate Complexity: 1 Mod PT Treatments $Therapeutic Activity: 8-22 mins        Michael Barr Michael Barr, PT Acute Rehabilitation Services Pager: [phone] Office:  [phone]   Michael Barr 08/22/2018, 8:05 AM

## 2018-08-22 NOTE — Social Work (Signed)
CSW acknowledging consult for SNF placement, pt recommended for [facility] therapies.  CSW signing off. Please consult if any additional needs arise.  Alexander Mt, [place] Work [phone]

## 2018-08-23 NOTE — Progress Notes (Signed)
When emptying pt hemovac drain to record output about 5cc of sanguinous drainage was spilled out of the hemovac onto the pt bed. Pt refused a linen change stating it will get fixed once he gets a bath later.

## 2018-08-23 NOTE — Progress Notes (Signed)
Physical Therapy Treatment Patient Details Name: Michael Barr MRN: [id number] DOB: 04/14/1946 Today's Date: 08/23/2018    History of Present Illness 72 yo admitted for removal of hardware T10-L5 with T9 to ilium fusion. PMHx: anxiety, asthma, cataract, CAD, depression, HTN, HLD, L TKa, lami and fusion    PT Comments    Pt reports "No on got me up and walked with me yesterday." Wife concurred, however PT eval note in chart from 730am. Reported this to pt and spouse however they cont to deny. Pt cont to have L LE pain limiting ambulation tolerance as well as sitting tolerance. Pt slowly progressing. Pt with good home set up and support. Acute PT to cont to follow.   Follow Up Recommendations  Home health PT     Equipment Recommendations  None recommended by PT    Recommendations for Other Services       Precautions / Restrictions Precautions Precautions: Back;Fall Precaution Booklet Issued: Yes (comment) Precaution Comments: pt with good recall Required Braces or Orthoses: Spinal Brace Spinal Brace: Lumbar corset;Applied in sitting position Restrictions Weight Bearing Restrictions: No    Mobility  Bed Mobility Overal bed mobility: Modified Independent             General bed mobility comments: HOB slightly elevated  Transfers Overall transfer level: Needs assistance Equipment used: Rolling walker (2 wheeled) Transfers: Sit to/from Stand Sit to Stand: Supervision         General transfer comment: verbal cues to push up from bed and minimize trunk flexion  Ambulation/Gait Ambulation/Gait assistance: Min guard Gait Distance (Feet): 110 Feet Assistive device: Rolling walker (2 wheeled) Gait Pattern/deviations: Step-through pattern;Decreased stride length;Trunk flexed   Gait velocity interpretation: 1.31 - 2.62 ft/sec, indicative of limited community ambulator General Gait Details: cues for posture and position in RW   Stairs              Wheelchair Mobility    Modified Rankin (Stroke Patients Only)       Balance Overall balance assessment: Needs assistance   Sitting balance-Leahy Scale: Good       Standing balance-Leahy Scale: Fair                              Cognition Arousal/Alertness: Awake/alert Behavior During Therapy: WFL for tasks assessed/performed Overall Cognitive Status: Impaired/Different from baseline Area of Impairment: Memory                     Memory: Decreased short-term memory         General Comments: both pt and wife stated that he stayed in bed all day and no one walked with him, however there is a PT EVAL note on 10/29.      Exercises      General Comments        Pertinent Vitals/Pain Pain Assessment: 0-10 Pain Score: 5  Pain Location: left lateral leg Pain Descriptors / Indicators: Aching;Burning Pain Intervention(s): Monitored during session    Home Living                      Prior Function            PT Goals (current goals can now be found in the care plan section) Progress towards PT goals: Progressing toward goals    Frequency    Min 5X/week      PT Plan Current plan remains appropriate  Co-evaluation              AM-PAC PT "6 Clicks" Daily Activity  Outcome Measure  Difficulty turning over in bed (including adjusting bedclothes, sheets and blankets)?: A Little Difficulty moving from lying on back to sitting on the side of the bed? : A Little Difficulty sitting down on and standing up from a chair with arms (e.g., wheelchair, bedside commode, etc,.)?: A Little Help needed moving to and from a bed to chair (including a wheelchair)?: A Little Help needed walking in hospital room?: A Little Help needed climbing 3-5 steps with a railing? : A Little 6 Click Score: 18    End of Session Equipment Utilized During Treatment: Back brace Activity Tolerance: Patient limited by pain Patient left: in chair;with  call bell/phone within reach;with family/visitor present Nurse Communication: Mobility status;Precautions PT Visit Diagnosis: Other abnormalities of gait and mobility (R26.89);Muscle weakness (generalized) (M62.81)     Time: 4536-4680 PT Time Calculation (min) (ACUTE ONLY): 22 min  Charges:  $Gait Training: 8-22 mins                     Kittie Plater, PT, DPT Acute Rehabilitation Services Pager #: [phone] Office #: [phone]    Berline Lopes 08/23/2018, 2:23 PM

## 2018-08-23 NOTE — Progress Notes (Signed)
Subjective: Patient reports doing slightly better less leg pain with less back pain  Objective: Vital signs in last 24 hours: Temp:  [97.6 F (36.4 C)-98.4 F (36.9 C)] 97.7 F (36.5 C) (10/30 0847) Pulse Rate:  [60-70] 63 (10/30 0847) Resp:  [16-17] 17 (10/29 1634) BP: (112-134)/(52-71) 134/65 (10/30 0847) SpO2:  [94 %-98 %] 96 % (10/30 0847)  Intake/Output from previous day: 10/29 0701 - 10/30 0700 In: 540 [P.O.:540] Out: 1785 [Urine:1525; Drains:260] Intake/Output this shift: No intake/output data recorded.  wound clean dry and intact strength out of 5  Lab Results: No results for input(s): WBC, HGB, HCT, PLT in the last 72 hours. BMET Recent Labs    08/22/18 0720  NA 137  K 4.1  CL 105  CO2 24  GLUCOSE 114*  BUN 15  CREATININE 1.05  CALCIUM 8.4*    Studies/Results: Dg Thoracolumabar Spine  Result Date: 08/21/2018 CLINICAL DATA:  72 year old male undergoing cephalad extension of spinal fusion. EXAM: THORACOLUMBAR SPINE 1V COMPARISON:  CT thoracic and lumbar spine 06/23/2018, and earlier. FLUOROSCOPY TIME:  0 minutes 13 seconds FINDINGS: Seven intraoperative fluoroscopic spot views of the spine in the lateral and AP projections. Bilateral iliac bone screws have been placed, and connecting rods have been extended inferiorly. The cephalad level of extension of spinal fusion is unclear based on the provided images. IMPRESSION: Cephalad and caudal extension of thoracolumbar spinal fusion. Electronically Signed   By: Genevie Ann M.D.   On: 08/21/2018 16:36   Dg C-arm 1-60 Min  Result Date: 08/21/2018 CLINICAL DATA:  72 year old male undergoing cephalad extension of spinal fusion. EXAM: THORACOLUMBAR SPINE 1V COMPARISON:  CT thoracic and lumbar spine 06/23/2018, and earlier. FLUOROSCOPY TIME:  0 minutes 13 seconds FINDINGS: Seven intraoperative fluoroscopic spot views of the spine in the lateral and AP projections. Bilateral iliac bone screws have been placed, and connecting  rods have been extended inferiorly. The cephalad level of extension of spinal fusion is unclear based on the provided images. IMPRESSION: Cephalad and caudal extension of thoracolumbar spinal fusion. Electronically Signed   By: Genevie Ann M.D.   On: 08/21/2018 16:36    Assessment/Plan: Mobilization with physical and occupational therapy we'll discontinue his Hemovac and redress his back.  LOS: 2 days     Rafal Archuleta P 08/23/2018, 9:14 AM

## 2018-08-24 ENCOUNTER — Inpatient Hospital Stay (HOSPITAL_COMMUNITY): Payer: Medicare Other

## 2018-08-24 LAB — BASIC METABOLIC PANEL
Anion gap: 12 (ref 5–15)
BUN: 22 mg/dL (ref 8–23)
CHLORIDE: 101 mmol/L (ref 98–111)
CO2: 25 mmol/L (ref 22–32)
Calcium: 8.6 mg/dL — ABNORMAL LOW (ref 8.9–10.3)
Creatinine, Ser: 1 mg/dL (ref 0.61–1.24)
GFR calc non Af Amer: >60 mL/min (ref >60)
Glucose, Bld: 178 mg/dL — ABNORMAL HIGH (ref 70–99)
POTASSIUM: 4.4 mmol/L (ref 3.5–5.1)
SODIUM: 138 mmol/L (ref 135–145)

## 2018-08-24 MED ORDER — DEXAMETHASONE SODIUM PHOSPHATE 10 MG/ML IJ SOLN
6.0000 mg | Freq: Four times a day (QID) | INTRAMUSCULAR | Status: DC
  Administered 2018-08-24 – 2018-08-25 (×3): 6 mg via INTRAVENOUS
  Filled 2018-08-24 (×3): qty 1

## 2018-08-24 NOTE — Progress Notes (Signed)
Pharmacy Antibiotic Note  Michael Barr is a 72 y.o. male admitted on 08/21/2018 with surgical prophylaxis.  Pharmacy has been consulted for vancomycin dosing.  Underwent thoracolumbar revision surgery with placement of iliac screws, fixation and revision and replacement of thoracic screws on 10/28. Patient had a drain in place but this has been removed. SCr remains stable.   Plan: Vancomycin 1000 IV every 12 hours.  Goal trough 10-15 mcg/mL.  Consider DC vancomycin since drain is removed    Temp (24hrs), Avg:97.9 F (36.6 C), Min:97.4 F (36.3 C), Max:98.7 F (37.1 C)  Recent Labs  Lab 08/22/18 0720 08/24/18 0650  CREATININE 1.05 1.00    Estimated Creatinine Clearance: 89.3 mL/min (by C-G formula based on SCr of 1 mg/dL).    Allergies  Allergen Reactions  . Penicillins Rash and Other (See Comments)    Has patient had a PCN reaction causing immediate rash, facial/tongue/throat swelling, SOB or lightheadedness with hypotension: UNSPECIFIED  Has patient had a PCN reaction causing severe rash involving mucus membranes or skin necrosis: UNSPECIFIED  Has patient had a PCN reaction that required hospitalization UNSPECIFIED  Has patient had a PCN reaction occurring within the last 10 years: UNSPECIFIED  If all of the above answers are "NO", then may proceed with Cephalosporin use.    Thank you for allowing pharmacy to be a part of this patient's care.  Salome Arnt, PharmD, BCPS Please see AMION for all pharmacy numbers 08/24/2018 9:03 AM

## 2018-08-24 NOTE — Progress Notes (Signed)
Dr. Saintclair Halsted currently at the bedside to see patient. Patient c/o of uncontrolled pain that radiates down his leg, plan is to order CT scan for later this am.

## 2018-08-24 NOTE — Plan of Care (Signed)

## 2018-08-24 NOTE — Progress Notes (Signed)
Physical Therapy Treatment Patient Details Name: Michael Barr MRN: [id number] DOB: June 24, 1946 Today's Date: 08/24/2018    History of Present Illness 72 yo admitted for removal of hardware T10-L5 with T9 to ilium fusion. PMHx: anxiety, asthma, cataract, CAD, depression, HTN, HLD, L TKa, lami and fusion    PT Comments    Pt pleasant and he and his wife report they recall this therapist but state he must have confused his dates during prior session. PT able to recall all precautions and needs cue to not bend during gait. PT able to increase gait distance and perform stairs today.     Follow Up Recommendations  Home health PT     Equipment Recommendations  None recommended by PT    Recommendations for Other Services       Precautions / Restrictions Precautions Precautions: Back;Fall Precaution Comments: pt with good recall Spinal Brace: Lumbar corset;Applied in sitting position    Mobility  Bed Mobility Overal bed mobility: Modified Independent             General bed mobility comments: HOB 15 degrees with increased time  Transfers Overall transfer level: Needs assistance   Transfers: Sit to/from Stand Sit to Stand: Supervision         General transfer comment: cues for hand placement and posture  Ambulation/Gait Ambulation/Gait assistance: Supervision Gait Distance (Feet): 200 Feet Assistive device: Rolling walker (2 wheeled) Gait Pattern/deviations: Step-through pattern;Decreased stride length;Trunk flexed   Gait velocity interpretation: >2.62 ft/sec, indicative of community ambulatory General Gait Details: cues for posture and position in RW   Stairs Stairs: Yes Stairs assistance: Modified independent (Device/Increase time) Stair Management: One rail Right;Step to pattern;Forwards Number of Stairs: 2     Wheelchair Mobility    Modified Rankin (Stroke Patients Only)       Balance Overall balance assessment: Needs assistance   Sitting  balance-Leahy Scale: Good       Standing balance-Leahy Scale: Fair                              Cognition Arousal/Alertness: Awake/alert Behavior During Therapy: WFL for tasks assessed/performed Overall Cognitive Status: Within Functional Limits for tasks assessed                                        Exercises      General Comments        Pertinent Vitals/Pain Pain Score: 3  Pain Location: back Pain Descriptors / Indicators: Aching Pain Intervention(s): Limited activity within patient's tolerance;Repositioned;Monitored during session;Premedicated before session    Home Living                      Prior Function            PT Goals (current goals can now be found in the care plan section) Progress towards PT goals: Progressing toward goals    Frequency           PT Plan Current plan remains appropriate    Co-evaluation              AM-PAC PT "6 Clicks" Daily Activity  Outcome Measure  Difficulty turning over in bed (including adjusting bedclothes, sheets and blankets)?: A Little Difficulty moving from lying on back to sitting on the side of the bed? : A Little Difficulty sitting down  on and standing up from a chair with arms (e.g., wheelchair, bedside commode, etc,.)?: A Little Help needed moving to and from a bed to chair (including a wheelchair)?: A Little Help needed walking in hospital room?: A Little Help needed climbing 3-5 steps with a railing? : None 6 Click Score: 19    End of Session Equipment Utilized During Treatment: Back brace Activity Tolerance: Patient tolerated treatment well Patient left: in chair;with call bell/phone within reach;with family/visitor present Nurse Communication: Mobility status;Precautions(time in chair) PT Visit Diagnosis: Other abnormalities of gait and mobility (R26.89);Muscle weakness (generalized) (M62.81)     Time: 4210-3128 PT Time Calculation (min) (ACUTE ONLY):  16 min  Charges:  $Gait Training: 8-22 mins                     Brielle, PT Acute Rehabilitation Services Pager: [phone] Office: [place] 08/24/2018, 9:04 AM

## 2018-08-24 NOTE — Progress Notes (Signed)
Subjective: Patient reports Patient making slow but steady progress slight decreased pain but still having a lot of right leg radicular symptoms  Objective: Vital signs in last 24 hours: Temp:  [97.6 F (36.4 C)-98.7 F (37.1 C)] 97.8 F (36.6 C) (10/31 1143) Pulse Rate:  [54-71] 66 (10/31 1143) BP: (110-132)/(50-65) 132/65 (10/31 1143) SpO2:  [94 %-97 %] 97 % (10/31 1143)  Intake/Output from previous day: 10/30 0701 - 10/31 0700 In: 240 [P.O.:240] Out: 565 [Urine:550; Drains:15] Intake/Output this shift: No intake/output data recorded.  Not able to examine the patient has he was in the restroom.  Lab Results: No results for input(s): WBC, HGB, HCT, PLT in the last 72 hours. BMET Recent Labs    08/22/18 0720 08/24/18 0650  NA 137 138  K 4.1 4.4  CL 105 101  CO2 24 25  GLUCOSE 114* 178*  BUN 15 22  CREATININE 1.05 1.00  CALCIUM 8.4* 8.6*    Studies/Results: Ct Lumbar Spine Wo Contrast  Result Date: 08/24/2018 CLINICAL DATA:  72 year old male postoperative day 3 from spine surgery with low back pain. EXAM: CT LUMBAR SPINE WITHOUT CONTRAST TECHNIQUE: Multidetector CT imaging of the lumbar spine was performed without intravenous contrast administration. Multiplanar CT image reconstructions were also generated. COMPARISON:  Intraoperative images [id number]. CT lumbar spine 06/23/2018. FINDINGS: Segmentation: Normal, the same numbering system used on the August CT. Alignment: Stable.  No spondylolisthesis. Vertebrae: Most of the T8 level is included, through the top of S3. Lower thoracic spinal fusion revision has occurred, with hardware removal at T10 and New pedicle screws placed at T9. See details below. No superimposed No acute osseous abnormality identified. Chronic anterior superior SI joint ankylosis. Paraspinal and other soft tissues: Postoperative changes to the posterior paraspinal soft tissues with no adverse features evident. Calcified aortic atherosclerosis. Prevertebral  soft tissues remain within normal limits. Negative visible costophrenic angles and noncontrast abdominal viscera. Disc levels: T8-T9: Evidence of interbody ankylosis via flowing anterior osteophyte, and early ankylosis of the right side facets as well. No stenosis. T9-T10: New bilateral T9 pedicle screws with no adverse features. Interval pedicle screw removal from T10. Interbody ankylosis via flowing anterior osteophyte, and developing posterior element ankylosis - which appears solid on the left side. T10-T11: Interbody ankylosis via flowing osteophyte and developing posterior element ankylosis which may already be solid on the left side. Mild osseous left T10 foraminal stenosis. T11-T12: The T11 pedicle screws have been revised with no adverse features. Interbody vacuum disc with no interbody ankylosis. Chronic bilateral facet ankylosis with suggestion of developing posterior element arthrodesis especially on the left. Residual circumferential disc osteophyte complex with suspected mild spinal stenosis. T12-L1: Stable posterior element ankylosis/arthrodesis. Partial decompression with no stenosis. L1-L2: Chronically absent L1 pedicle screws with stable interbody implant and L2 screws. Prior decompression with solid arthrodesis. L2-L3: Stable decompression and fusion with stable hardware and solid arthrodesis. L3-L4: Stable decompression and fusion with stable hardware and solid arthrodesis. L4-L5: Chronic decompression with posterior and interbody fusion hardware but absent arthrodesis at this level with chronic interbody vacuum phenomena and bilateral L4 pars type fractures. Improved hardware appearance - posterior spinal rod has been extended inferiorly and now connects with the L5 pedicle screws. However, the pars fractures are less apparent with evidence of healing since August. Evidence of developing arthrodesis although probably not yet solid. L5-S1: Stable chronic endplate and facet hypertrophy with  continued evidence of arthrodesis/ankylosis. Sacrum/ilium: Bilateral medial iliac cortical screws have been placed. The tip of the left screw  is not fully included but there are no adverse features. IMPRESSION: 1. Revision of lower thoracic spinal fusion hardware with addition of T9 pedicle screws, removal of T10 pedicle screws, and revision of T11 pedicle screws. No adverse features. 2. Lower thoracic interbody ankylosis via flowing anterior osteophytes T8 through T11. Developing and/or current posterior element ankylosis at those same levels. 3. Developing T11-T12 ankylosis/arthrodesis suspected in the posterior elements, especially on the left. Suspected mild multifactorial spinal stenosis at this level. 4. Stable T12 through L4 ankylosis/arthrodesis with no adverse hardware features. 5. Absent L4-L5 arthrodesis with continued interbody vacuum phenomena although partial healing of bilateral L4 pars fractures since August, and evidence of developing posterior element arthrodesis although probably not yet solid. 6. Maintained L5-S1 ankylosis, primarily via the posterior elements. 7. Chronic anterior superior SI joint ankylosis with bilateral medial iliac cortical screws newly placed with extension of the posterior spinal rods, no adverse features. 8.  Aortic Atherosclerosis (ICD10-I70.0). Electronically Signed   By: Genevie Ann M.D.   On: 08/24/2018 10:34    Assessment/Plan: Postop day 3 thoracolumbar revision surgery with fixation to the pelvis I am going to order CT of his lumbar spine to evaluate his hardware and will continue to work with Physical occupational therapy.  LOS: 3 days     Keval Nam P 08/24/2018, 3:45 PM

## 2018-08-24 NOTE — Progress Notes (Signed)
Occupational Therapy Treatment Patient Details Name: Michael Barr MRN: [id number] DOB: May 18, 1946 Today's Date: 08/24/2018    History of present illness 72 yo admitted for removal of hardware T10-L5 with T9 to ilium fusion. PMHx: anxiety, asthma, cataract, CAD, depression, HTN, HLD, L TKa, lami and fusion   OT comments  Completed education regarding back precautions for ADL and functional mobility for ADL. Wife able to assist as needed. Pt able to return demonstrate safe tub transfer technique. Pt has all AE and DME. OT signing off.   Follow Up Recommendations  No OT follow up;Supervision - Intermittent    Equipment Recommendations  None recommended by OT    Recommendations for Other Services      Precautions / Restrictions Precautions Precautions: Back;Fall Precaution Comments: pt with good recall Required Braces or Orthoses: Spinal Brace Spinal Brace: Lumbar corset;Applied in sitting position Restrictions Weight Bearing Restrictions: No       Mobility Bed Mobility Overal bed mobility: Modified Independent             General bed mobility comments: HOB 15 degrees with increased time  Transfers Overall transfer level: Modified independent Equipment used: Rolling walker (2 wheeled) Transfers: Sit to/from Stand             Balance Overall balance assessment: Needs assistance   Sitting balance-Leahy Scale: Good       Standing balance-Leahy Scale: Fair                             ADL either performed or assessed with clinical judgement   ADL Overall ADL's : Needs assistance/impaired                                       General ADL Comments:  Reviewed back precautions regarding bathing/dressing and grooming; pt has AE at home; wife assists; Pt able to return demonstrate safe technique to step over tub.     Vision       Perception     Praxis      Cognition Arousal/Alertness: Awake/alert Behavior During Therapy:  WFL for tasks assessed/performed Overall Cognitive Status: Within Functional Limits for tasks assessed                                          Exercises     Shoulder Instructions       General Comments      Pertinent Vitals/ Pain       Pain Assessment: 0-10 Pain Score: 6  Pain Location: back Pain Descriptors / Indicators: Aching;Discomfort;Grimacing Pain Intervention(s): Limited activity within patient's tolerance;Patient requesting pain meds-RN notified  Home Living                                          Prior Functioning/Environment              Frequency           Progress Toward Goals  OT Goals(current goals can now be found in the care plan section)  Progress towards OT goals: Goals met/education completed, patient discharged from OT  Acute Rehab OT Goals Patient Stated Goal: to go home OT Goal  Formulation: With patient/family Potential to Achieve Goals: Good ADL Goals Pt Will Perform Tub/Shower Transfer: Tub transfer;with min assist;ambulating Additional ADL Goal #1: Pt will complete bed mobility supervision level ( using two pillows like home) as precursor for adls.  Plan All goals met and education completed, patient discharged from OT services    Co-evaluation                 AM-PAC PT "6 Clicks" Daily Activity     Outcome Measure   Help from another person eating meals?: None Help from another person taking care of personal grooming?: None Help from another person toileting, which includes using toliet, bedpan, or urinal?: A Little Help from another person bathing (including washing, rinsing, drying)?: A Lot Help from another person to put on and taking off regular upper body clothing?: A Little Help from another person to put on and taking off regular lower body clothing?: A Lot 6 Click Score: 18    End of Session Equipment Utilized During Treatment: Rolling walker;Back brace      Activity  Tolerance Patient tolerated treatment well   Patient Left in bed;with call bell/phone within reach;with family/visitor present   Nurse Communication Patient requests pain meds        Time: 6160-7371 OT Time Calculation (min): 19 min  Charges: OT General Charges $OT Visit: 1 Visit OT Treatments $Self Care/Home Management : 8-22 mins  Maurie Boettcher, OT/L   Acute OT Clinical Specialist [place] Pager [phone] Office [phone]    [facility] 08/24/2018, 11:46 AM

## 2018-08-24 NOTE — Progress Notes (Addendum)
Handoff completed from off going RN Mitzi [employer] and student Candie Chroman.

## 2018-08-25 MED ORDER — OXYCODONE HCL 10 MG PO TABS: 10.0000 mg | ORAL_TABLET | ORAL | 0 refills | Status: DC | PRN

## 2018-08-25 NOTE — Progress Notes (Signed)
Physical Therapy Treatment Patient Details Name: Michael Barr MRN: [id number] DOB: 06/17/1946 Today's Date: 08/25/2018    History of Present Illness 72 yo admitted for removal of hardware T10-L5 with T9 to ilium fusion. PMHx: anxiety, asthma, cataract, CAD, depression, HTN, HLD, L TKa, lami and fusion    PT Comments    Pt pleasant, supine on arrival and states he is ready to return home. Pt needs cues for safety for precautions when in bed to prevent twisting and for posture with gait. He is moving well with increased gait tolerance, don/doff brace independently and able to state progression of activity. Safe for D/C home.    Follow Up Recommendations  Home health PT     Equipment Recommendations  None recommended by PT    Recommendations for Other Services       Precautions / Restrictions Precautions Precautions: Back;Fall Precaution Comments: pt able to state precautions, needs cues to prevent twisting with bed mobility Required Braces or Orthoses: Spinal Brace Spinal Brace: Lumbar corset;Applied in sitting position    Mobility  Bed Mobility Overal bed mobility: Needs Assistance Bed Mobility: Rolling;Sidelying to Sit;Sit to Sidelying Rolling: Supervision Sidelying to sit: Supervision     Sit to sidelying: Supervision General bed mobility comments: supervision for UE positioning to prevent twisting with transitions  Transfers Overall transfer level: Modified independent                  Ambulation/Gait Ambulation/Gait assistance: Supervision Gait Distance (Feet): 280 Feet Assistive device: Rolling walker (2 wheeled) Gait Pattern/deviations: Step-through pattern;Decreased stride length;Trunk flexed   Gait velocity interpretation: >2.62 ft/sec, indicative of community ambulatory General Gait Details: cues for posture and position in RW   Stairs             Wheelchair Mobility    Modified Rankin (Stroke Patients Only)       Balance  Overall balance assessment: Mild deficits observed, not formally tested                                          Cognition Arousal/Alertness: Awake/alert Behavior During Therapy: WFL for tasks assessed/performed Overall Cognitive Status: Within Functional Limits for tasks assessed                                        Exercises      General Comments        Pertinent Vitals/Pain Pain Score: 4  Pain Location: back, right lateral leg Pain Descriptors / Indicators: Aching;Constant Pain Intervention(s): Limited activity within patient's tolerance;Repositioned;Monitored during session    Home Living                      Prior Function            PT Goals (current goals can now be found in the care plan section) Progress towards PT goals: Progressing toward goals    Frequency           PT Plan Current plan remains appropriate    Co-evaluation              AM-PAC PT "6 Clicks" Daily Activity  Outcome Measure  Difficulty turning over in bed (including adjusting bedclothes, sheets and blankets)?: A Little Difficulty moving from lying on back to sitting on  the side of the bed? : A Little Difficulty sitting down on and standing up from a chair with arms (e.g., wheelchair, bedside commode, etc,.)?: A Little Help needed moving to and from a bed to chair (including a wheelchair)?: A Little Help needed walking in hospital room?: A Little Help needed climbing 3-5 steps with a railing? : None 6 Click Score: 19    End of Session Equipment Utilized During Treatment: Back brace Activity Tolerance: Patient tolerated treatment well Patient left: in bed;with call bell/phone within reach;with family/visitor present Nurse Communication: Mobility status PT Visit Diagnosis: Other abnormalities of gait and mobility (R26.89);Muscle weakness (generalized) (M62.81)     Time: 2111-5520 PT Time Calculation (min) (ACUTE ONLY): 20  min  Charges:  $Gait Training: 8-22 mins                     [place], PT Acute Rehabilitation Services Pager: [phone] Office: [phone]    [place] 08/25/2018, 12:36 PM

## 2018-08-25 NOTE — Care Management Important Message (Signed)
Important Message  Patient Details  Name: Michael Barr MRN: [id number] Date of Birth: Jul 18, 1946   Medicare Important Message Given:  Yes    Barb Merino Leisl Spurrier 08/25/2018, 4:02 PM

## 2018-08-25 NOTE — Discharge Summary (Signed)
Physician Discharge Summary  Patient ID: Michael Barr MRN: [id number] DOB/AGE: 70-27-1947 70 y.o. Estimated body mass index is 45.09 kg/m as calculated from the following:   Height as of 08/14/18: 5\' 7"  (1.702 m).   Weight as of 08/14/18: 130.6 kg.   Admit date: 08/21/2018 Discharge date: 08/25/2018  Admission Diagnoses:pseudoarthrosis lumbar spinal stenosis with neurogenic claudication and instability  Discharge Diagnoses: same Active Problems:   Pseudoarthrosis of lumbar spine   Discharged Condition: good  Hospital Course: patient is medical Hospital underwent thoraco-lumbar to the pelvis revision surgery patient is doing very well with improvement of both back and leg pain CT scan yesterday shows excellent position of hardware with no comp case. Patient's pain continued confusion improving stable for discharge home scheduled follow-up 12 weeks.  Consults: Significant Diagnostic Studies: Treatments:T9 to pelvis revision surgery Discharge Exam: Blood pressure 131/69, pulse (!) 53, temperature 97.8 F (36.6 C), temperature source Oral, resp. rate 17, SpO2 96 %. Strength out of 5 wound clean dry and intact  Disposition: home  Discharge Instructions    Face-to-face encounter (required for Medicare/Medicaid patients)   Complete by:  As directed    I Cecilia Vancleve P certify that this patient is under my care and that I, or a nurse practitioner or physician's assistant working with me, had a face-to-face encounter that meets the physician face-to-face encounter requirements with this patient on 08/25/2018. The encounter with the patient was in whole, or in part for the following medical condition(s) which is the primary reason for home health care (List medical condition): pseudoarthrosis degenerative disc disease lumbar spinal stenosis and instability   The encounter with the patient was in whole, or in part, for the following medical condition, which is the primary reason for home  health care:  pseudoarthrosis lumbar spinal stenosis and instability   I certify that, based on my findings, the following services are medically necessary home health services:  Physical therapy   Reason for Medically Necessary Home Health Services:  Therapy- Personnel officer, Training and development officer and Stair Training   My clinical findings support the need for the above services:  Pain interferes with ambulation/mobility   Further, I certify that my clinical findings support that this patient is homebound due to:  Pain interferes with ambulation/mobility   Home Health   Complete by:  As directed    To provide the following care/treatments:  PT     Allergies as of 08/25/2018      Reactions   Penicillins Rash, Other (See Comments)   Has patient had a PCN reaction causing immediate rash, facial/tongue/throat swelling, SOB or lightheadedness with hypotension: UNSPECIFIED  Has patient had a PCN reaction causing severe rash involving mucus membranes or skin necrosis: UNSPECIFIED  Has patient had a PCN reaction that required hospitalization UNSPECIFIED  Has patient had a PCN reaction occurring within the last 10 years: UNSPECIFIED  If all of the above answers are "NO", then may proceed with Cephalosporin use.      Medication List    TAKE these medications   acetaminophen 500 MG tablet Commonly known as:  TYLENOL Take 500 mg by mouth 3 (three) times daily.   ALPRAZolam 0.5 MG tablet Commonly known as:  XANAX Take 0.25 mg by mouth 3 (three) times daily.   aspirin EC 325 MG tablet Take 325 mg by mouth daily.   diphenhydrAMINE 25 mg capsule Commonly known as:  BENADRYL Take 25 mg by mouth at bedtime as needed for itching or allergies.   lisinopril-hydrochlorothiazide 20-25  MG tablet Commonly known as:  PRINZIDE,ZESTORETIC Take 1 tablet by mouth every morning.   methadone 10 MG tablet Commonly known as:  DOLOPHINE Take 1 tablet (10 mg total) by mouth every 12 (twelve) hours. What changed:     how much to take  when to take this   multivitamin with minerals tablet Take 1 tablet by mouth daily.   nitroGLYCERIN 0.4 MG SL tablet Commonly known as:  NITROSTAT Place 0.4 mg under the tongue every 5 (five) minutes as needed for chest pain.   Oxycodone HCl 10 MG Tabs Take 10 mg by mouth 3 (three) times daily. What changed:  Another medication with the same name was added. Make sure you understand how and when to take each.   Oxycodone HCl 10 MG Tabs Take 1 tablet (10 mg total) by mouth every 4 (four) hours as needed. What changed:  You were already taking a medication with the same name, and this prescription was added. Make sure you understand how and when to take each.   Potassium 99 MG Tabs Take 99 mg by mouth daily.   senna 8.6 MG Tabs tablet Commonly known as:  SENOKOT Take 2 tablets by mouth at bedtime.   simvastatin 40 MG tablet Commonly known as:  ZOCOR Take 1 tablet (40 mg total) by mouth at bedtime.   traZODone 100 MG tablet Commonly known as:  DESYREL Take 250 mg by mouth at bedtime.        Signed: Adley Mazurowski P 08/25/2018, 10:37 AM

## 2018-08-25 NOTE — Care Management Note (Signed)
Case Management Note  Patient Details  Name: Ladarren Steiner MRN: [id number] Date of Birth: September 02, 1946  Subjective/Objective:   72 yo admitted for removal of hardware T10-L5 with T9 to ilium fusion.  PTA, pt independent, lives at home with spouse.                 Action/Plan: PT recommending HH follow up, and pt agreeable to services.  Referral to [facility], per pt choice.  Start of care 24-48h post dc date.  No DME needs, per pt/wife.  Wife to provide assistance at dc.  Expected Discharge Date:  08/25/18               Expected Discharge Plan:  [place]  In-House Referral:     Discharge planning Services  CM Consult  Post Acute Care Choice:  Home Health Choice offered to:  Patient  DME Arranged:    DME Agency:     HH Arranged:  PT [place]:  [facility]  Status of Service:  Completed, signed off  If discussed at [place] of Stay Meetings, dates discussed:    Additional Comments:  Reinaldo Raddle, RN, BSN  Trauma/Neuro ICU Case Manager [phone]

## 2018-08-29 ENCOUNTER — Encounter (HOSPITAL_COMMUNITY): Payer: Self-pay | Admitting: Neurosurgery

## 2018-08-29 DIAGNOSIS — Z96652 Presence of left artificial knee joint: Secondary | ICD-10-CM | POA: Diagnosis not present

## 2018-08-29 DIAGNOSIS — M48062 Spinal stenosis, lumbar region with neurogenic claudication: Secondary | ICD-10-CM | POA: Diagnosis not present

## 2018-08-29 DIAGNOSIS — Z951 Presence of aortocoronary bypass graft: Secondary | ICD-10-CM | POA: Diagnosis not present

## 2018-08-29 DIAGNOSIS — M797 Fibromyalgia: Secondary | ICD-10-CM | POA: Diagnosis not present

## 2018-08-29 DIAGNOSIS — I2510 Atherosclerotic heart disease of native coronary artery without angina pectoris: Secondary | ICD-10-CM | POA: Diagnosis not present

## 2018-08-29 DIAGNOSIS — I10 Essential (primary) hypertension: Secondary | ICD-10-CM | POA: Diagnosis not present

## 2018-08-29 DIAGNOSIS — M960 Pseudarthrosis after fusion or arthrodesis: Secondary | ICD-10-CM | POA: Diagnosis not present

## 2018-08-29 DIAGNOSIS — Z87891 Personal history of nicotine dependence: Secondary | ICD-10-CM | POA: Diagnosis not present

## 2018-08-29 DIAGNOSIS — T84226D Displacement of internal fixation device of vertebrae, subsequent encounter: Secondary | ICD-10-CM | POA: Diagnosis not present

## 2018-08-30 ENCOUNTER — Encounter (HOSPITAL_COMMUNITY): Payer: Self-pay | Admitting: Neurosurgery

## 2018-09-06 DIAGNOSIS — M960 Pseudarthrosis after fusion or arthrodesis: Secondary | ICD-10-CM | POA: Diagnosis not present

## 2018-09-06 DIAGNOSIS — I2510 Atherosclerotic heart disease of native coronary artery without angina pectoris: Secondary | ICD-10-CM | POA: Diagnosis not present

## 2018-09-06 DIAGNOSIS — T84226D Displacement of internal fixation device of vertebrae, subsequent encounter: Secondary | ICD-10-CM | POA: Diagnosis not present

## 2018-09-06 DIAGNOSIS — I10 Essential (primary) hypertension: Secondary | ICD-10-CM | POA: Diagnosis not present

## 2018-09-06 DIAGNOSIS — M48062 Spinal stenosis, lumbar region with neurogenic claudication: Secondary | ICD-10-CM | POA: Diagnosis not present

## 2018-09-06 DIAGNOSIS — M797 Fibromyalgia: Secondary | ICD-10-CM | POA: Diagnosis not present

## 2018-09-08 DIAGNOSIS — M797 Fibromyalgia: Secondary | ICD-10-CM | POA: Diagnosis not present

## 2018-09-08 DIAGNOSIS — T84226D Displacement of internal fixation device of vertebrae, subsequent encounter: Secondary | ICD-10-CM | POA: Diagnosis not present

## 2018-09-08 DIAGNOSIS — I2510 Atherosclerotic heart disease of native coronary artery without angina pectoris: Secondary | ICD-10-CM | POA: Diagnosis not present

## 2018-09-08 DIAGNOSIS — M960 Pseudarthrosis after fusion or arthrodesis: Secondary | ICD-10-CM | POA: Diagnosis not present

## 2018-09-08 DIAGNOSIS — I10 Essential (primary) hypertension: Secondary | ICD-10-CM | POA: Diagnosis not present

## 2018-09-08 DIAGNOSIS — M48062 Spinal stenosis, lumbar region with neurogenic claudication: Secondary | ICD-10-CM | POA: Diagnosis not present

## 2018-09-11 DIAGNOSIS — M960 Pseudarthrosis after fusion or arthrodesis: Secondary | ICD-10-CM | POA: Diagnosis not present

## 2018-09-11 DIAGNOSIS — M797 Fibromyalgia: Secondary | ICD-10-CM | POA: Diagnosis not present

## 2018-09-11 DIAGNOSIS — I10 Essential (primary) hypertension: Secondary | ICD-10-CM | POA: Diagnosis not present

## 2018-09-11 DIAGNOSIS — M48062 Spinal stenosis, lumbar region with neurogenic claudication: Secondary | ICD-10-CM | POA: Diagnosis not present

## 2018-09-11 DIAGNOSIS — I2510 Atherosclerotic heart disease of native coronary artery without angina pectoris: Secondary | ICD-10-CM | POA: Diagnosis not present

## 2018-09-11 DIAGNOSIS — T84226D Displacement of internal fixation device of vertebrae, subsequent encounter: Secondary | ICD-10-CM | POA: Diagnosis not present

## 2018-09-13 DIAGNOSIS — I2510 Atherosclerotic heart disease of native coronary artery without angina pectoris: Secondary | ICD-10-CM | POA: Diagnosis not present

## 2018-09-13 DIAGNOSIS — M48062 Spinal stenosis, lumbar region with neurogenic claudication: Secondary | ICD-10-CM | POA: Diagnosis not present

## 2018-09-13 DIAGNOSIS — M797 Fibromyalgia: Secondary | ICD-10-CM | POA: Diagnosis not present

## 2018-09-13 DIAGNOSIS — I10 Essential (primary) hypertension: Secondary | ICD-10-CM | POA: Diagnosis not present

## 2018-09-13 DIAGNOSIS — T84226D Displacement of internal fixation device of vertebrae, subsequent encounter: Secondary | ICD-10-CM | POA: Diagnosis not present

## 2018-09-13 DIAGNOSIS — M960 Pseudarthrosis after fusion or arthrodesis: Secondary | ICD-10-CM | POA: Diagnosis not present

## 2018-09-18 DIAGNOSIS — I10 Essential (primary) hypertension: Secondary | ICD-10-CM | POA: Diagnosis not present

## 2018-09-18 DIAGNOSIS — M797 Fibromyalgia: Secondary | ICD-10-CM | POA: Diagnosis not present

## 2018-09-18 DIAGNOSIS — M48062 Spinal stenosis, lumbar region with neurogenic claudication: Secondary | ICD-10-CM | POA: Diagnosis not present

## 2018-09-18 DIAGNOSIS — M960 Pseudarthrosis after fusion or arthrodesis: Secondary | ICD-10-CM | POA: Diagnosis not present

## 2018-09-18 DIAGNOSIS — I2510 Atherosclerotic heart disease of native coronary artery without angina pectoris: Secondary | ICD-10-CM | POA: Diagnosis not present

## 2018-09-18 DIAGNOSIS — T84226D Displacement of internal fixation device of vertebrae, subsequent encounter: Secondary | ICD-10-CM | POA: Diagnosis not present

## 2018-09-22 DIAGNOSIS — I2510 Atherosclerotic heart disease of native coronary artery without angina pectoris: Secondary | ICD-10-CM | POA: Diagnosis not present

## 2018-09-22 DIAGNOSIS — M48062 Spinal stenosis, lumbar region with neurogenic claudication: Secondary | ICD-10-CM | POA: Diagnosis not present

## 2018-09-22 DIAGNOSIS — T84226D Displacement of internal fixation device of vertebrae, subsequent encounter: Secondary | ICD-10-CM | POA: Diagnosis not present

## 2018-09-22 DIAGNOSIS — M960 Pseudarthrosis after fusion or arthrodesis: Secondary | ICD-10-CM | POA: Diagnosis not present

## 2018-09-22 DIAGNOSIS — M797 Fibromyalgia: Secondary | ICD-10-CM | POA: Diagnosis not present

## 2018-09-22 DIAGNOSIS — I10 Essential (primary) hypertension: Secondary | ICD-10-CM | POA: Diagnosis not present

## 2018-09-25 DIAGNOSIS — M48062 Spinal stenosis, lumbar region with neurogenic claudication: Secondary | ICD-10-CM | POA: Diagnosis not present

## 2018-09-25 DIAGNOSIS — I2510 Atherosclerotic heart disease of native coronary artery without angina pectoris: Secondary | ICD-10-CM | POA: Diagnosis not present

## 2018-09-25 DIAGNOSIS — M797 Fibromyalgia: Secondary | ICD-10-CM | POA: Diagnosis not present

## 2018-09-25 DIAGNOSIS — M960 Pseudarthrosis after fusion or arthrodesis: Secondary | ICD-10-CM | POA: Diagnosis not present

## 2018-09-25 DIAGNOSIS — I10 Essential (primary) hypertension: Secondary | ICD-10-CM | POA: Diagnosis not present

## 2018-09-25 DIAGNOSIS — T84226D Displacement of internal fixation device of vertebrae, subsequent encounter: Secondary | ICD-10-CM | POA: Diagnosis not present

## 2018-09-27 DIAGNOSIS — M960 Pseudarthrosis after fusion or arthrodesis: Secondary | ICD-10-CM | POA: Diagnosis not present

## 2018-09-27 DIAGNOSIS — M48062 Spinal stenosis, lumbar region with neurogenic claudication: Secondary | ICD-10-CM | POA: Diagnosis not present

## 2018-09-27 DIAGNOSIS — T84226D Displacement of internal fixation device of vertebrae, subsequent encounter: Secondary | ICD-10-CM | POA: Diagnosis not present

## 2018-09-27 DIAGNOSIS — I2510 Atherosclerotic heart disease of native coronary artery without angina pectoris: Secondary | ICD-10-CM | POA: Diagnosis not present

## 2018-09-27 DIAGNOSIS — I10 Essential (primary) hypertension: Secondary | ICD-10-CM | POA: Diagnosis not present

## 2018-09-27 DIAGNOSIS — M797 Fibromyalgia: Secondary | ICD-10-CM | POA: Diagnosis not present

## 2018-09-28 DIAGNOSIS — S32009K Unspecified fracture of unspecified lumbar vertebra, subsequent encounter for fracture with nonunion: Secondary | ICD-10-CM | POA: Diagnosis not present

## 2018-09-28 DIAGNOSIS — M5440 Lumbago with sciatica, unspecified side: Secondary | ICD-10-CM | POA: Diagnosis not present

## 2018-10-01 DIAGNOSIS — Z23 Encounter for immunization: Secondary | ICD-10-CM | POA: Diagnosis not present

## 2018-10-31 DIAGNOSIS — G894 Chronic pain syndrome: Secondary | ICD-10-CM | POA: Diagnosis not present

## 2018-10-31 DIAGNOSIS — M797 Fibromyalgia: Secondary | ICD-10-CM | POA: Diagnosis not present

## 2018-10-31 DIAGNOSIS — Z79899 Other long term (current) drug therapy: Secondary | ICD-10-CM | POA: Diagnosis not present

## 2018-10-31 DIAGNOSIS — M47816 Spondylosis without myelopathy or radiculopathy, lumbar region: Secondary | ICD-10-CM | POA: Diagnosis not present

## 2018-11-06 ENCOUNTER — Encounter: Payer: Self-pay | Admitting: Family Medicine

## 2018-11-06 ENCOUNTER — Ambulatory Visit (INDEPENDENT_AMBULATORY_CARE_PROVIDER_SITE_OTHER): Payer: Medicare Other | Admitting: Family Medicine

## 2018-11-06 VITALS — BP 126/80 | Ht 67.0 in | Wt 300.0 lb

## 2018-11-06 DIAGNOSIS — Z0000 Encounter for general adult medical examination without abnormal findings: Secondary | ICD-10-CM | POA: Diagnosis not present

## 2018-11-06 DIAGNOSIS — Z79899 Other long term (current) drug therapy: Secondary | ICD-10-CM | POA: Diagnosis not present

## 2018-11-06 DIAGNOSIS — I10 Essential (primary) hypertension: Secondary | ICD-10-CM | POA: Diagnosis not present

## 2018-11-06 DIAGNOSIS — E785 Hyperlipidemia, unspecified: Secondary | ICD-10-CM | POA: Diagnosis not present

## 2018-11-06 MED ORDER — SIMVASTATIN 40 MG PO TABS: 40.0000 mg | ORAL_TABLET | Freq: Every day | ORAL | 1 refills | Status: DC

## 2018-11-06 MED ORDER — LISINOPRIL-HYDROCHLOROTHIAZIDE 20-25 MG PO TABS: 1.0000 | ORAL_TABLET | Freq: Every morning | ORAL | 1 refills | Status: DC

## 2018-11-06 NOTE — Progress Notes (Signed)
Subjective:    Patient ID: Michael Barr, male    DOB: Dec 14, 1945, 73 y.o.   MRN: [id number]  HPI AWV- Annual Wellness Visit  The patient was seen for their annual wellness visit. The patient's past medical history, surgical history, and family history were reviewed. Pertinent vaccines were reviewed ( tetanus, pneumonia, shingles, flu) The patient's medication list was reviewed and updated.  The height and weight were entered.  BMI recorded in electronic record elsewhere  Cognitive screening was completed. Outcome of Mini - Cog: pass   Falls /depression screening electronically recorded within record elsewhere  Current tobacco usage: none (All patients who use tobacco were given written and verbal information on quitting)  Recent listing of emergency department/hospitalizations over the past year were reviewed.  current specialist the patient sees on a regular basis: pt sees pain management clinic   Medicare annual wellness visit patient questionnaire was reviewed.  A written screening schedule for the patient for the next 5-10 years was given. Appropriate discussion of followup regarding next visit was discussed.  Pt states that he is unable to do prostate exam due to being fused from top to bottom and not being able to clean properly.   Blood pressure medicine and blood pressure levels reviewed today with patient. Compliant with blood pressure medicine. States does not miss a dose. No obvious side effects. Blood pressure generally good when checked elsewhere. Watching salt intake.   Patient continues to take lipid medication regularly. No obvious side effects from it. Generally does not miss a dose. Prior blood work results are reviewed with patient. Patient continues to work on fat intake in diet   Pt had surg 0ct 28th, took six hrs, had to have rather sig spinal surgery   Exercising regully these days   Was placed on generic cymbalta, wanted to try lower  dose,,          +       Review of Systems  Constitutional: Negative for activity change, appetite change and fever.  HENT: Negative for congestion and rhinorrhea.   Eyes: Negative for discharge.  Respiratory: Negative for cough and wheezing.   Cardiovascular: Negative for chest pain.  Gastrointestinal: Negative for abdominal pain, blood in stool and vomiting.  Genitourinary: Negative for difficulty urinating and frequency.  Musculoskeletal: Negative for neck pain.  Skin: Negative for rash.  Allergic/Immunologic: Negative for environmental allergies and food allergies.  Neurological: Negative for weakness and headaches.  Psychiatric/Behavioral: Negative for agitation.  All other systems reviewed and are negative.      Objective:   Physical Exam Vitals signs reviewed.  Constitutional:      Appearance: He is well-developed.  HENT:     Head: Normocephalic and atraumatic.     Right Ear: External ear normal.     Left Ear: External ear normal.     Nose: Nose normal.  Eyes:     Pupils: Pupils are equal, round, and reactive to light.  Neck:     Musculoskeletal: Normal range of motion and neck supple.     Thyroid: No thyromegaly.  Cardiovascular:     Rate and Rhythm: Normal rate and regular rhythm.     Heart sounds: Normal heart sounds. No murmur.  Pulmonary:     Effort: Pulmonary effort is normal. No respiratory distress.     Breath sounds: Normal breath sounds. No wheezing.  Abdominal:     General: Bowel sounds are normal. There is no distension.     Palpations: Abdomen is  soft. There is no mass.     Tenderness: There is no abdominal tenderness.  Genitourinary:    Penis: Normal.   Musculoskeletal: Normal range of motion.  Lymphadenopathy:     Cervical: No cervical adenopathy.  Skin:    General: Skin is warm and dry.     Findings: No erythema.  Neurological:     Mental Status: He is alert.     Motor: No abnormal muscle tone.  Psychiatric:        Behavior:  Behavior normal.        Judgment: Judgment normal.           Assessment & Plan:  Impression wellness exam.  Diet discussed.  Exercise discussed.  Up-to-date on vaccinations.  Uncertain as to when last colonoscopy.  Will attempt to get old records.  2.  Hypertension.  Good control.  Discussed maintain same meds  3.  Hyperlipidemia.  Current status uncertain.  We will do blood work Patient  4.  Status post major spinal surgery with insertion of rods and laminectomy  Follow-up in 6 months diet exercise discussed

## 2018-11-07 DIAGNOSIS — Z6841 Body Mass Index (BMI) 40.0 and over, adult: Secondary | ICD-10-CM | POA: Diagnosis not present

## 2018-11-07 DIAGNOSIS — S32009K Unspecified fracture of unspecified lumbar vertebra, subsequent encounter for fracture with nonunion: Secondary | ICD-10-CM | POA: Diagnosis not present

## 2018-11-07 DIAGNOSIS — I10 Essential (primary) hypertension: Secondary | ICD-10-CM | POA: Diagnosis not present

## 2018-12-25 DIAGNOSIS — M545 Low back pain: Secondary | ICD-10-CM | POA: Diagnosis not present

## 2018-12-25 DIAGNOSIS — G894 Chronic pain syndrome: Secondary | ICD-10-CM | POA: Diagnosis not present

## 2018-12-25 DIAGNOSIS — M797 Fibromyalgia: Secondary | ICD-10-CM | POA: Diagnosis not present

## 2018-12-25 DIAGNOSIS — Z79891 Long term (current) use of opiate analgesic: Secondary | ICD-10-CM | POA: Diagnosis not present

## 2018-12-25 DIAGNOSIS — Z5181 Encounter for therapeutic drug level monitoring: Secondary | ICD-10-CM | POA: Diagnosis not present

## 2019-02-13 DIAGNOSIS — S32009K Unspecified fracture of unspecified lumbar vertebra, subsequent encounter for fracture with nonunion: Secondary | ICD-10-CM | POA: Diagnosis not present

## 2019-02-27 DIAGNOSIS — G894 Chronic pain syndrome: Secondary | ICD-10-CM | POA: Diagnosis not present

## 2019-04-03 ENCOUNTER — Encounter: Payer: Self-pay | Admitting: Family Medicine

## 2019-04-03 ENCOUNTER — Other Ambulatory Visit: Payer: Self-pay

## 2019-04-03 ENCOUNTER — Ambulatory Visit (INDEPENDENT_AMBULATORY_CARE_PROVIDER_SITE_OTHER): Payer: Medicare Other | Admitting: Family Medicine

## 2019-04-03 DIAGNOSIS — K921 Melena: Secondary | ICD-10-CM | POA: Diagnosis not present

## 2019-04-03 DIAGNOSIS — K529 Noninfective gastroenteritis and colitis, unspecified: Secondary | ICD-10-CM | POA: Diagnosis not present

## 2019-04-03 NOTE — Progress Notes (Signed)
   Subjective:    Patient ID: Michael Barr, male    DOB: September 17, 1946, 73 y.o.   MRN: [id number] Audio 1 9 Diarrhea   This is a new problem. Episode onset: over one month. not every day. The stool consistency is described as watery (black stools). Associated symptoms include abdominal pain.   Virtual Visit via Telephone Note  I connected with Rivan Siordia on 04/03/19 at 11:30 AM EDT by telephone and verified that I am speaking with the correct person using two identifiers.  Location: Patient: home Provider: office   I discussed the limitations, risks, security and privacy concerns of performing an evaluation and management service by telephone and the availability of in person appointments. I also discussed with the patient that there may be a patient responsible charge related to this service. The patient expressed understanding and agreed to proceed.   History of Present Illness:    Observations/Objective:   Assessment and Plan:   Follow Up Instructions:    I discussed the assessment and treatment plan with the patient. The patient was provided an opportunity to ask questions and all were answered. The patient agreed with the plan and demonstrated an understanding of the instructions.   The patient was advised to call back or seek an in-person evaluation if the symptoms worsen or if the condition fails to improve as anticipated.  I provided 25 minutes of non-face-to-face time during this encounter.  Patient presents with concerns regarding bowel consistency.  6 weeks duration at least.  Will use alternating with hard stools.  Patient also very concerned about dark black color to stools over the last 6-week.  On further questioning patient does use Pepto-Bismol on a very regular basis.  Please see prior notes  Half year ago patient was not sure of last colonoscopy he was going to try to research is unable to find out.  It is been nearly 10 years per patient's  response   Review of Systems  Gastrointestinal: Positive for abdominal pain and diarrhea.  No headache, no major weight loss or weight gain, no chest pain no back pain abdominal pain no change in bowel habits complete ROS otherwise negative      Objective:   Physical Exam  Virtual      Assessment & Plan:  Impression 1 dark stools.  Likely secondary to Pepto-Bismol discussed  2.  Intermittent diarrhea may add Imodium as needed rationale discussed  3.  Change in bowel habits over 6 weeks with uncertainty as far as last colonoscopy.  Time to press on and get the GI folks to reassess: Discussed

## 2019-04-03 NOTE — Addendum Note (Signed)
Addended by: Vicente Males on: 04/03/2019 01:07 PM   Modules accepted: Orders

## 2019-04-06 ENCOUNTER — Encounter: Payer: Self-pay | Admitting: Family Medicine

## 2019-04-11 ENCOUNTER — Encounter: Payer: Self-pay | Admitting: Internal Medicine

## 2019-04-24 DIAGNOSIS — I10 Essential (primary) hypertension: Secondary | ICD-10-CM | POA: Diagnosis not present

## 2019-04-24 DIAGNOSIS — Z79899 Other long term (current) drug therapy: Secondary | ICD-10-CM | POA: Diagnosis not present

## 2019-04-24 DIAGNOSIS — E785 Hyperlipidemia, unspecified: Secondary | ICD-10-CM | POA: Diagnosis not present

## 2019-04-25 LAB — HEPATIC FUNCTION PANEL
ALBUMIN: 4.6 g/dL (ref 3.7–4.7)
ALK PHOS: 59 IU/L (ref 39–117)
ALT: 15 IU/L (ref 0–44)
AST: 17 IU/L (ref 0–40)
Bilirubin Total: 0.4 mg/dL (ref 0.0–1.2)
Bilirubin, Direct: 0.14 mg/dL (ref 0.00–0.40)
Total Protein: 7 g/dL (ref 6.0–8.5)

## 2019-04-25 LAB — LIPID PANEL
Chol/HDL Ratio: 3.8 ratio (ref 0.0–5.0)
Cholesterol, Total: 162 mg/dL (ref 100–199)
HDL: 43 mg/dL (ref >39)
LDL Calculated: 81 mg/dL (ref 0–99)
Triglycerides: 190 mg/dL — ABNORMAL HIGH (ref 0–149)
VLDL Cholesterol Cal: 38 mg/dL (ref 5–40)

## 2019-05-01 NOTE — Progress Notes (Signed)
Referring Provider: Dr. Wolfgang Phoenix Primary Care Physician:  Mikey Kirschner, MD Primary Gastroenterologist:  Dr. Gala Romney  Chief Complaint  Patient presents with  . Diarrhea  . Melena    noticed 3-4 times. pt took Pepto and d/c. pt hasn't noticed anymore black stool since he d/c Pepto. TCS several years ago    HPI:   Michael Barr is a 73 y.o. male presenting today at the request of Dr. Wolfgang Phoenix For diarrhea and black stools. Past medical history significant for coronary artery disease, chronic pain, hypertension, hyperlipidemia, Asthma, and GERD.    Today he states several weeks ago after eating out, he developed a stomach ache and diarrhea. Started taking pepto-bismol and subsequently developed black stools. Diarrhea would last a few days, stop for about 1.5 weeks and start again. This cycle happened about 3-4 times. After seeing PCP on 6/9, patient was instructed to stop pepto-bismol and to use imodium as needed.  Since stopping pepto-bismol about 1 month ago, patient has not had any diarrhea, black stools, or abdominal pain. Has not had to use any imodium. BMs back to baseline with one every 1-2 days. Will take one senokot as needed. No brbpr. No upper GI symptoms. No dysphagia. Aspirin daily, no other NSAIDs.  Colonoscopy in distant past. Patient doesn't remember if he had any polyps.    Extensive back surgeries. Follows closely with Psychologist, sport and exercise. Is limited in movements. Reports some memory trouble since last back surgery in October 2019 due to surgery being about 7 hours. This is improving and he has followed up with surgeon on this.   Denies fever, chills, fatigue, lightheadedness, dizziness, chest pain, and shortness of breath. Admits to trying to lose weight and watch what he is eating.    Past Medical History:  Diagnosis Date  . Anxiety    takes Xanax and Methadone daily  . Arthritis    low back- DDD  . Asthma 70's  . Cataract    immature and to the left eye  . Chronic back pain     HNP  . Constipation    takes Sennokot nightly  . Coronary artery disease   . Depression    attacks  . Fibromyalgia   . GERD (gastroesophageal reflux disease)    20 YRS AGO, AND DIET CONTROL  . History of kidney stones   . Hyperlipidemia    takes Zocor daily  . Hypertension    takes Zestoretic daily  . Hypokalemia    takes Potassium daily  . Insomnia    takes trazodone nightly  . Joint pain   . Joint swelling   . Nocturia   . Pneumonia 70's  . Weakness    right leg    Past Surgical History:  Procedure Laterality Date  . APPLICATION OF INTRAOPERATIVE CT SCAN N/A 08/21/2018   Procedure: APPLICATION OF INTRAOPERATIVE CT SCAN;  Surgeon: Kary Kos, MD;  Location: [facility];  Service: Neurosurgery;  Laterality: N/A;  . BACK SURGERY     multiple back surgeries  . CARDIAC CATHETERIZATION     unsure of how long ago  . CARPAL TUNNEL RELEASE Bilateral 70's  . CHOLECYSTECTOMY  1985  . COLONOSCOPY    . CORONARY ARTERY BYPASS GRAFT  1996   1 vessel  . HARDWARE REMOVAL N/A 03/26/2016   Procedure: HARDWARE REMOVAL Lumbar one-two ;  Surgeon: Kary Kos, MD;  Location: [facility];  Service: Neurosurgery;  Laterality: N/A;  . JOINT REPLACEMENT  1999   lt knee  .  KNEE ARTHROSCOPY Right   . LUMBAR LAMINECTOMY/DECOMPRESSION MICRODISCECTOMY Right 09/26/2013   Procedure: LUMBAR LAMINECTOMY/DECOMPRESSION MICRODISCECTOMY RIGHT THREE-FOUR;  Surgeon: Elaina Hoops, MD;  Location: [facility];  Service: Neurosurgery;  Laterality: Right;  . pilondial cyst     x 2   . POSTERIOR LUMBAR FUSION 4 LEVEL N/A 08/21/2018   Procedure: Posterior Lateral Fusion Thoracic Eight to Pelvis with Iliac screws and Fenestrated screws with kyphon and AIRO;  Surgeon: Kary Kos, MD;  Location: [facility];  Service: Neurosurgery;  Laterality: N/A;  . SHOULDER ARTHROSCOPY Bilateral    left x 2 and right x 1  . TONSILLECTOMY    . tumor removed from right ankle   70's    Current Outpatient Medications  Medication Sig  Dispense Refill  . acetaminophen (TYLENOL) 500 MG tablet Take 500 mg by mouth 3 (three) times daily.     Marland Kitchen ALPRAZolam (XANAX) 0.5 MG tablet Take 0.25 mg by mouth 3 (three) times daily.     Marland Kitchen aspirin EC 325 MG tablet Take 325 mg by mouth daily.    . diphenhydrAMINE (BENADRYL) 25 mg capsule Take 25 mg by mouth at bedtime as needed for itching or allergies.    Marland Kitchen lisinopril-hydrochlorothiazide (PRINZIDE,ZESTORETIC) 20-25 MG tablet Take 1 tablet by mouth every morning. 90 tablet 1  . methadone (DOLOPHINE) 10 MG tablet Take 1 tablet (10 mg total) by mouth every 12 (twelve) hours. (Patient taking differently: Take 5 mg by mouth 3 (three) times daily. ) 30 tablet 0  . Multiple Vitamins-Minerals (MULTIVITAMIN WITH MINERALS) tablet Take 1 tablet by mouth daily.    . nitroGLYCERIN (NITROSTAT) 0.4 MG SL tablet Place 0.4 mg under the tongue every 5 (five) minutes as needed for chest pain.    . Oxycodone HCl 10 MG TABS Take 10 mg by mouth 3 (three) times daily.    . Oxycodone HCl 10 MG TABS Take 1 tablet (10 mg total) by mouth every 4 (four) hours as needed. 60 tablet 0  . Potassium 99 MG TABS Take 99 mg by mouth daily.    Marland Kitchen senna (SENOKOT) 8.6 MG TABS tablet Take 2 tablets by mouth as needed.     . simvastatin (ZOCOR) 40 MG tablet Take 1 tablet (40 mg total) by mouth at bedtime. 90 tablet 1  . traZODone (DESYREL) 100 MG tablet Take 250 mg by mouth at bedtime.      No current facility-administered medications for this visit.     Allergies as of 05/02/2019 - Review Complete 05/02/2019  Allergen Reaction Noted  . Penicillins Rash and Other (See Comments) 09/18/2013    Family History  Problem Relation Age of Onset  . Colon cancer Neg Hx     Social History   Socioeconomic History  . Marital status: Married    Spouse name: Not on file  . Number of children: Not on file  . Years of education: Not on file  . Highest education level: Not on file  Occupational History  . Not on file  Social Needs  .  Financial resource strain: Not on file  . Food insecurity    Worry: Not on file    Inability: Not on file  . Transportation needs    Medical: Not on file    Non-medical: Not on file  Tobacco Use  . Smoking status: Former Research scientist (life sciences)  . Smokeless tobacco: Never Used  . Tobacco comment: quit smoking early 70's  Substance and Sexual Activity  . Alcohol use: No  .  Drug use: No  . Sexual activity: Not Currently  Lifestyle  . Physical activity    Days per week: Not on file    Minutes per session: Not on file  . Stress: Not on file  Relationships  . Social Herbalist on phone: Not on file    Gets together: Not on file    Attends religious service: Not on file    Active member of club or organization: Not on file    Attends meetings of clubs or organizations: Not on file    Relationship status: Not on file  . Intimate partner violence    Fear of current or ex partner: Not on file    Emotionally abused: Not on file    Physically abused: Not on file    Forced sexual activity: Not on file  Other Topics Concern  . Not on file  Social History Narrative  . Not on file    Review of Systems: Gen: See HPI.   CV: Denies heart palpitations. Admits to some lower extremity edema that resolves by morning.  Resp: Denies shortness of breath at rest. Can notice he is out of shape if he exerts himself. Denies wheezing or cough.  GI: See HPI GU : Denies urinary burning, urinary frequency, urinary hesitancy MS: Chronic back pain.  Derm: Admits to itching sometimes since last surgery.  Psych: Denies depression, anxiety Heme: Denies bruising, bleeding  Physical Exam: BP 121/60   Pulse 64   Temp 98.3 F (36.8 C) (Oral)   Ht 5\' 8"  (1.727 m)   Wt (!) 300 lb 6.4 oz (136.3 kg)   BMI 45.68 kg/m  General:   Alert and oriented. Pleasant and cooperative. Well-nourished and well-developed.  Head:  Normocephalic and atraumatic. Eyes:  Without icterus, sclera clear and conjunctiva pink.   Ears:  Normal auditory acuity. Nose:  No deformity, discharge,  or lesions.  Lungs:  Clear to auscultation bilaterally. No wheezes, rales, or rhonchi. No distress.  Heart:  S1, S2 present without murmurs appreciated.  Abdomen:  +BS, soft, non-tender and non-distended. No guarding or rebound. No masses appreciated.  Rectal:  Deferred  Msk:  Symmetrical without gross deformities. Normal posture. Extremities:  Without minimal below the knee pitting edema.  Neurologic:  Alert and  oriented x4;  grossly normal neurologically. Skin:  Intact without significant lesions or rashes. Psych:  Alert and cooperative. Normal mood and affect.

## 2019-05-02 ENCOUNTER — Other Ambulatory Visit: Payer: Self-pay

## 2019-05-02 ENCOUNTER — Encounter: Payer: Self-pay | Admitting: Gastroenterology

## 2019-05-02 ENCOUNTER — Ambulatory Visit (INDEPENDENT_AMBULATORY_CARE_PROVIDER_SITE_OTHER): Payer: Medicare Other | Admitting: Gastroenterology

## 2019-05-02 ENCOUNTER — Telehealth: Payer: Self-pay

## 2019-05-02 ENCOUNTER — Telehealth: Payer: Self-pay | Admitting: Gastroenterology

## 2019-05-02 DIAGNOSIS — R197 Diarrhea, unspecified: Secondary | ICD-10-CM

## 2019-05-02 MED ORDER — PEG 3350-KCL-NA BICARB-NACL 420 G PO SOLR: 4000.0000 mL | ORAL | 0 refills | Status: DC

## 2019-05-02 NOTE — Telephone Encounter (Signed)
Michael Barr can you request colonoscopy records for this patient? Reports colonoscopy was performed at the hospital in [place], [place].

## 2019-05-02 NOTE — Assessment & Plan Note (Addendum)
73 y.o. male with past medical history of coronary artery disease s/p 1 vessel CABG in 1996, chronic pain s/p multiple back surgeries with hardware in palce, hypertension, hyperlipidemia, Asthma, and GERD. Patient presenting after experiencing diarrhea after eating out and developing "belly ache." Diarrhea occurring on and off over several weeks. Patient was taking pepto-bismol and had black stools. Patient saw PCP in June who recommended stopping pepto-bismol. Since stopping pepto-bismol, patient's diarrhea and black stools have resolved. No abdominal pain. No brbpr. No upper GI symptoms. Unsure of when last colonoscopy was, but states it was several years ago.   I suspect diarrhea was related to an acute gastroenteritis possibly complicated by post infectious gastroenteritis with pepto-bismol causing black stools. However, since it has been several years since last colonoscopy, will proceed with scheduling colonoscopy with propofol with Dr. Gala Romney in the near future. The risks, benefits, and alternatives have been discussed in detail with patient. They have stated understanding and desire to proceed.   Patient is to follow-up as needed.   Addendum: Patient had reported he thought last colonoscopy was at [facility]. Requested records, but [name] has no records for patient.

## 2019-05-02 NOTE — Progress Notes (Signed)
cc'ed to pcp °

## 2019-05-02 NOTE — Telephone Encounter (Signed)
TCS w/Propofol w/RMR scheduled for 07/09/19 at 12:15pm during OV. Pre-op appt 07/05/19 at 8:00am. Letter mailed with procedure instructions.

## 2019-05-02 NOTE — Patient Instructions (Addendum)
1. We will get you scheduled for colonoscopy in the near future with Dr. Gala Romney.   We will follow-up with you as needed. Call if you have any concerns.   I am glad you are feeling better!  Aliene Altes, PA-C [facility]

## 2019-05-03 NOTE — Telephone Encounter (Signed)
Requested.

## 2019-05-05 ENCOUNTER — Other Ambulatory Visit: Payer: Self-pay | Admitting: Family Medicine

## 2019-05-07 ENCOUNTER — Other Ambulatory Visit: Payer: Self-pay

## 2019-05-07 ENCOUNTER — Ambulatory Visit (INDEPENDENT_AMBULATORY_CARE_PROVIDER_SITE_OTHER): Payer: Medicare Other | Admitting: Family Medicine

## 2019-05-07 DIAGNOSIS — I10 Essential (primary) hypertension: Secondary | ICD-10-CM | POA: Diagnosis not present

## 2019-05-07 DIAGNOSIS — I2584 Coronary atherosclerosis due to calcified coronary lesion: Secondary | ICD-10-CM

## 2019-05-07 DIAGNOSIS — E785 Hyperlipidemia, unspecified: Secondary | ICD-10-CM | POA: Diagnosis not present

## 2019-05-07 DIAGNOSIS — I2510 Atherosclerotic heart disease of native coronary artery without angina pectoris: Secondary | ICD-10-CM | POA: Diagnosis not present

## 2019-05-07 DIAGNOSIS — R413 Other amnesia: Secondary | ICD-10-CM

## 2019-05-07 MED ORDER — LISINOPRIL-HYDROCHLOROTHIAZIDE 20-25 MG PO TABS: 1.0000 | ORAL_TABLET | Freq: Every morning | ORAL | 1 refills | Status: DC

## 2019-05-07 MED ORDER — SIMVASTATIN 40 MG PO TABS: 40.0000 mg | ORAL_TABLET | Freq: Every day | ORAL | 1 refills | Status: DC

## 2019-05-07 NOTE — Progress Notes (Signed)
   Subjective:  audio  Patient ID: Michael Barr, male    DOB: 04-18-1946, 73 y.o.   MRN: [id number]  Hypertension This is a chronic problem. There are no compliance problems.    Pt states his BP was 120/70 the other day. Pt states he has been doing well.   Pt is wanting to know if it is ok to take B12 vitamin to help with memory. Pt is currently taking one every other day.  Virtual Visit via Video Note  I connected with Michael Barr on 05/07/19 at  2:00 PM EDT by a video enabled telemedicine application and verified that I am speaking with the correct person using two identifiers.  Location: Patient: home Provider: office   I discussed the limitations of evaluation and management by telemedicine and the availability of in person appointments. The patient expressed understanding and agreed to proceed.  History of Present Illness:    Observations/Objective:   Assessment and Plan:   Follow Up Instructions:    I discussed the assessment and treatment plan with the patient. The patient was provided an opportunity to ask questions and all were answered. The patient agreed with the plan and demonstrated an understanding of the instructions.   The patient was advised to call back or seek an in-person evaluation if the symptoms worsen or if the condition fails to improve as anticipated.  I provided 25 minutes of non-face-to-face time during this encounter.   Vicente Males, LPN Blood pressure medicine and blood pressure levels reviewed today with patient. Compliant with blood pressure medicine. States does not miss a dose. No obvious side effects. Blood pressure generally good when checked elsewhere. Watching salt intake.   Patient continues to take lipid medication regularly. No obvious side effects from it. Generally does not miss a dose. Prior blood work results are reviewed with patient. Patient continues to work on fat intake in diet  stomach flared up  immodium ad  took and    Review of Systems No headache, no major weight loss or weight gain, no chest pain no back pain abdominal pain no change in bowel habits complete ROS otherwise negative     Objective:   Physical Exam virtual       Assessment & Plan:  #1 memory short term loss.  Long discussion held.  Does not portend dementia.  Even no family history.  Discussed.  May use B12 if he feels it helps him  2.  Hypertension good control discussed maintain same meds  3.  Hyperlipidemia prior blood work reviewed.  Good control discussed maintain same  Follow-up in 6 months diet exercise discussed

## 2019-05-15 DIAGNOSIS — M5440 Lumbago with sciatica, unspecified side: Secondary | ICD-10-CM | POA: Diagnosis not present

## 2019-05-15 DIAGNOSIS — S32009K Unspecified fracture of unspecified lumbar vertebra, subsequent encounter for fracture with nonunion: Secondary | ICD-10-CM | POA: Diagnosis not present

## 2019-07-04 DIAGNOSIS — G894 Chronic pain syndrome: Secondary | ICD-10-CM | POA: Diagnosis not present

## 2019-07-04 NOTE — Patient Instructions (Signed)
   Your procedure is scheduled on: 07/09/2019  Report to [facility] at  10:45   AM.  Call this number if you have problems the morning of surgery: [phone]   Remember:              Follow Directions on the letter you received from Your Physician's office regarding the Bowel Prep  :  Take these medicines the morning of surgery with A SIP OF WATER: Oxycodone   Do not wear jewelry, make-up or nail polish.    Do not bring valuables to the hospital.  Contacts, dentures or bridgework may not be worn into surgery.  .   Patients discharged the day of surgery will not be allowed to drive home.     Colonoscopy, Adult, Care After This sheet gives you information about how to care for yourself after your procedure. Your health care provider may also give you more specific instructions. If you have problems or questions, contact your health care provider. What can I expect after the procedure? After the procedure, it is common to have:  A small amount of blood in your stool for 24 hours after the procedure.  Some gas.  Mild abdominal cramping or bloating.  Follow these instructions at home: General instructions   For the first 24 hours after the procedure: ? Do not drive or use machinery. ? Do not sign important documents. ? Do not drink alcohol. ? Do your regular daily activities at a slower pace than normal. ? Eat soft, easy-to-digest foods. ? Rest often.  Take over-the-counter or prescription medicines only as told by your health care provider.  It is up to you to get the results of your procedure. Ask your health care provider, or the department performing the procedure, when your results will be ready. Relieving cramping and bloating  Try walking around when you have cramps or feel bloated.  Apply heat to your abdomen as told by your health care provider. Use a heat source that your health care provider recommends, such as a moist heat pack or a heating pad. ? Place a  towel between your skin and the heat source. ? Leave the heat on for 20-30 minutes. ? Remove the heat if your skin turns bright red. This is especially important if you are unable to feel pain, heat, or cold. You may have a greater risk of getting burned. Eating and drinking  Drink enough fluid to keep your urine clear or pale yellow.  Resume your normal diet as instructed by your health care provider. Avoid heavy or fried foods that are hard to digest.  Avoid drinking alcohol for as long as instructed by your health care provider. Contact a health care provider if:  You have blood in your stool 2-3 days after the procedure. Get help right away if:  You have more than a small spotting of blood in your stool.  You pass large blood clots in your stool.  Your abdomen is swollen.  You have nausea or vomiting.  You have a fever.  You have increasing abdominal pain that is not relieved with medicine. This information is not intended to replace advice given to you by your health care provider. Make sure you discuss any questions you have with your health care provider. Document Released: 05/25/2004 Document Revised: 07/05/2016 Document Reviewed: 12/23/2015 Elsevier Interactive Patient Education  [employer].

## 2019-07-05 ENCOUNTER — Other Ambulatory Visit (HOSPITAL_COMMUNITY)
Admission: RE | Admit: 2019-07-05 | Discharge: 2019-07-05 | Disposition: A | Discharge status: Home / Self Care | Payer: Medicare Other | Source: Ambulatory Visit | Attending: Internal Medicine | Admitting: Internal Medicine

## 2019-07-05 ENCOUNTER — Encounter (HOSPITAL_COMMUNITY)
Admission: RE | Admit: 2019-07-05 | Discharge: 2019-07-05 | Disposition: A | Discharge status: Home / Self Care | Payer: Medicare Other | Source: Ambulatory Visit | Attending: Internal Medicine | Admitting: Internal Medicine

## 2019-07-05 ENCOUNTER — Other Ambulatory Visit: Payer: Self-pay

## 2019-07-05 DIAGNOSIS — Z20828 Contact with and (suspected) exposure to other viral communicable diseases: Secondary | ICD-10-CM | POA: Diagnosis not present

## 2019-07-05 DIAGNOSIS — Z01812 Encounter for preprocedural laboratory examination: Secondary | ICD-10-CM | POA: Diagnosis not present

## 2019-07-05 LAB — BASIC METABOLIC PANEL
Anion gap: 10 (ref 5–15)
BUN: 27 mg/dL — ABNORMAL HIGH (ref 8–23)
CO2: 22 mmol/L (ref 22–32)
Calcium: 8.7 mg/dL — ABNORMAL LOW (ref 8.9–10.3)
Chloride: 104 mmol/L (ref 98–111)
Creatinine, Ser: 1.2 mg/dL (ref 0.61–1.24)
GFR calc Af Amer: >60 mL/min (ref >60)
GFR calc non Af Amer: >60 mL/min (ref >60)
Glucose, Bld: 124 mg/dL — ABNORMAL HIGH (ref 70–99)
Potassium: 3.8 mmol/L (ref 3.5–5.1)
Sodium: 136 mmol/L (ref 135–145)

## 2019-07-05 LAB — SARS CORONAVIRUS 2 (TAT 6-24 HRS): SARS Coronavirus 2: NEGATIVE

## 2019-07-09 ENCOUNTER — Encounter (HOSPITAL_COMMUNITY)
Admission: RE | Disposition: A | Discharge status: Home / Self Care | Payer: Self-pay | Source: Home / Self Care | Attending: Internal Medicine

## 2019-07-09 ENCOUNTER — Other Ambulatory Visit: Payer: Self-pay

## 2019-07-09 ENCOUNTER — Ambulatory Visit (HOSPITAL_COMMUNITY): Payer: Medicare Other | Admitting: Anesthesiology

## 2019-07-09 ENCOUNTER — Encounter (HOSPITAL_COMMUNITY): Payer: Self-pay | Admitting: Anesthesiology

## 2019-07-09 ENCOUNTER — Ambulatory Visit (HOSPITAL_COMMUNITY)
Admission: RE | Admit: 2019-07-09 | Discharge: 2019-07-09 | Disposition: A | Discharge status: Home / Self Care | Payer: Medicare Other | Attending: Internal Medicine | Admitting: Internal Medicine

## 2019-07-09 DIAGNOSIS — Z87442 Personal history of urinary calculi: Secondary | ICD-10-CM | POA: Insufficient documentation

## 2019-07-09 DIAGNOSIS — F419 Anxiety disorder, unspecified: Secondary | ICD-10-CM | POA: Insufficient documentation

## 2019-07-09 DIAGNOSIS — G4700 Insomnia, unspecified: Secondary | ICD-10-CM | POA: Insufficient documentation

## 2019-07-09 DIAGNOSIS — K635 Polyp of colon: Secondary | ICD-10-CM | POA: Diagnosis not present

## 2019-07-09 DIAGNOSIS — F329 Major depressive disorder, single episode, unspecified: Secondary | ICD-10-CM | POA: Diagnosis not present

## 2019-07-09 DIAGNOSIS — Z1211 Encounter for screening for malignant neoplasm of colon: Secondary | ICD-10-CM | POA: Diagnosis not present

## 2019-07-09 DIAGNOSIS — Z981 Arthrodesis status: Secondary | ICD-10-CM | POA: Insufficient documentation

## 2019-07-09 DIAGNOSIS — K640 First degree hemorrhoids: Secondary | ICD-10-CM | POA: Insufficient documentation

## 2019-07-09 DIAGNOSIS — K5730 Diverticulosis of large intestine without perforation or abscess without bleeding: Secondary | ICD-10-CM | POA: Insufficient documentation

## 2019-07-09 DIAGNOSIS — M549 Dorsalgia, unspecified: Secondary | ICD-10-CM | POA: Insufficient documentation

## 2019-07-09 DIAGNOSIS — Z9049 Acquired absence of other specified parts of digestive tract: Secondary | ICD-10-CM | POA: Diagnosis not present

## 2019-07-09 DIAGNOSIS — K219 Gastro-esophageal reflux disease without esophagitis: Secondary | ICD-10-CM | POA: Insufficient documentation

## 2019-07-09 DIAGNOSIS — Z880 Allergy status to penicillin: Secondary | ICD-10-CM | POA: Insufficient documentation

## 2019-07-09 DIAGNOSIS — R351 Nocturia: Secondary | ICD-10-CM | POA: Insufficient documentation

## 2019-07-09 DIAGNOSIS — E876 Hypokalemia: Secondary | ICD-10-CM | POA: Diagnosis not present

## 2019-07-09 DIAGNOSIS — M797 Fibromyalgia: Secondary | ICD-10-CM | POA: Diagnosis not present

## 2019-07-09 DIAGNOSIS — I2510 Atherosclerotic heart disease of native coronary artery without angina pectoris: Secondary | ICD-10-CM | POA: Diagnosis not present

## 2019-07-09 DIAGNOSIS — R197 Diarrhea, unspecified: Secondary | ICD-10-CM

## 2019-07-09 DIAGNOSIS — Z87891 Personal history of nicotine dependence: Secondary | ICD-10-CM | POA: Insufficient documentation

## 2019-07-09 DIAGNOSIS — J45909 Unspecified asthma, uncomplicated: Secondary | ICD-10-CM | POA: Insufficient documentation

## 2019-07-09 DIAGNOSIS — D123 Benign neoplasm of transverse colon: Secondary | ICD-10-CM | POA: Diagnosis not present

## 2019-07-09 DIAGNOSIS — M1990 Unspecified osteoarthritis, unspecified site: Secondary | ICD-10-CM | POA: Insufficient documentation

## 2019-07-09 DIAGNOSIS — G8929 Other chronic pain: Secondary | ICD-10-CM | POA: Diagnosis not present

## 2019-07-09 DIAGNOSIS — Z951 Presence of aortocoronary bypass graft: Secondary | ICD-10-CM | POA: Diagnosis not present

## 2019-07-09 DIAGNOSIS — I10 Essential (primary) hypertension: Secondary | ICD-10-CM | POA: Insufficient documentation

## 2019-07-09 DIAGNOSIS — K5900 Constipation, unspecified: Secondary | ICD-10-CM | POA: Diagnosis not present

## 2019-07-09 DIAGNOSIS — E785 Hyperlipidemia, unspecified: Secondary | ICD-10-CM | POA: Diagnosis not present

## 2019-07-09 DIAGNOSIS — Z79899 Other long term (current) drug therapy: Secondary | ICD-10-CM | POA: Insufficient documentation

## 2019-07-09 DIAGNOSIS — Z96652 Presence of left artificial knee joint: Secondary | ICD-10-CM | POA: Diagnosis not present

## 2019-07-09 DIAGNOSIS — Z7982 Long term (current) use of aspirin: Secondary | ICD-10-CM | POA: Insufficient documentation

## 2019-07-09 DIAGNOSIS — G709 Myoneural disorder, unspecified: Secondary | ICD-10-CM | POA: Insufficient documentation

## 2019-07-09 HISTORY — PX: COLONOSCOPY WITH PROPOFOL: SHX5780

## 2019-07-09 HISTORY — PX: POLYPECTOMY: SHX5525

## 2019-07-09 SURGERY — COLONOSCOPY WITH PROPOFOL
Anesthesia: General

## 2019-07-09 MED ORDER — KETAMINE HCL 50 MG/5ML IJ SOSY
PREFILLED_SYRINGE | INTRAMUSCULAR | Status: AC
  Filled 2019-07-09: qty 5

## 2019-07-09 MED ORDER — LACTATED RINGERS IV SOLN
INTRAVENOUS | Status: DC | PRN
  Administered 2019-07-09: 12:00:00 via INTRAVENOUS

## 2019-07-09 MED ORDER — KETAMINE HCL 10 MG/ML IJ SOLN
INTRAMUSCULAR | Status: DC | PRN
  Administered 2019-07-09: 20 mg via INTRAVENOUS
  Administered 2019-07-09: 10 mg via INTRAVENOUS

## 2019-07-09 MED ORDER — ONDANSETRON HCL 4 MG/2ML IJ SOLN: 4.0000 mg | Freq: Once | INTRAMUSCULAR | Status: DC | PRN

## 2019-07-09 MED ORDER — PHENYLEPHRINE HCL (PRESSORS) 10 MG/ML IV SOLN
INTRAVENOUS | Status: DC | PRN
  Administered 2019-07-09 (×2): 80 ug via INTRAVENOUS

## 2019-07-09 MED ORDER — PROPOFOL 10 MG/ML IV BOLUS
INTRAVENOUS | Status: DC | PRN
  Administered 2019-07-09: 10 mg via INTRAVENOUS

## 2019-07-09 MED ORDER — CHLORHEXIDINE GLUCONATE CLOTH 2 % EX PADS: 6.0000 | MEDICATED_PAD | Freq: Once | CUTANEOUS | Status: DC

## 2019-07-09 MED ORDER — PROPOFOL 500 MG/50ML IV EMUL
INTRAVENOUS | Status: DC | PRN
  Administered 2019-07-09: 12:00:00 via INTRAVENOUS
  Administered 2019-07-09: 150 ug/kg/min via INTRAVENOUS

## 2019-07-09 NOTE — Anesthesia Procedure Notes (Signed)
Procedure Name: General with mask airway Date/Time: 07/09/2019 11:43 AM Performed by: Andree Elk, Amy A, CRNA Pre-anesthesia Checklist: Timeout performed, Patient being monitored, Suction available, Emergency Drugs available and Patient identified Oxygen Delivery Method: Non-rebreather mask

## 2019-07-09 NOTE — H&P (Signed)
  Colonoscopy Discharge Instructions  Read the instructions outlined below and refer to this sheet in the next few weeks. These discharge instructions provide you with general information on caring for yourself after you leave the hospital. Your doctor may also give you specific instructions. While your treatment has been planned according to the most current medical practices available, unavoidable complications occasionally occur. If you have any problems or questions after discharge, call Dr. Gala Romney at [phone]. ACTIVITY  You may resume your regular activity, but move at a slower pace for the next 24 hours.   Take frequent rest periods for the next 24 hours.   Walking will help get rid of the air and reduce the bloated feeling in your belly (abdomen).   No driving for 24 hours (because of the medicine (anesthesia) used during the test).    Do not sign any important legal documents or operate any machinery for 24 hours (because of the anesthesia used during the test).  NUTRITION  Drink plenty of fluids.   You may resume your normal diet as instructed by your doctor.   Begin with a light meal and progress to your normal diet. Heavy or fried foods are harder to digest and may make you feel sick to your stomach (nauseated).   Avoid alcoholic beverages for 24 hours or as instructed.  MEDICATIONS  You may resume your normal medications unless your doctor tells you otherwise.  WHAT YOU CAN EXPECT TODAY  Some feelings of bloating in the abdomen.   Passage of more gas than usual.   Spotting of blood in your stool or on the toilet paper.  IF YOU HAD POLYPS REMOVED DURING THE COLONOSCOPY:  No aspirin products for 7 days or as instructed.   No alcohol for 7 days or as instructed.   Eat a soft diet for the next 24 hours.  FINDING OUT THE RESULTS OF YOUR TEST Not all test results are available during your visit. If your test results are not back during the visit, make an appointment  with your caregiver to find out the results. Do not assume everything is normal if you have not heard from your caregiver or the medical facility. It is important for you to follow up on all of your test results.  SEEK IMMEDIATE MEDICAL ATTENTION IF:  You have more than a spotting of blood in your stool.   Your belly is swollen (abdominal distention).   You are nauseated or vomiting.   You have a temperature over 101.   You have abdominal pain or discomfort that is severe or gets worse throughout the day.   Further recommendations to follow pending review of pathology report

## 2019-07-09 NOTE — Anesthesia Preprocedure Evaluation (Signed)
Anesthesia Evaluation  Patient identified by MRN, date of birth, ID band Patient awake    Reviewed: Allergy & Precautions, NPO status , Patient's Chart, lab work & pertinent test results  Airway Mallampati: III  TM Distance: >3 FB Neck ROM: Full    Dental  (+)    Pulmonary asthma , pneumonia, resolved, former smoker,    Pulmonary exam normal        Cardiovascular hypertension, + CAD and + CABG  Normal cardiovascular exam     Neuro/Psych PSYCHIATRIC DISORDERS Anxiety Depression  Neuromuscular disease    GI/Hepatic GERD  ,  Endo/Other  Morbid obesity  Renal/GU      Musculoskeletal  (+) Arthritis , Osteoarthritis,  Fibromyalgia -  Abdominal   Peds  Hematology   Anesthesia Other Findings   Reproductive/Obstetrics                             Anesthesia Physical Anesthesia Plan  ASA: III  Anesthesia Plan: General   Post-op Pain Management:    Induction: Intravenous  PONV Risk Score and Plan:   Airway Management Planned: Simple Face Mask, Nasal Cannula and Natural Airway  Additional Equipment:   Intra-op Plan:   Post-operative Plan:   Informed Consent: I have reviewed the patients History and Physical, chart, labs and discussed the procedure including the risks, benefits and alternatives for the proposed anesthesia with the patient or authorized representative who has indicated his/her understanding and acceptance.     Dental advisory given  Plan Discussed with: CRNA  Anesthesia Plan Comments: (Possible GA with ETT was discussed)        Anesthesia Quick Evaluation

## 2019-07-09 NOTE — Transfer of Care (Signed)
Immediate Anesthesia Transfer of Care Note  Patient: Michael Barr  Procedure(s) Performed: COLONOSCOPY WITH PROPOFOL (N/A ) POLYPECTOMY  Patient Location: PACU  Anesthesia Type:MAC  Level of Consciousness: awake, alert  and patient cooperative  Airway & Oxygen Therapy: Patient Spontanous Breathing  Post-op Assessment: Report given to RN and Post -op Vital signs reviewed and stable  Post vital signs: Reviewed and stable  Last Vitals:  Vitals Value Taken Time  BP 101/64 07/09/19 1217  Temp    Pulse 82 07/09/19 1219  Resp 14 07/09/19 1219  SpO2 97 % 07/09/19 1219  Vitals shown include unvalidated device data.  Last Pain:  Vitals:   07/09/19 1124  TempSrc: Oral  PainSc: 7          Complications: No apparent anesthesia complications

## 2019-07-09 NOTE — Discharge Instructions (Addendum)
Colonoscopy Discharge Instructions  Read the instructions outlined below and refer to this sheet in the next few weeks. These discharge instructions provide you with general information on caring for yourself after you leave the hospital. Your doctor may also give you specific instructions. While your treatment has been planned according to the most current medical practices available, unavoidable complications occasionally occur. If you have any problems or questions after discharge, call Dr. Gala Romney at [phone]. ACTIVITY  You may resume your regular activity, but move at a slower pace for the next 24 hours.   Take frequent rest periods for the next 24 hours.   Walking will help get rid of the air and reduce the bloated feeling in your belly (abdomen).   No driving for 24 hours (because of the medicine (anesthesia) used during the test).    Do not sign any important legal documents or operate any machinery for 24 hours (because of the anesthesia used during the test).  NUTRITION  Drink plenty of fluids.   You may resume your normal diet as instructed by your doctor.   Begin with a light meal and progress to your normal diet. Heavy or fried foods are harder to digest and may make you feel sick to your stomach (nauseated).   Avoid alcoholic beverages for 24 hours or as instructed.  MEDICATIONS  You may resume your normal medications unless your doctor tells you otherwise.  WHAT YOU CAN EXPECT TODAY  Some feelings of bloating in the abdomen.   Passage of more gas than usual.   Spotting of blood in your stool or on the toilet paper.  IF YOU HAD POLYPS REMOVED DURING THE COLONOSCOPY:  No aspirin products for 7 days or as instructed.   No alcohol for 7 days or as instructed.   Eat a soft diet for the next 24 hours.  FINDING OUT THE RESULTS OF YOUR TEST Not all test results are available during your visit. If your test results are not back during the visit, make an appointment  with your caregiver to find out the results. Do not assume everything is normal if you have not heard from your caregiver or the medical facility. It is important for you to follow up on all of your test results.  SEEK IMMEDIATE MEDICAL ATTENTION IF:  You have more than a spotting of blood in your stool.   Your belly is swollen (abdominal distention).   You are nauseated or vomiting.   You have a temperature over 101.   You have abdominal pain or discomfort that is severe or gets worse throughout the day.   Diverticulosis information provided  Polyp information provided  Further recommendations to follow pending review of pathology report  At patient's request, I called Michael Barr at [phone] and left a message after getting voicemail.  Diverticulosis  Diverticulosis is a condition that develops when small pouches (diverticula) form in the wall of the large intestine (colon). The colon is where water is absorbed and stool is formed. The pouches form when the inside layer of the colon pushes through weak spots in the outer layers of the colon. You may have a few pouches or many of them. What are the causes? The cause of this condition is not known. What increases the risk? The following factors may make you more likely to develop this condition:  Being older than age 27. Your risk for this condition increases with age. Diverticulosis is rare among people younger than age 28. By age 94,  many people have it.  Eating a low-fiber diet.  Having frequent constipation.  Being overweight.  Not getting enough exercise.  Smoking.  Taking over-the-counter pain medicines, like aspirin and ibuprofen.  Having a family history of diverticulosis. What are the signs or symptoms? In most people, there are no symptoms of this condition. If you do have symptoms, they may include:  Bloating.  Cramps in the abdomen.  Constipation or diarrhea.  Pain in the lower left side of the  abdomen. How is this diagnosed? This condition is most often diagnosed during an exam for other colon problems. Because diverticulosis usually has no symptoms, it often cannot be diagnosed independently. This condition may be diagnosed by:  Using a flexible scope to examine the colon (colonoscopy).  Taking an X-ray of the colon after dye has been put into the colon (barium enema).  Doing a CT scan. How is this treated? You may not need treatment for this condition if you have never developed an infection related to diverticulosis. If you have had an infection before, treatment may include:  Eating a high-fiber diet. This may include eating more fruits, vegetables, and grains.  Taking a fiber supplement.  Taking a live bacteria supplement (probiotic).  Taking medicine to relax your colon.  Taking antibiotic medicines. Follow these instructions at home:  Drink 6-8 glasses of water or more each day to prevent constipation.  Try not to strain when you have a bowel movement.  If you have had an infection before: ? Eat more fiber as directed by your health care provider or your diet and nutrition specialist (dietitian). ? Take a fiber supplement or probiotic, if your health care provider approves.  Take over-the-counter and prescription medicines only as told by your health care provider.  If you were prescribed an antibiotic, take it as told by your health care provider. Do not stop taking the antibiotic even if you start to feel better.  Keep all follow-up visits as told by your health care provider. This is important. Contact a health care provider if:  You have pain in your abdomen.  You have bloating.  You have cramps.  You have not had a bowel movement in 3 days. Get help right away if:  Your pain gets worse.  Your bloating becomes very bad.  You have a fever or chills, and your symptoms suddenly get worse.  You vomit.  You have bowel movements that are bloody  or black.  You have bleeding from your rectum. Summary  Diverticulosis is a condition that develops when small pouches (diverticula) form in the wall of the large intestine (colon).  You may have a few pouches or many of them.  This condition is most often diagnosed during an exam for other colon problems.  If you have had an infection related to diverticulosis, treatment may include increasing the fiber in your diet, taking supplements, or taking medicines. This information is not intended to replace advice given to you by your health care provider. Make sure you discuss any questions you have with your health care provider. Document Released: 07/08/2004 Document Revised: 09/23/2017 Document Reviewed: 08/30/2016 Elsevier Patient Education  2020 [employer].

## 2019-07-09 NOTE — H&P (Signed)
@LOGO @   Primary Care Physician:  Mikey Kirschner, MD Primary Gastroenterologist:  Dr. Gala Romney Pre-Procedure History & Physical: HPI:  Michael Barr is a 73 y.o. male here for further evaluation of chronic diarrhea via colonoscopy.  Patient states diarrhea is pretty much gotten much better.  Last colonoscopy distant past.  Of late, one more colonoscopy today.  Past Medical History:  Diagnosis Date  . Anxiety    takes Xanax and Methadone daily  . Arthritis    low back- DDD  . Asthma 70's  . Cataract    immature and to the left eye  . Chronic back pain    HNP  . Constipation    takes Sennokot nightly  . Coronary artery disease   . Depression    attacks  . Fibromyalgia   . GERD (gastroesophageal reflux disease)    20 YRS AGO, AND DIET CONTROL  . History of kidney stones   . Hyperlipidemia    takes Zocor daily  . Hypertension    takes Zestoretic daily  . Hypokalemia    takes Potassium daily  . Insomnia    takes trazodone nightly  . Joint pain   . Joint swelling   . Nocturia   . Pneumonia 70's  . Weakness    right leg    Past Surgical History:  Procedure Laterality Date  . APPLICATION OF INTRAOPERATIVE CT SCAN N/A 08/21/2018   Procedure: APPLICATION OF INTRAOPERATIVE CT SCAN;  Surgeon: Kary Kos, MD;  Location: [facility];  Service: Neurosurgery;  Laterality: N/A;  . BACK SURGERY     multiple back surgeries  . CARDIAC CATHETERIZATION     unsure of how long ago  . CARPAL TUNNEL RELEASE Bilateral 70's  . CHOLECYSTECTOMY  1985  . COLONOSCOPY    . CORONARY ARTERY BYPASS GRAFT  1996   1 vessel  . HARDWARE REMOVAL N/A 03/26/2016   Procedure: HARDWARE REMOVAL Lumbar one-two ;  Surgeon: Kary Kos, MD;  Location: [facility];  Service: Neurosurgery;  Laterality: N/A;  . JOINT REPLACEMENT  1999   lt knee  . KNEE ARTHROSCOPY Right   . LUMBAR LAMINECTOMY/DECOMPRESSION MICRODISCECTOMY Right 09/26/2013   Procedure: LUMBAR LAMINECTOMY/DECOMPRESSION MICRODISCECTOMY RIGHT  THREE-FOUR;  Surgeon: Elaina Hoops, MD;  Location: [facility];  Service: Neurosurgery;  Laterality: Right;  . pilondial cyst     x 2   . POSTERIOR LUMBAR FUSION 4 LEVEL N/A 08/21/2018   Procedure: Posterior Lateral Fusion Thoracic Eight to Pelvis with Iliac screws and Fenestrated screws with kyphon and AIRO;  Surgeon: Kary Kos, MD;  Location: [facility];  Service: Neurosurgery;  Laterality: N/A;  . SHOULDER ARTHROSCOPY Bilateral    left x 2 and right x 1  . TONSILLECTOMY    . tumor removed from right ankle   70's    Prior to Admission medications   Medication Sig Start Date End Date Taking? Authorizing Provider  acetaminophen (TYLENOL) 500 MG tablet Take 500 mg by mouth 3 (three) times daily.    Yes [provider]  ALPRAZolam (XANAX) 0.25 MG tablet Take 0.25 mg by mouth 3 (three) times daily. 04/15/19  Yes [provider]  aspirin EC 325 MG tablet Take 325 mg by mouth daily.   Yes [provider]  diphenhydrAMINE (BENADRYL) 25 mg capsule Take 25 mg by mouth at bedtime as needed for itching or allergies.   Yes [provider]  gabapentin (NEURONTIN) 300 MG capsule Take 300 mg by mouth See admin instructions. Take  1 capsule (300 mg) by mouth up to 3 times daily (typically takes 2 times daily)   Yes [provider]  lisinopril-hydrochlorothiazide (ZESTORETIC) 20-25 MG tablet Take 1 tablet by mouth every morning. 05/07/19  Yes Mikey Kirschner, MD  methadone (DOLOPHINE) 5 MG tablet Take 5 mg by mouth 3 (three) times daily. 04/15/19  Yes [provider]  Multiple Vitamins-Minerals (MULTIVITAMIN WITH MINERALS) tablet Take 1 tablet by mouth daily.   Yes [provider]  Oxycodone HCl 10 MG TABS Take 10 mg by mouth 3 (three) times daily.   Yes [provider]  polyethylene glycol-electrolytes (TRILYTE) 420 g solution Take 4,000 mLs by mouth as directed. 05/02/19  Yes Rourk, Cristopher Estimable, MD  Potassium 99 MG TABS Take 99 mg by mouth daily  as needed (fatigue).    Yes [provider]  Propylhexedrine (BENZEDREX NA) Place 1 Inhaler into the nose as needed (congestion.).   Yes [provider]  senna (SENOKOT) 8.6 MG TABS tablet Take 1-2 tablets by mouth daily as needed (constipation.).    Yes [provider]  simvastatin (ZOCOR) 40 MG tablet Take 1 tablet (40 mg total) by mouth at bedtime. 05/07/19  Yes Mikey Kirschner, MD  traZODone (DESYREL) 100 MG tablet Take 250 mg by mouth at bedtime.    Yes [provider]  nitroGLYCERIN (NITROSTAT) 0.4 MG SL tablet Place 0.4 mg under the tongue every 5 (five) minutes as needed for chest pain.    [provider]    Allergies as of 05/02/2019 - Review Complete 05/02/2019  Allergen Reaction Noted  . Penicillins Rash and Other (See Comments) 09/18/2013    Family History  Problem Relation Age of Onset  . Colon cancer Neg Hx     Social History   Socioeconomic History  . Marital status: Married    Spouse name: Not on file  . Number of children: Not on file  . Years of education: Not on file  . Highest education level: Not on file  Occupational History  . Not on file  Social Needs  . Financial resource strain: Not on file  . Food insecurity    Worry: Not on file    Inability: Not on file  . Transportation needs    Medical: Not on file    Non-medical: Not on file  Tobacco Use  . Smoking status: Former Research scientist (life sciences)  . Smokeless tobacco: Never Used  . Tobacco comment: quit smoking early 70's  Substance and Sexual Activity  . Alcohol use: No  . Drug use: No  . Sexual activity: Not Currently  Lifestyle  . Physical activity    Days per week: Not on file    Minutes per session: Not on file  . Stress: Not on file  Relationships  . Social Herbalist on phone: Not on file    Gets together: Not on file    Attends religious service: Not on file    Active member of club or organization: Not on file    Attends meetings of clubs or  organizations: Not on file    Relationship status: Not on file  . Intimate partner violence    Fear of current or ex partner: Not on file    Emotionally abused: Not on file    Physically abused: Not on file    Forced sexual activity: Not on file  Other Topics Concern  . Not on file  Social History Narrative  . Not on  file    Review of Systems: See HPI, otherwise negative ROS  Physical Exam: Pulse 91   Temp 98.5 F (36.9 C) (Oral)   Resp 16   SpO2 96%  General:   Alert,  Well-developed, well-nourished, pleasant and cooperative in NAD Neck:  Supple; no masses or thyromegaly. No significant cervical adenopathy. Lungs:  Clear throughout to auscultation.   No wheezes, crackles, or rhonchi. No acute distress. Heart:  Regular rate and rhythm; no murmurs, clicks, rubs,  or gallops. Abdomen: Non-distended, normal bowel sounds.  Soft and nontender without appreciable mass or hepatosplenomegaly.  Pulses:  Normal pulses noted. Extremities:  Without clubbing or edema.  Impression/Plan: 73 year old gentleman with chronic diarrhea which has improved, essentially resolved over the past several weeks.  Colonoscopy distant past.  Colonoscopy today per plan.  The risks, benefits, limitations, alternatives and imponderables have been reviewed with the patient. Questions have been answered. All parties are agreeable.      Notice: This dictation was prepared with Dragon dictation along with smaller phrase technology. Any transcriptional errors that result from this process are unintentional and may not be corrected upon review.

## 2019-07-09 NOTE — Op Note (Signed)
[facility] Patient Name: Michael Barr Procedure Date: 07/09/2019 11:20 AM MRN: [id number] Date of Birth: 1945/11/26 Attending MD: Norvel Richards , MD CSN: [id number] Age: 73 Admit Type: Outpatient Procedure:                Colonoscopy Indications:              Screening for colorectal malignant neoplasm;                            diarrhea resolved per pt. Providers:                Norvel Richards, MD, Gwynneth Albright RN,                            RN, Raphael Gibney, Technician Referring MD:              Medicines:                Propofol per Anesthesia Complications:            No immediate complications. Estimated Blood Loss:     Estimated blood loss was minimal. Procedure:                Pre-Anesthesia Assessment:                           - Prior to the procedure, a History and Physical                            was performed, and patient medications and                            allergies were reviewed. The patient's tolerance of                            previous anesthesia was also reviewed. The risks                            and benefits of the procedure and the sedation                            options and risks were discussed with the patient.                            All questions were answered, and informed consent                            was obtained. Prior Anticoagulants: The patient has                            taken no previous anticoagulant or antiplatelet                            agents. ASA Grade Assessment: III - A patient with  severe systemic disease. After reviewing the risks                            and benefits, the patient was deemed in                            satisfactory condition to undergo the procedure.                           After obtaining informed consent, the colonoscope                            was passed under direct vision. Throughout the   procedure, the patient's blood pressure, pulse, and                            oxygen saturations were monitored continuously. The                            [id number] scope was introduced through                            the anus and advanced to the the cecum, identified                            by appendiceal orifice and ileocecal valve. The                            colonoscopy was performed without difficulty. The                            patient tolerated the procedure well. The quality                            of the bowel preparation was adequate. The                            ileocecal valve, appendiceal orifice, and rectum                            were photographed. The entire colon was well                            visualized. Scope In: 11:50:36 AM Scope Out: 12:10:12 PM Scope Withdrawal Time: 0 hours 9 minutes 26 seconds  Total Procedure Duration: 0 hours 19 minutes 36 seconds  Findings:      The perianal and digital rectal examinations were normal.      Three sessile polyps were found in the transverse colon. The polyps were       3 to 5 mm in size. These polyps were removed with a cold snare.       Resection and retrieval were complete. Estimated blood loss was minimal.      Scattered medium-mouthed diverticula were found in the sigmoid colon and       descending colon.  Non-bleeding internal hemorrhoids were found during retroflexion. The       hemorrhoids were mild, small and Grade I (internal hemorrhoids that do       not prolapse).      The exam was otherwise without abnormality on direct and retroflexion       views. Impression:               - Three 3 to 5 mm polyps in the transverse colon,                            removed with a cold snare. Resected and retrieved.                           - Diverticulosis in the sigmoid colon and in the                            descending colon.                           - Non-bleeding internal  hemorrhoids.                           - The examination was otherwise normal on direct                            and retroflexion views. Moderate Sedation:      Moderate (conscious) sedation was personally administered by an       anesthesia professional. The following parameters were monitored: oxygen       saturation, heart rate, blood pressure, respiratory rate, EKG, adequacy       of pulmonary ventilation, and response to care. Recommendation:           - Patient has a contact number available for                            emergencies. The signs and symptoms of potential                            delayed complications were discussed with the                            patient. Return to normal activities tomorrow.                            Written discharge instructions were provided to the                            patient.                           - Resume previous diet.                           - Continue present medications.                           -  Repeat colonoscopy date to be determined after                            pending pathology results are reviewed for                            surveillance based on pathology results.                           - Return to GI office (date not yet determined). Procedure Code(s):        --- Professional ---                           [phone], Colonoscopy, flexible; with removal of                            tumor(s), polyp(s), or other lesion(s) by snare                            technique Diagnosis Code(s):        --- Professional ---                           Z12.11, Encounter for screening for malignant                            neoplasm of colon                           K63.5, Polyp of colon                           K64.0, First degree hemorrhoids                           K57.30, Diverticulosis of large intestine without                            perforation or abscess without bleeding CPT copyright 2019 American  Medical Association. All rights reserved. The codes documented in this report are preliminary and upon coder review may  be revised to meet current compliance requirements. Cristopher Estimable. Keonta Alsip, MD Norvel Richards, MD 07/09/2019 12:22:25 PM This report has been signed electronically. Number of Addenda: 0

## 2019-07-09 NOTE — Anesthesia Postprocedure Evaluation (Signed)
Anesthesia Post Note  Patient: Michael Barr  Procedure(s) Performed: COLONOSCOPY WITH PROPOFOL (N/A ) POLYPECTOMY  Patient location during evaluation: PACU Anesthesia Type: MAC Level of consciousness: awake and alert Pain management: pain level controlled Vital Signs Assessment: post-procedure vital signs reviewed and stable Respiratory status: spontaneous breathing Cardiovascular status: stable Postop Assessment: no apparent nausea or vomiting Anesthetic complications: no     Last Vitals:  Vitals:   07/09/19 1135 07/09/19 1216  BP: 123/60 101/64  Pulse:  82  Resp:  14  Temp:  36.8 C  SpO2:  97%    Last Pain:  Vitals:   07/09/19 1216  TempSrc:   PainSc: 4                  Everette Rank

## 2019-07-10 NOTE — Addendum Note (Signed)
Addendum  created 07/10/19 0953 by Mickel Baas, CRNA   Charge Capture section accepted

## 2019-07-11 ENCOUNTER — Encounter: Payer: Self-pay | Admitting: Internal Medicine

## 2019-07-11 ENCOUNTER — Encounter (HOSPITAL_COMMUNITY): Payer: Self-pay | Admitting: Internal Medicine

## 2019-07-12 ENCOUNTER — Telehealth: Payer: Self-pay | Admitting: Family Medicine

## 2019-07-12 MED ORDER — LISINOPRIL-HYDROCHLOROTHIAZIDE 20-25 MG PO TABS: 1.0000 | ORAL_TABLET | Freq: Every morning | ORAL | 0 refills | Status: DC

## 2019-07-12 MED ORDER — SIMVASTATIN 40 MG PO TABS: 40.0000 mg | ORAL_TABLET | Freq: Every day | ORAL | 0 refills | Status: DC

## 2019-07-12 NOTE — Telephone Encounter (Signed)
Prescription sent electronically to the pharmacy. Patient notified. 

## 2019-07-12 NOTE — Telephone Encounter (Signed)
Pt had 6 month med check in July - needs refills on lisinopril-hydrochlorothiazide (ZESTORETIC) 20-25 MG tablet & simvastatin (ZOCOR) 40 MG tablet   Please advise & call pt when done    [facility]

## 2019-08-10 DIAGNOSIS — Z23 Encounter for immunization: Secondary | ICD-10-CM | POA: Diagnosis not present

## 2019-10-03 DIAGNOSIS — Z79891 Long term (current) use of opiate analgesic: Secondary | ICD-10-CM | POA: Diagnosis not present

## 2019-10-03 DIAGNOSIS — Z5181 Encounter for therapeutic drug level monitoring: Secondary | ICD-10-CM | POA: Diagnosis not present

## 2019-10-03 DIAGNOSIS — Z79899 Other long term (current) drug therapy: Secondary | ICD-10-CM | POA: Diagnosis not present

## 2019-10-03 DIAGNOSIS — F419 Anxiety disorder, unspecified: Secondary | ICD-10-CM | POA: Diagnosis not present

## 2019-10-03 DIAGNOSIS — M47817 Spondylosis without myelopathy or radiculopathy, lumbosacral region: Secondary | ICD-10-CM | POA: Diagnosis not present

## 2019-10-03 DIAGNOSIS — G894 Chronic pain syndrome: Secondary | ICD-10-CM | POA: Diagnosis not present

## 2019-10-07 ENCOUNTER — Other Ambulatory Visit: Payer: Self-pay | Admitting: Family Medicine

## 2019-10-08 NOTE — Telephone Encounter (Signed)
Please schedule visit and then route back to nurses to send in one refill

## 2019-10-09 NOTE — Telephone Encounter (Signed)
Pt schedule for virtual 12/21

## 2019-10-15 ENCOUNTER — Ambulatory Visit (INDEPENDENT_AMBULATORY_CARE_PROVIDER_SITE_OTHER): Payer: Medicare Other | Admitting: Family Medicine

## 2019-10-15 ENCOUNTER — Other Ambulatory Visit: Payer: Self-pay

## 2019-10-15 DIAGNOSIS — I2584 Coronary atherosclerosis due to calcified coronary lesion: Secondary | ICD-10-CM

## 2019-10-15 DIAGNOSIS — Z125 Encounter for screening for malignant neoplasm of prostate: Secondary | ICD-10-CM | POA: Diagnosis not present

## 2019-10-15 DIAGNOSIS — I2510 Atherosclerotic heart disease of native coronary artery without angina pectoris: Secondary | ICD-10-CM | POA: Diagnosis not present

## 2019-10-15 DIAGNOSIS — E785 Hyperlipidemia, unspecified: Secondary | ICD-10-CM | POA: Diagnosis not present

## 2019-10-15 DIAGNOSIS — I10 Essential (primary) hypertension: Secondary | ICD-10-CM

## 2019-10-15 MED ORDER — LISINOPRIL-HYDROCHLOROTHIAZIDE 20-25 MG PO TABS: ORAL_TABLET | ORAL | 1 refills | Status: DC

## 2019-10-15 MED ORDER — NITROGLYCERIN 0.4 MG SL SUBL: 0.4000 mg | SUBLINGUAL_TABLET | SUBLINGUAL | 0 refills | Status: DC | PRN

## 2019-10-15 NOTE — Progress Notes (Signed)
   Subjective:    Patient ID: Michael Barr, male    DOB: March 29, 1946, 73 y.o.   MRN: [id number]  Hypertension This is a chronic problem. The current episode started more than 1 year ago. Risk factors for coronary artery disease include dyslipidemia. Treatments tried: lisinopril/hctz. There are no compliance problems.     Patient concerned about a lot of pain he is still having- patient states he had back surgery a year ago in October and they told him it take a year to heal but it has been a year and he is still having a lot of pain.  Virtual Visit via Video Note  I connected with [name] on 10/15/19 at  2:00 PM EST by a video enabled telemedicine application and verified that I am speaking with the correct person using two identifiers.  Location: Patient: home Provider: office   I discussed the limitations of evaluation and management by telemedicine and the availability of in person appointments. The patient expressed understanding and agreed to proceed.  History of Present Illness:    Observations/Objective:   Assessment and Plan:   Follow Up Instructions:    I discussed the assessment and treatment plan with the patient. The patient was provided an opportunity to ask questions and all were answered. The patient agreed with the plan and demonstrated an understanding of the instructions.   The patient was advised to call back or seek an in-person evaluation if the symptoms worsen or if the condition fails to improve as anticipated.  I provided 53minutes of non-face-to-face time during this encounter.  Blood pressure medicine and blood pressure levels reviewed today with patient. Compliant with blood pressure medicine. States does not miss a dose. No obvious side effects. Blood pressure generally good when checked elsewhere. Watching salt intake.  Blood pressures overall good when checked elsewhere.  Review of Systems No cough no congestion no fever no headache  no sore throat    Objective:   Physical Exam  Virtual      Assessment & Plan:  Impression hypertension.  Good control discussed  2.  Hyperlipidemia exact status uncertain discussed definitely time for blood work  Medications refilled.  Diet exercise discussed.  Appropriate blood work.  Follow-up every 6 months

## 2019-11-06 DIAGNOSIS — M25561 Pain in right knee: Secondary | ICD-10-CM | POA: Diagnosis not present

## 2019-11-06 DIAGNOSIS — M1711 Unilateral primary osteoarthritis, right knee: Secondary | ICD-10-CM | POA: Diagnosis not present

## 2019-11-29 ENCOUNTER — Encounter: Payer: Self-pay | Admitting: Family Medicine

## 2019-12-31 DIAGNOSIS — M545 Low back pain: Secondary | ICD-10-CM | POA: Diagnosis not present

## 2019-12-31 DIAGNOSIS — G894 Chronic pain syndrome: Secondary | ICD-10-CM | POA: Diagnosis not present

## 2019-12-31 DIAGNOSIS — F419 Anxiety disorder, unspecified: Secondary | ICD-10-CM | POA: Diagnosis not present

## 2020-01-01 DIAGNOSIS — M25561 Pain in right knee: Secondary | ICD-10-CM | POA: Diagnosis not present

## 2020-01-01 DIAGNOSIS — M1711 Unilateral primary osteoarthritis, right knee: Secondary | ICD-10-CM | POA: Diagnosis not present

## 2020-01-06 ENCOUNTER — Other Ambulatory Visit: Payer: Self-pay | Admitting: Family Medicine

## 2020-01-12 DIAGNOSIS — M25561 Pain in right knee: Secondary | ICD-10-CM | POA: Diagnosis not present

## 2020-01-17 DIAGNOSIS — M1712 Unilateral primary osteoarthritis, left knee: Secondary | ICD-10-CM | POA: Diagnosis not present

## 2020-01-17 DIAGNOSIS — M25561 Pain in right knee: Secondary | ICD-10-CM | POA: Diagnosis not present

## 2020-01-17 DIAGNOSIS — M1711 Unilateral primary osteoarthritis, right knee: Secondary | ICD-10-CM | POA: Diagnosis not present

## 2020-01-17 DIAGNOSIS — M170 Bilateral primary osteoarthritis of knee: Secondary | ICD-10-CM | POA: Diagnosis not present

## 2020-01-24 DIAGNOSIS — M25561 Pain in right knee: Secondary | ICD-10-CM | POA: Diagnosis not present

## 2020-01-24 DIAGNOSIS — M1711 Unilateral primary osteoarthritis, right knee: Secondary | ICD-10-CM | POA: Diagnosis not present

## 2020-02-01 DIAGNOSIS — M1711 Unilateral primary osteoarthritis, right knee: Secondary | ICD-10-CM | POA: Diagnosis not present

## 2020-02-01 DIAGNOSIS — M25561 Pain in right knee: Secondary | ICD-10-CM | POA: Diagnosis not present

## 2020-02-21 DIAGNOSIS — M48062 Spinal stenosis, lumbar region with neurogenic claudication: Secondary | ICD-10-CM | POA: Diagnosis not present

## 2020-02-28 DIAGNOSIS — H2513 Age-related nuclear cataract, bilateral: Secondary | ICD-10-CM | POA: Diagnosis not present

## 2020-02-28 DIAGNOSIS — H532 Diplopia: Secondary | ICD-10-CM | POA: Diagnosis not present

## 2020-03-06 ENCOUNTER — Other Ambulatory Visit: Payer: Self-pay | Admitting: Neurosurgery

## 2020-03-06 DIAGNOSIS — S32009K Unspecified fracture of unspecified lumbar vertebra, subsequent encounter for fracture with nonunion: Secondary | ICD-10-CM

## 2020-03-19 ENCOUNTER — Ambulatory Visit
Admission: RE | Admit: 2020-03-19 | Discharge: 2020-03-19 | Disposition: A | Discharge status: Home / Self Care | Payer: Medicare Other | Source: Ambulatory Visit | Attending: Neurosurgery | Admitting: Neurosurgery

## 2020-03-19 DIAGNOSIS — S32009K Unspecified fracture of unspecified lumbar vertebra, subsequent encounter for fracture with nonunion: Secondary | ICD-10-CM

## 2020-03-19 DIAGNOSIS — M4324 Fusion of spine, thoracic region: Secondary | ICD-10-CM | POA: Diagnosis not present

## 2020-03-19 DIAGNOSIS — M546 Pain in thoracic spine: Secondary | ICD-10-CM | POA: Diagnosis not present

## 2020-03-25 DIAGNOSIS — M48062 Spinal stenosis, lumbar region with neurogenic claudication: Secondary | ICD-10-CM | POA: Diagnosis not present

## 2020-03-27 DIAGNOSIS — M48062 Spinal stenosis, lumbar region with neurogenic claudication: Secondary | ICD-10-CM | POA: Diagnosis not present

## 2020-03-27 DIAGNOSIS — M5137 Other intervertebral disc degeneration, lumbosacral region: Secondary | ICD-10-CM | POA: Diagnosis not present

## 2020-03-31 DIAGNOSIS — G894 Chronic pain syndrome: Secondary | ICD-10-CM | POA: Diagnosis not present

## 2020-03-31 DIAGNOSIS — F419 Anxiety disorder, unspecified: Secondary | ICD-10-CM | POA: Diagnosis not present

## 2020-04-01 DIAGNOSIS — M5137 Other intervertebral disc degeneration, lumbosacral region: Secondary | ICD-10-CM | POA: Diagnosis not present

## 2020-04-01 DIAGNOSIS — M48062 Spinal stenosis, lumbar region with neurogenic claudication: Secondary | ICD-10-CM | POA: Diagnosis not present

## 2020-04-03 DIAGNOSIS — M5137 Other intervertebral disc degeneration, lumbosacral region: Secondary | ICD-10-CM | POA: Diagnosis not present

## 2020-04-03 DIAGNOSIS — M48062 Spinal stenosis, lumbar region with neurogenic claudication: Secondary | ICD-10-CM | POA: Diagnosis not present

## 2020-04-04 DIAGNOSIS — M48062 Spinal stenosis, lumbar region with neurogenic claudication: Secondary | ICD-10-CM | POA: Diagnosis not present

## 2020-04-04 DIAGNOSIS — M5137 Other intervertebral disc degeneration, lumbosacral region: Secondary | ICD-10-CM | POA: Diagnosis not present

## 2020-04-05 ENCOUNTER — Other Ambulatory Visit: Payer: Self-pay | Admitting: Family Medicine

## 2020-04-07 DIAGNOSIS — M5137 Other intervertebral disc degeneration, lumbosacral region: Secondary | ICD-10-CM | POA: Diagnosis not present

## 2020-04-07 DIAGNOSIS — Z125 Encounter for screening for malignant neoplasm of prostate: Secondary | ICD-10-CM | POA: Diagnosis not present

## 2020-04-07 DIAGNOSIS — M48062 Spinal stenosis, lumbar region with neurogenic claudication: Secondary | ICD-10-CM | POA: Diagnosis not present

## 2020-04-07 DIAGNOSIS — E785 Hyperlipidemia, unspecified: Secondary | ICD-10-CM | POA: Diagnosis not present

## 2020-04-07 DIAGNOSIS — I10 Essential (primary) hypertension: Secondary | ICD-10-CM | POA: Diagnosis not present

## 2020-04-07 NOTE — Telephone Encounter (Signed)
Scheduled 6/22

## 2020-04-08 DIAGNOSIS — M48062 Spinal stenosis, lumbar region with neurogenic claudication: Secondary | ICD-10-CM | POA: Diagnosis not present

## 2020-04-08 DIAGNOSIS — M5137 Other intervertebral disc degeneration, lumbosacral region: Secondary | ICD-10-CM | POA: Diagnosis not present

## 2020-04-08 LAB — HEPATIC FUNCTION PANEL
ALT: 19 IU/L (ref 0–44)
AST: 20 IU/L (ref 0–40)
Albumin: 4.4 g/dL (ref 3.7–4.7)
Alkaline Phosphatase: 65 IU/L (ref 48–121)
Bilirubin Total: 0.4 mg/dL (ref 0.0–1.2)
Bilirubin, Direct: 0.11 mg/dL (ref 0.00–0.40)
Total Protein: 6.9 g/dL (ref 6.0–8.5)

## 2020-04-08 LAB — BASIC METABOLIC PANEL
BUN/Creatinine Ratio: 19 (ref 10–24)
BUN: 21 mg/dL (ref 8–27)
CO2: 24 mmol/L (ref 20–29)
Calcium: 9.9 mg/dL (ref 8.6–10.2)
Chloride: 101 mmol/L (ref 96–106)
Creatinine, Ser: 1.11 mg/dL (ref 0.76–1.27)
GFR calc Af Amer: 76 mL/min/{1.73_m2} (ref >59)
GFR calc non Af Amer: 66 mL/min/{1.73_m2} (ref >59)
Glucose: 134 mg/dL — ABNORMAL HIGH (ref 65–99)
Potassium: 4.9 mmol/L (ref 3.5–5.2)
Sodium: 143 mmol/L (ref 134–144)

## 2020-04-08 LAB — LIPID PANEL
Chol/HDL Ratio: 3.8 ratio (ref 0.0–5.0)
Cholesterol, Total: 156 mg/dL (ref 100–199)
HDL: 41 mg/dL (ref >39)
LDL Chol Calc (NIH): 89 mg/dL (ref 0–99)
Triglycerides: 151 mg/dL — ABNORMAL HIGH (ref 0–149)
VLDL Cholesterol Cal: 26 mg/dL (ref 5–40)

## 2020-04-08 LAB — PSA: Prostate Specific Ag, Serum: 3.2 ng/mL (ref 0.0–4.0)

## 2020-04-15 ENCOUNTER — Ambulatory Visit (INDEPENDENT_AMBULATORY_CARE_PROVIDER_SITE_OTHER): Payer: Medicare Other | Admitting: Family Medicine

## 2020-04-15 ENCOUNTER — Other Ambulatory Visit: Payer: Self-pay

## 2020-04-15 ENCOUNTER — Encounter: Payer: Self-pay | Admitting: Family Medicine

## 2020-04-15 VITALS — BP 122/74 | HR 82 | Temp 97.2°F | Wt 311.0 lb

## 2020-04-15 DIAGNOSIS — R7301 Impaired fasting glucose: Secondary | ICD-10-CM | POA: Diagnosis not present

## 2020-04-15 DIAGNOSIS — Z9889 Other specified postprocedural states: Secondary | ICD-10-CM | POA: Insufficient documentation

## 2020-04-15 DIAGNOSIS — E785 Hyperlipidemia, unspecified: Secondary | ICD-10-CM | POA: Diagnosis not present

## 2020-04-15 DIAGNOSIS — G894 Chronic pain syndrome: Secondary | ICD-10-CM | POA: Diagnosis not present

## 2020-04-15 DIAGNOSIS — Z6841 Body Mass Index (BMI) 40.0 and over, adult: Secondary | ICD-10-CM

## 2020-04-15 DIAGNOSIS — E119 Type 2 diabetes mellitus without complications: Secondary | ICD-10-CM | POA: Insufficient documentation

## 2020-04-15 DIAGNOSIS — I10 Essential (primary) hypertension: Secondary | ICD-10-CM

## 2020-04-15 MED ORDER — LISINOPRIL-HYDROCHLOROTHIAZIDE 20-25 MG PO TABS: ORAL_TABLET | ORAL | 1 refills | Status: DC

## 2020-04-15 MED ORDER — SIMVASTATIN 40 MG PO TABS: ORAL_TABLET | ORAL | 1 refills | Status: DC

## 2020-04-15 NOTE — Progress Notes (Signed)
Patient ID: Michael Barr, male    DOB: 06-14-1946, 74 y.o.   MRN: [id number]   Chief Complaint  Patient presents with  . Hypertension  . Hyperlipidemia   Subjective:   Pt seen for f/u on HTN and HLD.  htn- doing well, no SE meds.  No chest pain, sob, headache, dizziness.  Does have some left lower ankle swelling, h/o left knee surgery in past.  hld- doing well on zocor.   Pt hsa h/o multiple back surgeries.  Is seeing orthopedics for his chronic pain medications.  Taking gabpentin, oxycodone, methadone.    For insomnia- taking xanax 0.76m tid per other provider. Also taking trazodone 1060mqhs.  Medical History ClDamareonas a past medical history of Anxiety, Arthritis, Asthma (70's), Cataract, Chronic back pain, Constipation, Coronary artery disease, Depression, Fibromyalgia, GERD (gastroesophageal reflux disease), History of kidney stones, Hyperlipidemia, Hypertension, Hypokalemia, Insomnia, Joint pain, Joint swelling, Nocturia, Pneumonia (70's), and Weakness.   Outpatient Encounter Medications as of 04/15/2020  Medication Sig  . acetaminophen (TYLENOL) 500 MG tablet Take 500 mg by mouth 3 (three) times daily.   . Marland KitchenLPRAZolam (XANAX) 0.25 MG tablet Take 0.25 mg by mouth 3 (three) times daily.  . Marland Kitchenspirin EC 325 MG tablet Take 325 mg by mouth daily.  . diphenhydrAMINE (BENADRYL) 25 mg capsule Take 25 mg by mouth at bedtime as needed for itching or allergies.  . Marland Kitchenabapentin (NEURONTIN) 300 MG capsule Take 300 mg by mouth See admin instructions. Take 1 capsule (300 mg) by mouth up to 3 times daily (typically takes 2 times daily)  . lisinopril-hydrochlorothiazide (ZESTORETIC) 20-25 MG tablet TAKE 1 TABLET BY MOUTH EVERY DAY IN THE MORNING  . methadone (DOLOPHINE) 5 MG tablet Take 5 mg by mouth 3 (three) times daily.  . Multiple Vitamins-Minerals (MULTIVITAMIN WITH MINERALS) tablet Take 1 tablet by mouth daily.  . nitroGLYCERIN (NITROSTAT) 0.4 MG SL tablet Place 1 tablet (0.4 mg  total) under the tongue every 5 (five) minutes as needed for chest pain.  . Oxycodone HCl 10 MG TABS Take 10 mg by mouth 3 (three) times daily.  . Potassium 99 MG TABS Take 99 mg by mouth daily as needed (fatigue).   . Propylhexedrine (BENZEDREX NA) Place 1 Inhaler into the nose as needed (congestion.).  . Marland Kitchenenna (SENOKOT) 8.6 MG TABS tablet Take 1-2 tablets by mouth daily as needed (constipation.).   . Marland Kitchenimvastatin (ZOCOR) 40 MG tablet TAKE 1 TABLET BY MOUTH EVERYDAY AT BEDTIME  . traZODone (DESYREL) 100 MG tablet Take 250 mg by mouth at bedtime.   . [DISCONTINUED] lisinopril-hydrochlorothiazide (ZESTORETIC) 20-25 MG tablet TAKE 1 TABLET BY MOUTH EVERY DAY IN THE MORNING  . [DISCONTINUED] simvastatin (ZOCOR) 40 MG tablet TAKE 1 TABLET BY MOUTH EVERYDAY AT BEDTIME   No facility-administered encounter medications on file as of 04/15/2020.     Review of Systems  Constitutional: Negative for chills and fever.  HENT: Negative for congestion, rhinorrhea and sore throat.   Respiratory: Negative for cough, shortness of breath and wheezing.   Cardiovascular: Negative for chest pain and leg swelling.  Gastrointestinal: Negative for abdominal pain, diarrhea, nausea and vomiting.  Genitourinary: Negative for dysuria and frequency.  Musculoskeletal: Positive for back pain (chronic back pain).  Skin: Negative for rash.  Neurological: Negative for dizziness, weakness and headaches.     Vitals BP 122/74   Pulse 82   Temp (!) 97.2 F (36.2 C)   Wt (!) 311 lb (141.1 kg)   SpO2 96%  BMI 45.93 kg/m   Objective:   Physical Exam Vitals and nursing note reviewed.  Constitutional:      General: He is not in acute distress.    Appearance: Normal appearance. He is obese. He is not ill-appearing.  HENT:     Head: Normocephalic.     Nose: Nose normal. No congestion.     Mouth/Throat:     Mouth: Mucous membranes are moist.     Pharynx: No oropharyngeal exudate.  Eyes:     Extraocular Movements:  Extraocular movements intact.     Conjunctiva/sclera: Conjunctivae normal.     Pupils: Pupils are equal, round, and reactive to light.  Cardiovascular:     Rate and Rhythm: Normal rate and regular rhythm.     Pulses: Normal pulses.     Heart sounds: Normal heart sounds. No murmur heard.   Pulmonary:     Effort: Pulmonary effort is normal.     Breath sounds: Normal breath sounds. No wheezing, rhonchi or rales.  Musculoskeletal:        General: Normal range of motion.     Right lower leg: No edema.     Left lower leg: No edema.     Comments: +walking with a cane.  Skin:    General: Skin is warm and dry.     Findings: No rash.  Neurological:     General: No focal deficit present.     Mental Status: He is alert and oriented to person, place, and time.     Cranial Nerves: No cranial nerve deficit.  Psychiatric:        Mood and Affect: Mood normal.        Behavior: Behavior normal.        Thought Content: Thought content normal.        Judgment: Judgment normal.      Assessment and Plan   1. Essential hypertension - CBC; Future - CMP14+EGFR; Future - Lipid panel; Future - lisinopril-hydrochlorothiazide (ZESTORETIC) 20-25 MG tablet; TAKE 1 TABLET BY MOUTH EVERY DAY IN THE MORNING  Dispense: 90 tablet; Refill: 1  2. Impaired fasting blood sugar - Hemoglobin A1c; Future  3. History of lumbar surgery  4. Hyperlipidemia LDL goal <70 - Lipid panel; Future - simvastatin (ZOCOR) 40 MG tablet; TAKE 1 TABLET BY MOUTH EVERYDAY AT BEDTIME  Dispense: 90 tablet; Refill: 1  5. Chronic pain syndrome  6. Morbid obesity with BMI of 45.0-49.9, adult ([place])   Pt seeing Orthopedics for chronic pain med management.  Dr. Luetta Nutting.  Obesity- Advising to watch carbs in diet and increase exercising as tolerated.  HTN- stable, cont meds.  HLD- stable, cont meds.  Impaired fasting bg- cont to monitor and dec carb intake and inc exercising as tolerated.  F/u [address] prn.

## 2020-04-16 DIAGNOSIS — M5137 Other intervertebral disc degeneration, lumbosacral region: Secondary | ICD-10-CM | POA: Diagnosis not present

## 2020-04-16 DIAGNOSIS — M48062 Spinal stenosis, lumbar region with neurogenic claudication: Secondary | ICD-10-CM | POA: Diagnosis not present

## 2020-04-18 DIAGNOSIS — M5137 Other intervertebral disc degeneration, lumbosacral region: Secondary | ICD-10-CM | POA: Diagnosis not present

## 2020-04-18 DIAGNOSIS — M48062 Spinal stenosis, lumbar region with neurogenic claudication: Secondary | ICD-10-CM | POA: Diagnosis not present

## 2020-04-21 DIAGNOSIS — M48062 Spinal stenosis, lumbar region with neurogenic claudication: Secondary | ICD-10-CM | POA: Diagnosis not present

## 2020-04-21 DIAGNOSIS — M5137 Other intervertebral disc degeneration, lumbosacral region: Secondary | ICD-10-CM | POA: Diagnosis not present

## 2020-04-23 DIAGNOSIS — H5034 Intermittent alternating exotropia: Secondary | ICD-10-CM | POA: Diagnosis not present

## 2020-04-23 DIAGNOSIS — M48062 Spinal stenosis, lumbar region with neurogenic claudication: Secondary | ICD-10-CM | POA: Diagnosis not present

## 2020-04-23 DIAGNOSIS — M5137 Other intervertebral disc degeneration, lumbosacral region: Secondary | ICD-10-CM | POA: Diagnosis not present

## 2020-04-30 DIAGNOSIS — M48062 Spinal stenosis, lumbar region with neurogenic claudication: Secondary | ICD-10-CM | POA: Diagnosis not present

## 2020-04-30 DIAGNOSIS — M5137 Other intervertebral disc degeneration, lumbosacral region: Secondary | ICD-10-CM | POA: Diagnosis not present

## 2020-05-02 DIAGNOSIS — M48062 Spinal stenosis, lumbar region with neurogenic claudication: Secondary | ICD-10-CM | POA: Diagnosis not present

## 2020-05-02 DIAGNOSIS — M5137 Other intervertebral disc degeneration, lumbosacral region: Secondary | ICD-10-CM | POA: Diagnosis not present

## 2020-05-05 DIAGNOSIS — M5137 Other intervertebral disc degeneration, lumbosacral region: Secondary | ICD-10-CM | POA: Diagnosis not present

## 2020-05-05 DIAGNOSIS — M48062 Spinal stenosis, lumbar region with neurogenic claudication: Secondary | ICD-10-CM | POA: Diagnosis not present

## 2020-05-07 DIAGNOSIS — M5137 Other intervertebral disc degeneration, lumbosacral region: Secondary | ICD-10-CM | POA: Diagnosis not present

## 2020-05-07 DIAGNOSIS — M48062 Spinal stenosis, lumbar region with neurogenic claudication: Secondary | ICD-10-CM | POA: Diagnosis not present

## 2020-05-09 DIAGNOSIS — M48062 Spinal stenosis, lumbar region with neurogenic claudication: Secondary | ICD-10-CM | POA: Diagnosis not present

## 2020-05-09 DIAGNOSIS — M5137 Other intervertebral disc degeneration, lumbosacral region: Secondary | ICD-10-CM | POA: Diagnosis not present

## 2020-05-12 DIAGNOSIS — M48062 Spinal stenosis, lumbar region with neurogenic claudication: Secondary | ICD-10-CM | POA: Diagnosis not present

## 2020-05-12 DIAGNOSIS — M5137 Other intervertebral disc degeneration, lumbosacral region: Secondary | ICD-10-CM | POA: Diagnosis not present

## 2020-05-14 DIAGNOSIS — M5137 Other intervertebral disc degeneration, lumbosacral region: Secondary | ICD-10-CM | POA: Diagnosis not present

## 2020-05-14 DIAGNOSIS — M48062 Spinal stenosis, lumbar region with neurogenic claudication: Secondary | ICD-10-CM | POA: Diagnosis not present

## 2020-05-16 DIAGNOSIS — M48062 Spinal stenosis, lumbar region with neurogenic claudication: Secondary | ICD-10-CM | POA: Diagnosis not present

## 2020-05-16 DIAGNOSIS — M5137 Other intervertebral disc degeneration, lumbosacral region: Secondary | ICD-10-CM | POA: Diagnosis not present

## 2020-05-19 DIAGNOSIS — M48062 Spinal stenosis, lumbar region with neurogenic claudication: Secondary | ICD-10-CM | POA: Diagnosis not present

## 2020-05-19 DIAGNOSIS — M5137 Other intervertebral disc degeneration, lumbosacral region: Secondary | ICD-10-CM | POA: Diagnosis not present

## 2020-05-21 DIAGNOSIS — M5137 Other intervertebral disc degeneration, lumbosacral region: Secondary | ICD-10-CM | POA: Diagnosis not present

## 2020-05-21 DIAGNOSIS — M48062 Spinal stenosis, lumbar region with neurogenic claudication: Secondary | ICD-10-CM | POA: Diagnosis not present

## 2020-05-23 DIAGNOSIS — M48062 Spinal stenosis, lumbar region with neurogenic claudication: Secondary | ICD-10-CM | POA: Diagnosis not present

## 2020-05-23 DIAGNOSIS — M5137 Other intervertebral disc degeneration, lumbosacral region: Secondary | ICD-10-CM | POA: Diagnosis not present

## 2020-05-26 DIAGNOSIS — M5137 Other intervertebral disc degeneration, lumbosacral region: Secondary | ICD-10-CM | POA: Diagnosis not present

## 2020-05-26 DIAGNOSIS — M48062 Spinal stenosis, lumbar region with neurogenic claudication: Secondary | ICD-10-CM | POA: Diagnosis not present

## 2020-05-28 DIAGNOSIS — M48062 Spinal stenosis, lumbar region with neurogenic claudication: Secondary | ICD-10-CM | POA: Diagnosis not present

## 2020-05-28 DIAGNOSIS — M5137 Other intervertebral disc degeneration, lumbosacral region: Secondary | ICD-10-CM | POA: Diagnosis not present

## 2020-05-30 DIAGNOSIS — M48062 Spinal stenosis, lumbar region with neurogenic claudication: Secondary | ICD-10-CM | POA: Diagnosis not present

## 2020-05-30 DIAGNOSIS — M5137 Other intervertebral disc degeneration, lumbosacral region: Secondary | ICD-10-CM | POA: Diagnosis not present

## 2020-06-02 DIAGNOSIS — M48062 Spinal stenosis, lumbar region with neurogenic claudication: Secondary | ICD-10-CM | POA: Diagnosis not present

## 2020-06-02 DIAGNOSIS — M5137 Other intervertebral disc degeneration, lumbosacral region: Secondary | ICD-10-CM | POA: Diagnosis not present

## 2020-06-04 DIAGNOSIS — M48062 Spinal stenosis, lumbar region with neurogenic claudication: Secondary | ICD-10-CM | POA: Diagnosis not present

## 2020-06-04 DIAGNOSIS — M5137 Other intervertebral disc degeneration, lumbosacral region: Secondary | ICD-10-CM | POA: Diagnosis not present

## 2020-06-06 DIAGNOSIS — M5137 Other intervertebral disc degeneration, lumbosacral region: Secondary | ICD-10-CM | POA: Diagnosis not present

## 2020-06-06 DIAGNOSIS — M48062 Spinal stenosis, lumbar region with neurogenic claudication: Secondary | ICD-10-CM | POA: Diagnosis not present

## 2020-06-09 DIAGNOSIS — M48062 Spinal stenosis, lumbar region with neurogenic claudication: Secondary | ICD-10-CM | POA: Diagnosis not present

## 2020-06-09 DIAGNOSIS — M5137 Other intervertebral disc degeneration, lumbosacral region: Secondary | ICD-10-CM | POA: Diagnosis not present

## 2020-06-11 DIAGNOSIS — M5137 Other intervertebral disc degeneration, lumbosacral region: Secondary | ICD-10-CM | POA: Diagnosis not present

## 2020-06-11 DIAGNOSIS — M48062 Spinal stenosis, lumbar region with neurogenic claudication: Secondary | ICD-10-CM | POA: Diagnosis not present

## 2020-06-13 DIAGNOSIS — M5137 Other intervertebral disc degeneration, lumbosacral region: Secondary | ICD-10-CM | POA: Diagnosis not present

## 2020-06-13 DIAGNOSIS — M48062 Spinal stenosis, lumbar region with neurogenic claudication: Secondary | ICD-10-CM | POA: Diagnosis not present

## 2020-07-02 DIAGNOSIS — F419 Anxiety disorder, unspecified: Secondary | ICD-10-CM | POA: Diagnosis not present

## 2020-07-02 DIAGNOSIS — G894 Chronic pain syndrome: Secondary | ICD-10-CM | POA: Diagnosis not present

## 2020-07-02 DIAGNOSIS — M545 Low back pain: Secondary | ICD-10-CM | POA: Diagnosis not present

## 2020-08-13 DIAGNOSIS — H524 Presbyopia: Secondary | ICD-10-CM | POA: Diagnosis not present

## 2020-08-13 DIAGNOSIS — H2513 Age-related nuclear cataract, bilateral: Secondary | ICD-10-CM | POA: Diagnosis not present

## 2020-08-13 DIAGNOSIS — Z23 Encounter for immunization: Secondary | ICD-10-CM | POA: Diagnosis not present

## 2020-08-13 DIAGNOSIS — H5212 Myopia, left eye: Secondary | ICD-10-CM | POA: Diagnosis not present

## 2020-08-13 DIAGNOSIS — H52223 Regular astigmatism, bilateral: Secondary | ICD-10-CM | POA: Diagnosis not present

## 2020-10-06 DIAGNOSIS — Z79899 Other long term (current) drug therapy: Secondary | ICD-10-CM | POA: Diagnosis not present

## 2020-10-06 DIAGNOSIS — Z79891 Long term (current) use of opiate analgesic: Secondary | ICD-10-CM | POA: Diagnosis not present

## 2020-10-06 DIAGNOSIS — F419 Anxiety disorder, unspecified: Secondary | ICD-10-CM | POA: Diagnosis not present

## 2020-10-06 DIAGNOSIS — M19011 Primary osteoarthritis, right shoulder: Secondary | ICD-10-CM | POA: Diagnosis not present

## 2020-10-06 DIAGNOSIS — G894 Chronic pain syndrome: Secondary | ICD-10-CM | POA: Diagnosis not present

## 2020-10-06 DIAGNOSIS — M25511 Pain in right shoulder: Secondary | ICD-10-CM | POA: Diagnosis not present

## 2020-10-06 DIAGNOSIS — Z5181 Encounter for therapeutic drug level monitoring: Secondary | ICD-10-CM | POA: Diagnosis not present

## 2020-10-14 DIAGNOSIS — M67813 Other specified disorders of tendon, right shoulder: Secondary | ICD-10-CM | POA: Diagnosis not present

## 2020-10-14 DIAGNOSIS — M7551 Bursitis of right shoulder: Secondary | ICD-10-CM | POA: Diagnosis not present

## 2020-10-14 DIAGNOSIS — M25511 Pain in right shoulder: Secondary | ICD-10-CM | POA: Diagnosis not present

## 2020-10-15 ENCOUNTER — Encounter: Payer: Self-pay | Admitting: Family Medicine

## 2020-10-15 ENCOUNTER — Ambulatory Visit (INDEPENDENT_AMBULATORY_CARE_PROVIDER_SITE_OTHER): Payer: Medicare Other | Admitting: Family Medicine

## 2020-10-15 ENCOUNTER — Other Ambulatory Visit: Payer: Self-pay

## 2020-10-15 VITALS — BP 120/76 | HR 96 | Temp 97.5°F | Ht 69.0 in | Wt 316.2 lb

## 2020-10-15 DIAGNOSIS — M797 Fibromyalgia: Secondary | ICD-10-CM | POA: Diagnosis not present

## 2020-10-15 DIAGNOSIS — G894 Chronic pain syndrome: Secondary | ICD-10-CM

## 2020-10-15 DIAGNOSIS — R7301 Impaired fasting glucose: Secondary | ICD-10-CM | POA: Diagnosis not present

## 2020-10-15 DIAGNOSIS — Z9889 Other specified postprocedural states: Secondary | ICD-10-CM

## 2020-10-15 DIAGNOSIS — E785 Hyperlipidemia, unspecified: Secondary | ICD-10-CM | POA: Diagnosis not present

## 2020-10-15 NOTE — Progress Notes (Signed)
Patient ID: Michael Barr, male    DOB: 1946-02-24, 74 y.o.   MRN: [id number]   Chief Complaint  Patient presents with  . Hypertension   Subjective:    HPI Pt here for blood pressure follow up. Pt goes to [facility] for Chronic pain clinic-had MRI on shoulder. Seeing his orthopedic doctor for management of chronic pain. Pt reports no issues with blood pressure.   Pt had mri on right shoulder from [facility].  Going to see them in [place].  Seeing an ortho/ pain clinic in [place].  Pt had 7 back surgeries over the last 7 yrs. Pt used to see [facility] and then his doctor went to [facility] at [employer].  Started with pain in body in his 53s.  Pt taking methadone and oxycodone.  Used to get injections in shoulder, then stopped working. Shoulder surgeries on both sides in past. H/o fibromyalgia. Pt receiving injections in rt knee and helping.   Was doing rt knee therapy, was going 3x per week, then pt stopped it. At times felt more pain after therapy.  Getting up a lot at night can be 3-4, for urination. Drinking a lot of fluids.  Checking bg at home.  Seeing around 98-110. Drinking diet drinks more. Working on his diet.   HLD- doing well no new concerns.  Compliant with meds. No chest pain, palpitations, myalgias or joint pains.   Medical History Jamin has a past medical history of Anxiety, Arthritis, Asthma (70's), Cataract, Chronic back pain, Constipation, Coronary artery disease, Depression, Fibromyalgia, GERD (gastroesophageal reflux disease), History of kidney stones, Hyperlipidemia, Hypertension, Hypokalemia, Insomnia, Joint pain, Joint swelling, Nocturia, Pneumonia (70's), and Weakness.   Outpatient Encounter Medications as of 10/15/2020  Medication Sig  . acetaminophen (TYLENOL) 500 MG tablet Take 500 mg by mouth 3 (three) times daily.  Marland Kitchen ALPRAZolam (XANAX) 0.25 MG tablet Take 0.25 mg by mouth 3 (three) times daily.  Marland Kitchen aspirin EC 325 MG tablet Take 325 mg  by mouth daily.  . diphenhydrAMINE (BENADRYL) 25 mg capsule Take 25 mg by mouth at bedtime as needed for itching or allergies.  Marland Kitchen gabapentin (NEURONTIN) 300 MG capsule Take 300 mg by mouth See admin instructions. Take 1 capsule (300 mg) by mouth up to 3 times daily (typically takes 2 times daily)  . lisinopril-hydrochlorothiazide (ZESTORETIC) 20-25 MG tablet TAKE 1 TABLET BY MOUTH EVERY DAY IN THE MORNING  . methadone (DOLOPHINE) 5 MG tablet Take 5 mg by mouth 3 (three) times daily.  . Multiple Vitamins-Minerals (MULTIVITAMIN WITH MINERALS) tablet Take 1 tablet by mouth daily.  . nitroGLYCERIN (NITROSTAT) 0.4 MG SL tablet Place 1 tablet (0.4 mg total) under the tongue every 5 (five) minutes as needed for chest pain.  . Oxycodone HCl 10 MG TABS Take 10 mg by mouth 3 (three) times daily.  . Potassium 99 MG TABS Take 99 mg by mouth daily as needed (fatigue).   . Propylhexedrine (BENZEDREX NA) Place 1 Inhaler into the nose as needed (congestion.).  Marland Kitchen senna (SENOKOT) 8.6 MG TABS tablet Take 1-2 tablets by mouth daily as needed (constipation.).   Marland Kitchen simvastatin (ZOCOR) 40 MG tablet TAKE 1 TABLET BY MOUTH EVERYDAY AT BEDTIME  . traZODone (DESYREL) 100 MG tablet Take 250 mg by mouth at bedtime.    No facility-administered encounter medications on file as of 10/15/2020.     Review of Systems  Constitutional: Negative for chills and fever.  HENT: Negative for congestion, rhinorrhea and sore throat.  Respiratory: Negative for cough, shortness of breath and wheezing.   Cardiovascular: Negative for chest pain and leg swelling.  Gastrointestinal: Negative for abdominal pain, diarrhea, nausea and vomiting.  Genitourinary: Negative for dysuria and frequency.  Musculoskeletal: Positive for arthralgias (shoulder pain, knee pain, h/o chronic lumbar pain) and back pain (chronic).  Skin: Negative for rash.  Neurological: Negative for dizziness, weakness and headaches.     Vitals BP 120/76   Pulse 96    Temp (!) 97.5 F (36.4 C)   Ht '5\' 9"'  (1.753 m)   Wt (!) 316 lb 3.2 oz (143.4 kg)   SpO2 96%   BMI 46.69 kg/m   Objective:   Physical Exam Vitals and nursing note reviewed.  Constitutional:      General: He is not in acute distress.    Appearance: Normal appearance. He is not ill-appearing.  HENT:     Head: Normocephalic.     Nose: Nose normal. No congestion.     Mouth/Throat:     Mouth: Mucous membranes are moist.     Pharynx: No oropharyngeal exudate.  Eyes:     Extraocular Movements: Extraocular movements intact.     Conjunctiva/sclera: Conjunctivae normal.     Pupils: Pupils are equal, round, and reactive to light.  Cardiovascular:     Rate and Rhythm: Normal rate and regular rhythm.     Pulses: Normal pulses.     Heart sounds: Normal heart sounds. No murmur heard.   Pulmonary:     Effort: Pulmonary effort is normal.     Breath sounds: Normal breath sounds. No wheezing, rhonchi or rales.  Musculoskeletal:        General: Tenderness (shoulder pain, rt knee pain) present. Normal range of motion.     Right lower leg: No edema.     Left lower leg: No edema.     Comments: +walking with a cane.  Skin:    General: Skin is warm and dry.     Findings: No rash.  Neurological:     General: No focal deficit present.     Mental Status: He is alert and oriented to person, place, and time.     Cranial Nerves: No cranial nerve deficit.  Psychiatric:        Mood and Affect: Mood normal.        Behavior: Behavior normal.        Thought Content: Thought content normal.        Judgment: Judgment normal.      Assessment and Plan   1. Impaired fasting blood sugar - CMP14+EGFR - Hemoglobin A1c  2. Hyperlipidemia LDL goal <70 - Lipid panel  3. Fibromyalgia  4. Chronic pain syndrome  5. History of lumbar surgery    Chronic pain/fibromyalgia- pain in shoulders, lumbar, and knee-chronic.  Cont meds.  f/u with pain clinic.  hld-stable, controlled.  elevated 1 point  over on the TGs.  Likely related to elevated glucose.  Watch carbs in diet.  Cont zocor.   Impaired fasting sugar- 134, pt to work on [employer] and increase in exercising as tolerated.   F/u [address] prn.

## 2020-11-08 IMAGING — CT CT T SPINE W/O CM
3 of 4 series · 9 of 33 positions shown, 11 images · non-contrast
Comparison: 06/23/2018

CLINICAL DATA: Midthoracic pain.

EXAM:
CT THORACIC SPINE WITHOUT CONTRAST
TECHNIQUE: Multidetector CT images of the thoracic were obtained using the
standard protocol without intravenous contrast.

[Series 4: t-spine 2.00 br40 s3 (person_name) · axial · 0.33mm/px · z∈[-1275,-1275]mm · 1 of 191 slices shown, 2 images]
[im 96/191  soft-tissue]
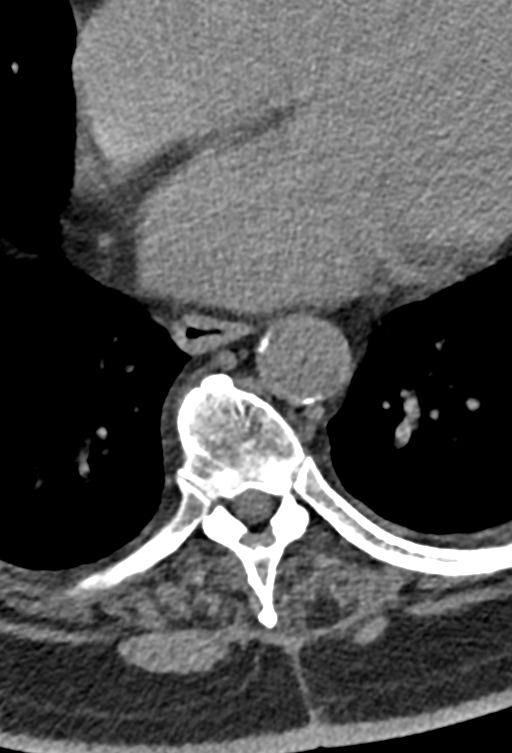
[im 96/191  bone]
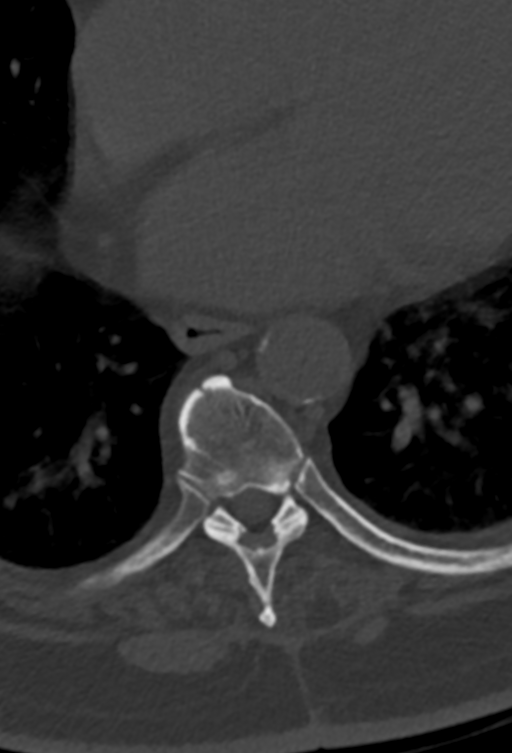

[Series 8: t-spine 2.00 br60 s3 · sagittal · 0.46mm/px · 5 of 84 slices shown, 6 images (1 of 2)]
[im 28/84  bone]
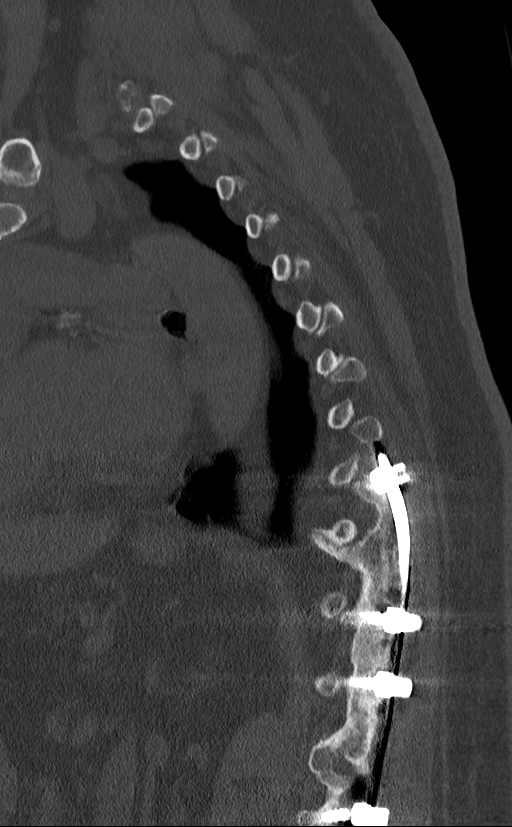
[im 35/84  bone]
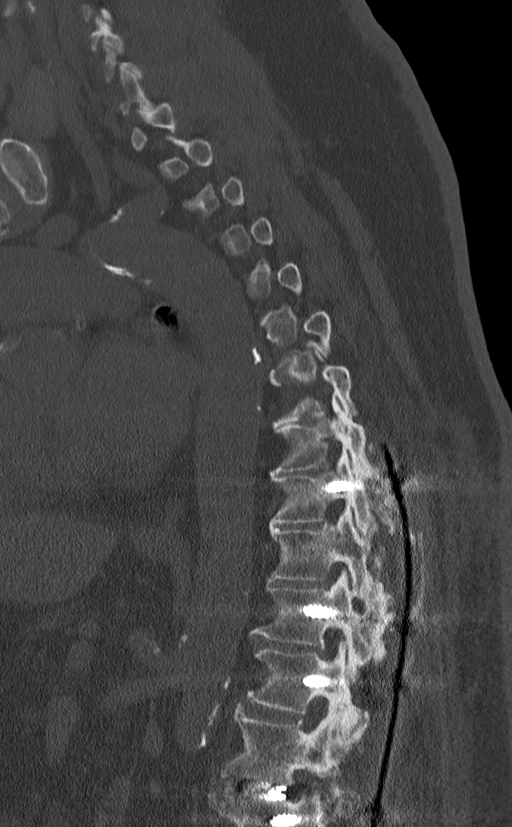
[im 42/84  soft-tissue]
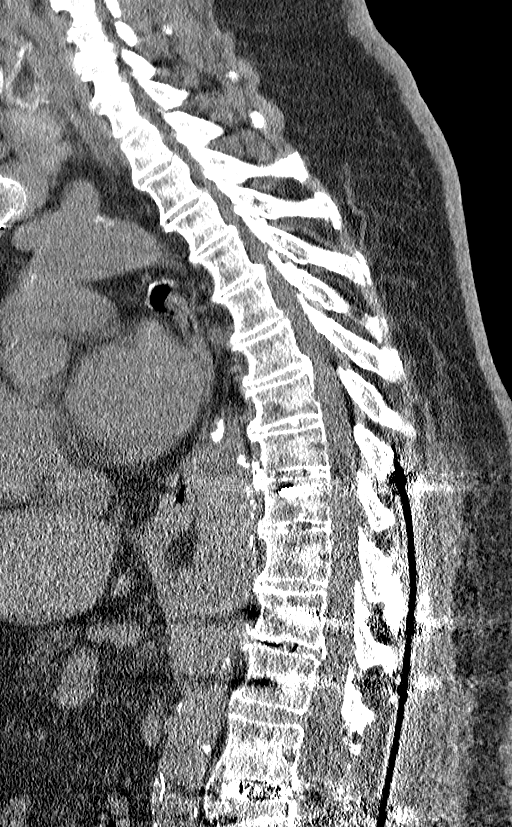
[im 42/84  bone]
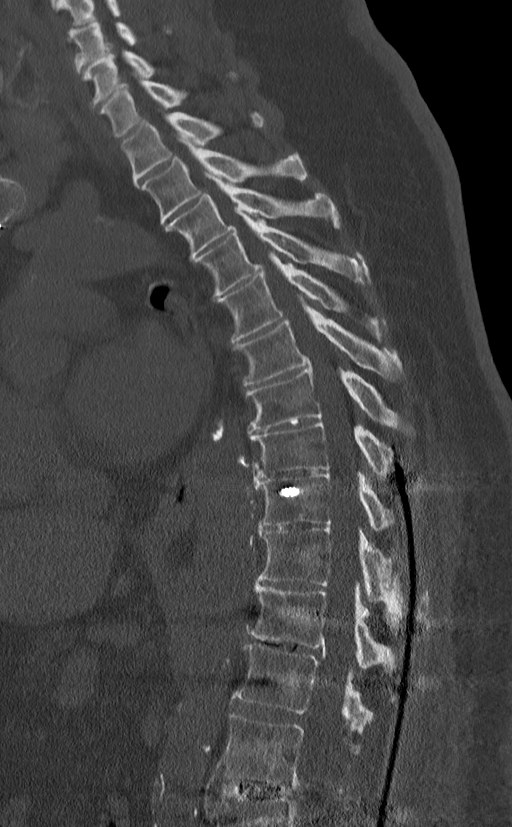
[im 49/84  bone]
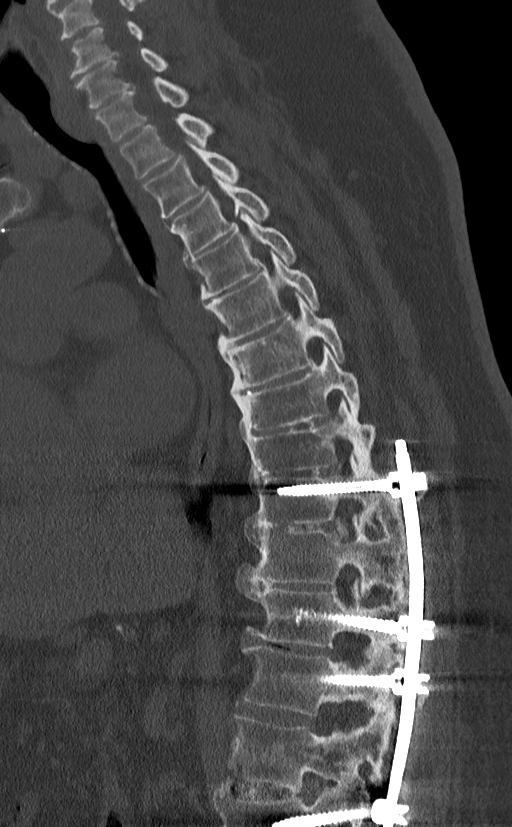
[im 56/84  bone]
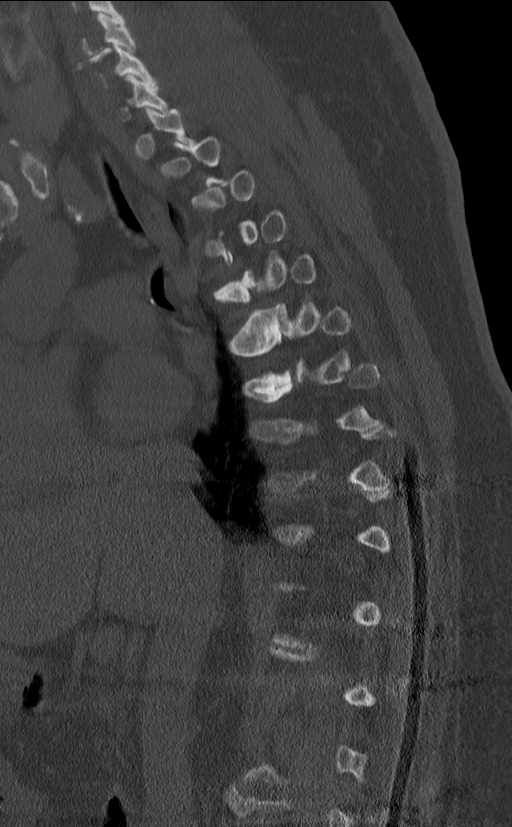

[Series 10: t-spine 2.00 br60 s3 · coronal · 0.33mm/px · 3 of 118 slices shown (2 of 2)]
[im 24/118  bone]
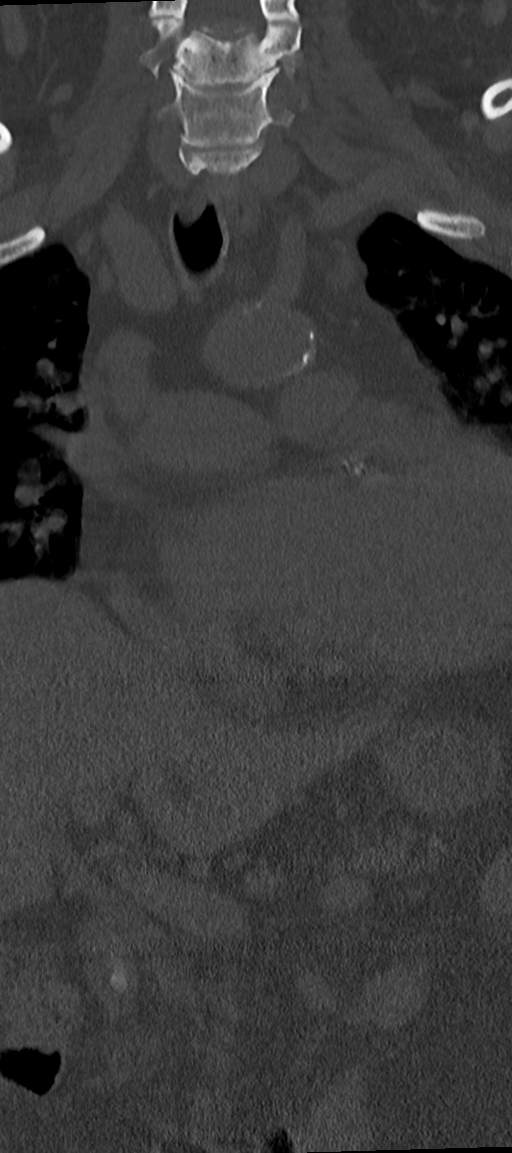
[im 47/118  bone]
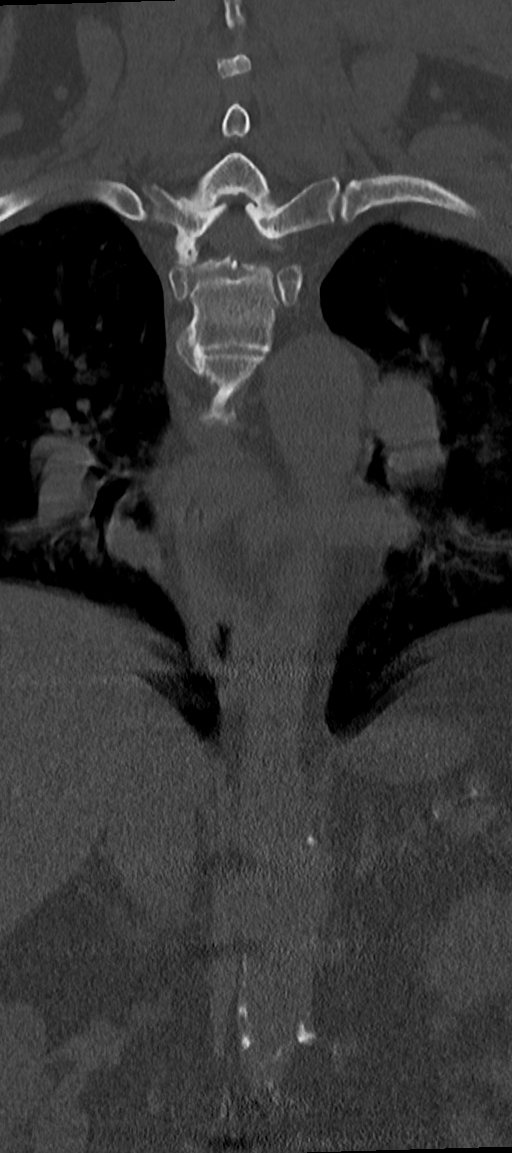
[im 71/118  bone]
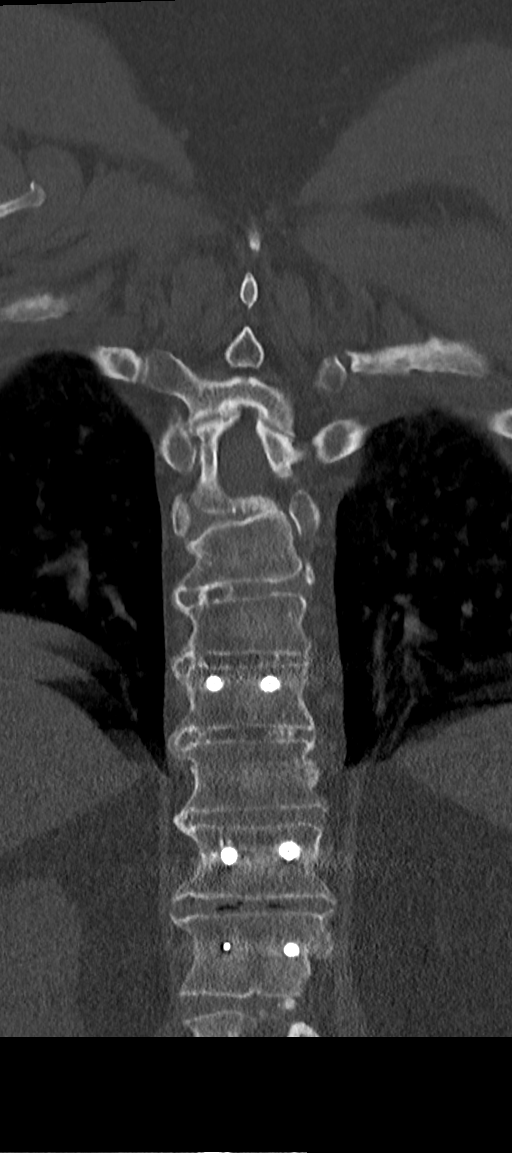

[9 of 33 positions shown; findings below may reference images not displayed]

FINDINGS: Alignment: Exaggerated kyphosis.  No listhesis.

Vertebrae: Since prior there has been extension of posterolateral
fusion to include T9. The T10 pedicle screws (which were loose on
prior) have been removed. Vacuum phenomenon is still seen at the
T11-12 disc space, but there is solid posterior arthrodesis at this
level. At the intervally fused T9-10 level there is also solid
arthrodesis. Hardware is well seated. There are bridging osteophytes
which causes ankylosis from T3-T9.

Paraspinal and other soft tissues: Cardiomegaly. No evidence of mass
or inflammation.

Disc levels: Posterior osteophytes at T2-3 to T5-6 likely contact
the ventral cord at each level, greatest at T2-3 where there may be
cord deformity. Mild generalized facet spurring without high-grade
bony foraminal impingement
IMPRESSION: 1. Interval extension of fusion to include T9. Solid arthrodesis is
seen from T9 into the lumbar spine.
2. Superiorly there is also ankylosis from T3-T9.
3. The T2-3 spine interspace is open and there is prominent
posterior osteophyte at this level which likely impinges on the
cord.

## 2020-12-09 DIAGNOSIS — R7301 Impaired fasting glucose: Secondary | ICD-10-CM | POA: Diagnosis not present

## 2020-12-09 DIAGNOSIS — E785 Hyperlipidemia, unspecified: Secondary | ICD-10-CM | POA: Diagnosis not present

## 2020-12-10 ENCOUNTER — Other Ambulatory Visit: Payer: Self-pay | Admitting: Family Medicine

## 2020-12-10 ENCOUNTER — Encounter: Payer: Self-pay | Admitting: Family Medicine

## 2020-12-10 DIAGNOSIS — F419 Anxiety disorder, unspecified: Secondary | ICD-10-CM | POA: Diagnosis not present

## 2020-12-10 DIAGNOSIS — M25511 Pain in right shoulder: Secondary | ICD-10-CM | POA: Diagnosis not present

## 2020-12-10 DIAGNOSIS — E119 Type 2 diabetes mellitus without complications: Secondary | ICD-10-CM

## 2020-12-10 DIAGNOSIS — G894 Chronic pain syndrome: Secondary | ICD-10-CM | POA: Diagnosis not present

## 2020-12-10 DIAGNOSIS — Z79899 Other long term (current) drug therapy: Secondary | ICD-10-CM | POA: Diagnosis not present

## 2020-12-10 DIAGNOSIS — I10 Essential (primary) hypertension: Secondary | ICD-10-CM

## 2020-12-10 LAB — LIPID PANEL
Chol/HDL Ratio: 3.9 ratio (ref 0.0–5.0)
Cholesterol, Total: 166 mg/dL (ref 100–199)
HDL: 43 mg/dL (ref >39)
LDL Chol Calc (NIH): 95 mg/dL (ref 0–99)
Triglycerides: 160 mg/dL — ABNORMAL HIGH (ref 0–149)
VLDL Cholesterol Cal: 28 mg/dL (ref 5–40)

## 2020-12-10 LAB — CMP14+EGFR
ALT: 27 IU/L (ref 0–44)
AST: 31 IU/L (ref 0–40)
Albumin/Globulin Ratio: 1.7 (ref 1.2–2.2)
Albumin: 4.3 g/dL (ref 3.7–4.7)
Alkaline Phosphatase: 53 IU/L (ref 44–121)
BUN/Creatinine Ratio: 17 (ref 10–24)
BUN: 20 mg/dL (ref 8–27)
Bilirubin Total: 0.4 mg/dL (ref 0.0–1.2)
CO2: 22 mmol/L (ref 20–29)
Calcium: 9.4 mg/dL (ref 8.6–10.2)
Chloride: 101 mmol/L (ref 96–106)
Creatinine, Ser: 1.18 mg/dL (ref 0.76–1.27)
GFR calc Af Amer: 71 mL/min/{1.73_m2} (ref >59)
GFR calc non Af Amer: 61 mL/min/{1.73_m2} (ref >59)
Globulin, Total: 2.5 g/dL (ref 1.5–4.5)
Glucose: 159 mg/dL — ABNORMAL HIGH (ref 65–99)
Potassium: 5.2 mmol/L (ref 3.5–5.2)
Sodium: 141 mmol/L (ref 134–144)
Total Protein: 6.8 g/dL (ref 6.0–8.5)

## 2020-12-10 LAB — HEMOGLOBIN A1C
Est. average glucose Bld gHb Est-mCnc: 186 mg/dL
Hgb A1c MFr Bld: 8.1 % — ABNORMAL HIGH (ref 4.8–5.6)

## 2020-12-10 MED ORDER — METFORMIN HCL 500 MG PO TABS: ORAL_TABLET | ORAL | 0 refills | Status: DC

## 2021-01-02 ENCOUNTER — Other Ambulatory Visit: Payer: Self-pay | Admitting: Family Medicine

## 2021-01-16 ENCOUNTER — Other Ambulatory Visit: Payer: Self-pay | Admitting: Family Medicine

## 2021-01-16 DIAGNOSIS — I10 Essential (primary) hypertension: Secondary | ICD-10-CM

## 2021-01-16 DIAGNOSIS — E785 Hyperlipidemia, unspecified: Secondary | ICD-10-CM

## 2021-02-04 DIAGNOSIS — R310 Gross hematuria: Secondary | ICD-10-CM | POA: Diagnosis not present

## 2021-02-06 ENCOUNTER — Other Ambulatory Visit: Payer: Self-pay | Admitting: Family Medicine

## 2021-03-02 DIAGNOSIS — G894 Chronic pain syndrome: Secondary | ICD-10-CM | POA: Diagnosis not present

## 2021-03-02 DIAGNOSIS — Z79899 Other long term (current) drug therapy: Secondary | ICD-10-CM | POA: Diagnosis not present

## 2021-03-02 DIAGNOSIS — M25511 Pain in right shoulder: Secondary | ICD-10-CM | POA: Diagnosis not present

## 2021-03-02 DIAGNOSIS — F419 Anxiety disorder, unspecified: Secondary | ICD-10-CM | POA: Diagnosis not present

## 2021-03-04 ENCOUNTER — Other Ambulatory Visit: Payer: Self-pay | Admitting: Family Medicine

## 2021-03-24 ENCOUNTER — Encounter: Payer: Self-pay | Admitting: Emergency Medicine

## 2021-03-24 ENCOUNTER — Ambulatory Visit (INDEPENDENT_AMBULATORY_CARE_PROVIDER_SITE_OTHER): Payer: Medicare Other

## 2021-03-24 ENCOUNTER — Ambulatory Visit
Admission: EM | Admit: 2021-03-24 | Discharge: 2021-03-24 | Disposition: A | Discharge status: Home / Self Care | Payer: Medicare Other | Attending: Family Medicine | Admitting: Family Medicine

## 2021-03-24 DIAGNOSIS — R059 Cough, unspecified: Secondary | ICD-10-CM

## 2021-03-24 DIAGNOSIS — R062 Wheezing: Secondary | ICD-10-CM

## 2021-03-24 DIAGNOSIS — J984 Other disorders of lung: Secondary | ICD-10-CM

## 2021-03-24 DIAGNOSIS — R5383 Other fatigue: Secondary | ICD-10-CM

## 2021-03-24 DIAGNOSIS — R0602 Shortness of breath: Secondary | ICD-10-CM

## 2021-03-24 DIAGNOSIS — J189 Pneumonia, unspecified organism: Secondary | ICD-10-CM | POA: Diagnosis not present

## 2021-03-24 DIAGNOSIS — I517 Cardiomegaly: Secondary | ICD-10-CM | POA: Diagnosis not present

## 2021-03-24 HISTORY — DX: Type 2 diabetes mellitus without complications: E11.9

## 2021-03-24 MED ORDER — AEROCHAMBER PLUS FLO-VU MEDIUM MISC
1.0000 | Freq: Once | Status: AC
  Administered 2021-03-24: 1

## 2021-03-24 MED ORDER — BENZONATATE 100 MG PO CAPS: 100.0000 mg | ORAL_CAPSULE | Freq: Three times a day (TID) | ORAL | 0 refills | Status: DC

## 2021-03-24 MED ORDER — DOXYCYCLINE HYCLATE 100 MG PO CAPS: 100.0000 mg | ORAL_CAPSULE | Freq: Two times a day (BID) | ORAL | 0 refills | Status: DC

## 2021-03-24 MED ORDER — ALBUTEROL SULFATE HFA 108 (90 BASE) MCG/ACT IN AERS
2.0000 | INHALATION_SPRAY | Freq: Once | RESPIRATORY_TRACT | Status: AC
  Administered 2021-03-24: 2 via RESPIRATORY_TRACT

## 2021-03-24 MED ORDER — CEFPODOXIME PROXETIL 200 MG PO TABS: 200.0000 mg | ORAL_TABLET | Freq: Two times a day (BID) | ORAL | 0 refills | Status: AC

## 2021-03-24 MED ORDER — METHYLPREDNISOLONE SODIUM SUCC 125 MG IJ SOLR
125.0000 mg | Freq: Once | INTRAMUSCULAR | Status: AC
  Administered 2021-03-24: 125 mg via INTRAMUSCULAR

## 2021-03-24 NOTE — ED Provider Notes (Signed)
[place]   [id number] 03/24/21 Arrival Time: 17   CC: COVID symptoms  SUBJECTIVE: History from: patient.  Michael Barr is a 75 y.o. male who presents with cough, congestion for the last week.  Reports that he is coughing up mucus.  Does report that they have recently gone on vacation and come home. Denies sick exposure to COVID, flu or strep. Denies recent travel. Has negative history of Covid. Has not completed Covid vaccines.  Has completed flu vaccine.  Has not taken OTC medications for this.  Symptoms are worsened with activity. Denies previous symptoms in the past. Denies fever, sinus pain, rhinorrhea, sore throat, nausea, changes in bowel or bladder habits.    ROS: As per HPI.  All other pertinent ROS negative.     Past Medical History:  Diagnosis Date  . Anxiety    takes Xanax and Methadone daily  . Arthritis    low back- DDD  . Asthma 70's  . Cataract    immature and to the left eye  . Chronic back pain    HNP  . Constipation    takes Sennokot nightly  . Coronary artery disease   . Depression    attacks  . Fibromyalgia   . GERD (gastroesophageal reflux disease)    20 YRS AGO, AND DIET CONTROL  . History of kidney stones   . Hyperlipidemia    takes Zocor daily  . Hypertension    takes Zestoretic daily  . Hypokalemia    takes Potassium daily  . Insomnia    takes trazodone nightly  . Joint pain   . Joint swelling   . Nocturia   . Pneumonia 70's  . Type 2 diabetes mellitus without complications ([place])   . Weakness    right leg   Past Surgical History:  Procedure Laterality Date  . APPLICATION OF INTRAOPERATIVE CT SCAN N/A 08/21/2018   Procedure: APPLICATION OF INTRAOPERATIVE CT SCAN;  Surgeon: Kary Kos, MD;  Location: [facility];  Service: Neurosurgery;  Laterality: N/A;  . BACK SURGERY     multiple back surgeries  . CARDIAC CATHETERIZATION     unsure of how long ago  . CARPAL TUNNEL RELEASE Bilateral 70's  . CHOLECYSTECTOMY  1985  .  COLONOSCOPY    . COLONOSCOPY WITH PROPOFOL N/A 07/09/2019   Procedure: COLONOSCOPY WITH PROPOFOL;  Surgeon: Daneil Dolin, MD;  [facility];  Service: Endoscopy;  Laterality: N/A;  12:15pm  . CORONARY ARTERY BYPASS GRAFT  1996   1 vessel  . HARDWARE REMOVAL N/A 03/26/2016   Procedure: HARDWARE REMOVAL Lumbar one-two ;  Surgeon: Kary Kos, MD;  Location: [facility];  Service: Neurosurgery;  Laterality: N/A;  . JOINT REPLACEMENT  1999   lt knee  . KNEE ARTHROSCOPY Right   . LUMBAR LAMINECTOMY/DECOMPRESSION MICRODISCECTOMY Right 09/26/2013   Procedure: LUMBAR LAMINECTOMY/DECOMPRESSION MICRODISCECTOMY RIGHT THREE-FOUR;  Surgeon: Elaina Hoops, MD;  Location: [facility];  Service: Neurosurgery;  Laterality: Right;  . pilondial cyst     x 2   . POLYPECTOMY  07/09/2019   Procedure: POLYPECTOMY;  Surgeon: Daneil Dolin, MD;  [facility];  Service: Endoscopy;;  transverse colon polyps x 2  . POSTERIOR LUMBAR FUSION 4 LEVEL N/A 08/21/2018   Procedure: Posterior Lateral Fusion Thoracic Eight to Pelvis with Iliac screws and Fenestrated screws with kyphon and AIRO;  Surgeon: Kary Kos, MD;  Location: [facility];  Service: Neurosurgery;  Laterality: N/A;  . SHOULDER ARTHROSCOPY Bilateral  left x 2 and right x 1  . TONSILLECTOMY    . tumor removed from right ankle   70's   Allergies  Allergen Reactions  . Penicillins Rash and Other (See Comments)    Has patient had a PCN reaction causing immediate rash, facial/tongue/throat swelling, SOB or lightheadedness with hypotension: UNSPECIFIED  Has patient had a PCN reaction causing severe rash involving mucus membranes or skin necrosis: UNSPECIFIED  Has patient had a PCN reaction that required hospitalization UNSPECIFIED  Has patient had a PCN reaction occurring within the last 10 years: UNSPECIFIED  If all of the above answers are "NO", then may proceed with Cephalosporin use.   No current facility-administered medications on file  prior to encounter.   Current Outpatient Medications on File Prior to Encounter  Medication Sig Dispense Refill  . acetaminophen (TYLENOL) 500 MG tablet Take 500 mg by mouth 3 (three) times daily.    Marland Kitchen ALPRAZolam (XANAX) 0.25 MG tablet Take 0.25 mg by mouth 3 (three) times daily.    Marland Kitchen aspirin EC 325 MG tablet Take 325 mg by mouth daily.    . diphenhydrAMINE (BENADRYL) 25 mg capsule Take 25 mg by mouth at bedtime as needed for itching or allergies.    Marland Kitchen gabapentin (NEURONTIN) 300 MG capsule Take 300 mg by mouth See admin instructions. Take 1 capsule (300 mg) by mouth up to 3 times daily (typically takes 2 times daily)    . lisinopril-hydrochlorothiazide (ZESTORETIC) 20-25 MG tablet TAKE 1 TABLET BY MOUTH EVERY DAY IN THE MORNING 90 tablet 1  . metFORMIN (GLUCOPHAGE) 500 MG tablet TAKE 1 TABLET BY MOUTH TWICE A DAY WITH MEALS 60 tablet 1  . methadone (DOLOPHINE) 5 MG tablet Take 5 mg by mouth 3 (three) times daily.    . Multiple Vitamins-Minerals (MULTIVITAMIN WITH MINERALS) tablet Take 1 tablet by mouth daily.    . nitroGLYCERIN (NITROSTAT) 0.4 MG SL tablet Place 1 tablet (0.4 mg total) under the tongue every 5 (five) minutes as needed for chest pain. 30 tablet 0  . Oxycodone HCl 10 MG TABS Take 10 mg by mouth 3 (three) times daily.    . Potassium 99 MG TABS Take 99 mg by mouth daily as needed (fatigue).     . Propylhexedrine (BENZEDREX NA) Place 1 Inhaler into the nose as needed (congestion.).    Marland Kitchen senna (SENOKOT) 8.6 MG TABS tablet Take 1-2 tablets by mouth daily as needed (constipation.).     Marland Kitchen simvastatin (ZOCOR) 40 MG tablet TAKE 1 TABLET BY MOUTH EVERYDAY AT BEDTIME 90 tablet 1  . traZODone (DESYREL) 100 MG tablet Take 250 mg by mouth at bedtime.      Social History   Socioeconomic History  . Marital status: Married    Spouse name: Not on file  . Number of children: Not on file  . Years of education: Not on file  . Highest education level: Not on file  Occupational History  . Not  on file  Tobacco Use  . Smoking status: Former Research scientist (life sciences)  . Smokeless tobacco: Never Used  . Tobacco comment: quit smoking early 70's  Vaping Use  . Vaping Use: Never used  Substance and Sexual Activity  . Alcohol use: No  . Drug use: No  . Sexual activity: Not Currently  Other Topics Concern  . Not on file  Social History Narrative  . Not on file   Social Determinants of Health   Financial Resource Strain: Not on file  Food Insecurity: Not  on file  Transportation Needs: Not on file  Physical Activity: Not on file  Stress: Not on file  Social Connections: Not on file  Intimate Partner Violence: Not on file   Family History  Problem Relation Age of Onset  . Colon cancer Neg Hx     OBJECTIVE:  Vitals:   03/24/21 1212  BP: (!) 149/77  Pulse: 74  Resp: 19  Temp: 98.3 F (36.8 C)  TempSrc: Oral  SpO2: 94%     General appearance: alert; appears fatigued, but nontoxic; speaking in full sentences and tolerating own secretions HEENT: NCAT; Ears: EACs clear, TMs pearly gray; Eyes: PERRL.  EOM grossly intact. Sinuses: nontender; Nose: nares patent with clear rhinorrhea, Throat: oropharynx erythematous, cobblestoning present, tonsils non erythematous or enlarged, uvula midline  Neck: supple without LAD Lungs: unlabored respirations, symmetrical air entry; cough: moderate; no respiratory distress; diffuse rhonchi and wheezing throughout bilateral lungs Heart: regular rate and rhythm.  Radial pulses 2+ symmetrical bilaterally Skin: warm and dry Psychological: alert and cooperative; normal mood and affect  LABS:  No results found for this or any previous visit (from the past 24 hour(s)).   ASSESSMENT & PLAN:  1. Pneumonitis   2. Cough   3. Other fatigue   4. Wheezing     Meds ordered this encounter  Medications  . albuterol (VENTOLIN HFA) 108 (90 Base) MCG/ACT inhaler 2 puff  . methylPREDNISolone sodium succinate (SOLU-MEDROL) 125 mg/2 mL injection 125 mg  .  doxycycline (VIBRAMYCIN) 100 MG capsule    Sig: Take 1 capsule (100 mg total) by mouth 2 (two) times daily.    Dispense:  14 capsule    Refill:  0    Order Specific Question:   Supervising Provider    Answer:   Chase Picket [id number]  . benzonatate (TESSALON) 100 MG capsule    Sig: Take 1 capsule (100 mg total) by mouth every 8 (eight) hours.    Dispense:  21 capsule    Refill:  0    Order Specific Question:   Supervising Provider    Answer:   Chase Picket [id number]  . cefpodoxime (VANTIN) 200 MG tablet    Sig: Take 1 tablet (200 mg total) by mouth 2 (two) times daily for 7 days.    Dispense:  14 tablet    Refill:  0    Order Specific Question:   Supervising Provider    Answer:   Chase Picket [[id number]]   X-ray today shows pneumonitis which is consistent with a pneumonia infection Prescribe doxycycline twice daily x7 days Prescribed cefpodoxime twice daily x7 days Prescribed Tessalon Perles as needed for cough Albuterol inhaler given in office today Solu-Medrol 125 mg IM given in office today Continue supportive care at home COVID and flu testing ordered.  It will take between 2-3 days for test results. Someone will contact you regarding abnormal results.   Work   School note provided Patient should remain in quarantine until they have received Covid results.  If negative you may resume normal activities (go back to work/school) while practicing hand hygiene, social distance, and mask wearing.  If positive, patient should remain in quarantine for at least 5 days from symptom onset AND greater than 72 hours after symptoms resolution (absence of fever without the use of fever-reducing medication and improvement in respiratory symptoms), whichever is longer Get plenty of rest and push fluids Use OTC zyrtec for nasal congestion, runny nose, and/or sore throat Use OTC flonase for  nasal congestion and runny nose Use medications daily for symptom relief Use OTC medications  like ibuprofen or tylenol as needed fever or pain Call or go to the ED if you have any new or worsening symptoms such as fever, worsening cough, shortness of breath, chest tightness, chest pain, turning blue, changes in mental status.  Reviewed expectations re: course of current medical issues. Questions answered. Outlined signs and symptoms indicating need for more acute intervention. Patient verbalized understanding. After Visit Summary given.         Faustino Congress, NP 03/24/21 1249

## 2021-03-24 NOTE — ED Triage Notes (Signed)
Cough and chest congestion x 1 week

## 2021-03-24 NOTE — Discharge Instructions (Addendum)
X-ray today shows pneumonitis which is consistent with a pneumonia infection  I have sent in doxycycline for you to take one tablet twice a day for 7 days  I have sent in an albuterol inhaler for you to use 2 puffs every 4-6 hours as needed for cough, shortness of breath, wheezing.  I have sent in cefpodoxime for you to use twice a day for 7 days  I have sent in tessalon perles for you to use one capsule every 8 hours as needed for cough.  You have received a steroid injection in the office today  Your COVID and Influenza tests are pending.  You should self quarantine until the test results are back.    Take Tylenol or ibuprofen as needed for fever or discomfort.  Rest and keep yourself hydrated.    Follow-up with your primary care provider if your symptoms are not improving.

## 2021-03-25 LAB — COVID-19, FLU A+B NAA
Influenza A, NAA: NOT DETECTED
Influenza B, NAA: NOT DETECTED
SARS-CoV-2, NAA: NOT DETECTED

## 2021-03-26 ENCOUNTER — Other Ambulatory Visit: Payer: Self-pay | Admitting: Family Medicine

## 2021-04-09 DIAGNOSIS — I10 Essential (primary) hypertension: Secondary | ICD-10-CM | POA: Diagnosis not present

## 2021-04-09 DIAGNOSIS — E119 Type 2 diabetes mellitus without complications: Secondary | ICD-10-CM | POA: Diagnosis not present

## 2021-04-10 LAB — BASIC METABOLIC PANEL
BUN/Creatinine Ratio: 16 (ref 10–24)
BUN: 18 mg/dL (ref 8–27)
CO2: 22 mmol/L (ref 20–29)
Calcium: 9.5 mg/dL (ref 8.6–10.2)
Chloride: 103 mmol/L (ref 96–106)
Creatinine, Ser: 1.14 mg/dL (ref 0.76–1.27)
Glucose: 120 mg/dL — ABNORMAL HIGH (ref 65–99)
Potassium: 4.5 mmol/L (ref 3.5–5.2)
Sodium: 140 mmol/L (ref 134–144)
eGFR: 68 mL/min/{1.73_m2} (ref >59)

## 2021-04-10 LAB — HEMOGLOBIN A1C
Est. average glucose Bld gHb Est-mCnc: 148 mg/dL
Hgb A1c MFr Bld: 6.8 % — ABNORMAL HIGH (ref 4.8–5.6)

## 2021-04-14 DIAGNOSIS — M25511 Pain in right shoulder: Secondary | ICD-10-CM | POA: Diagnosis not present

## 2021-04-16 ENCOUNTER — Ambulatory Visit: Payer: Medicare Other | Admitting: Family Medicine

## 2021-04-17 ENCOUNTER — Other Ambulatory Visit: Payer: Self-pay

## 2021-04-17 ENCOUNTER — Other Ambulatory Visit: Payer: Self-pay | Admitting: Family Medicine

## 2021-04-17 ENCOUNTER — Ambulatory Visit (INDEPENDENT_AMBULATORY_CARE_PROVIDER_SITE_OTHER): Payer: Medicare Other | Admitting: Family Medicine

## 2021-04-17 VITALS — BP 129/72 | HR 74 | Temp 97.9°F | Wt 301.2 lb

## 2021-04-17 DIAGNOSIS — I10 Essential (primary) hypertension: Secondary | ICD-10-CM | POA: Diagnosis not present

## 2021-04-17 DIAGNOSIS — E119 Type 2 diabetes mellitus without complications: Secondary | ICD-10-CM | POA: Diagnosis not present

## 2021-04-17 DIAGNOSIS — Z8701 Personal history of pneumonia (recurrent): Secondary | ICD-10-CM

## 2021-04-17 DIAGNOSIS — E785 Hyperlipidemia, unspecified: Secondary | ICD-10-CM

## 2021-04-17 MED ORDER — METFORMIN HCL 500 MG PO TABS: 1.0000 | ORAL_TABLET | Freq: Two times a day (BID) | ORAL | 3 refills | Status: DC

## 2021-04-17 NOTE — Progress Notes (Signed)
Patient ID: Michael Barr, male    DOB: 24-Sep-1946, 75 y.o.   MRN: [id number]   Chief Complaint  Patient presents with   Hypertension   Subjective:    HPI  Pt here for follow up on blood pressure and DM. Pt states he had pneumonia a few weeks ago. Pt states his blood pressure has been doing ok. Pt states no issues. Just has received glasses for reading. Pt still using inhaler from Urgent Care.  Pt having cough at night when laying down(lingering from pneumonia)  Pt went on vacation in [place], and had coughing and dx with pneumonia. Has allergy to boxwoods.  Had hard time breathing with sob.  Went to urgent care.  Gave doxycycline, steroids injection, and albuterol.  Cefpodoxime for 7 days. \ Tessalon perles.  Dm2- Compliant with medications. Checking blood glucose.   Not seeing any high or low numbers.  Denies polyuria or polydipsia.  Eye exam: overdue Foot exam: no new concerns.  A1c at 6.8.   Lingering coughing- H/o pneumonia a few weeks ago. No fever.  Noticing the breathing cleared up. Using inhaler prn.    Medical History Michael Barr has a past medical history of Anxiety, Arthritis, Asthma (70's), Cataract, Chronic back pain, Constipation, Coronary artery disease, Depression, Fibromyalgia, GERD (gastroesophageal reflux disease), History of kidney stones, Hyperlipidemia, Hypertension, Hypokalemia, Insomnia, Joint pain, Joint swelling, Nocturia, Pneumonia (70's), Type 2 diabetes mellitus without complications ([place]), and Weakness.   Outpatient Encounter Medications as of 04/17/2021  Medication Sig   acetaminophen (TYLENOL) 500 MG tablet Take 500 mg by mouth 3 (three) times daily.   ALPRAZolam (XANAX) 0.25 MG tablet Take 0.25 mg by mouth 3 (three) times daily.   aspirin EC 325 MG tablet Take 325 mg by mouth daily.   benzonatate (TESSALON) 100 MG capsule Take 1 capsule (100 mg total) by mouth every 8 (eight) hours.   diphenhydrAMINE (BENADRYL) 25 mg capsule  Take 25 mg by mouth at bedtime as needed for itching or allergies.   gabapentin (NEURONTIN) 300 MG capsule Take 300 mg by mouth See admin instructions. Take 1 capsule (300 mg) by mouth up to 3 times daily (typically takes 2 times daily)   lisinopril-hydrochlorothiazide (ZESTORETIC) 20-25 MG tablet TAKE 1 TABLET BY MOUTH EVERY DAY IN THE MORNING   methadone (DOLOPHINE) 5 MG tablet Take 5 mg by mouth 3 (three) times daily.   Multiple Vitamins-Minerals (MULTIVITAMIN WITH MINERALS) tablet Take 1 tablet by mouth daily.   nitroGLYCERIN (NITROSTAT) 0.4 MG SL tablet Place 1 tablet (0.4 mg total) under the tongue every 5 (five) minutes as needed for chest pain.   Oxycodone HCl 10 MG TABS Take 10 mg by mouth 3 (three) times daily.   Potassium 99 MG TABS Take 99 mg by mouth daily as needed (fatigue).    Propylhexedrine (BENZEDREX NA) Place 1 Inhaler into the nose as needed (congestion.).   senna (SENOKOT) 8.6 MG TABS tablet Take 1-2 tablets by mouth daily as needed (constipation.).    simvastatin (ZOCOR) 40 MG tablet TAKE 1 TABLET BY MOUTH EVERYDAY AT BEDTIME   traZODone (DESYREL) 100 MG tablet Take 250 mg by mouth at bedtime.    [DISCONTINUED] doxycycline (VIBRAMYCIN) 100 MG capsule Take 1 capsule (100 mg total) by mouth 2 (two) times daily.   [DISCONTINUED] metFORMIN (GLUCOPHAGE) 500 MG tablet TAKE 1 TABLET BY MOUTH TWICE A DAY WITH MEALS   [START ON 05/17/2021] metFORMIN (GLUCOPHAGE) 500 MG tablet Take 1 tablet (500 mg total) by mouth 2 (  two) times daily with a meal.   No facility-administered encounter medications on file as of 04/17/2021.     Review of Systems  Constitutional:  Negative for chills and fever.  HENT:  Negative for congestion, rhinorrhea and sore throat.   Respiratory:  Positive for cough (intermittent). Negative for shortness of breath and wheezing.   Cardiovascular:  Negative for chest pain and leg swelling.  Gastrointestinal:  Negative for abdominal pain, diarrhea, nausea and  vomiting.  Genitourinary:  Negative for dysuria and frequency.  Skin:  Negative for rash.  Neurological:  Negative for dizziness, weakness and headaches.    Vitals BP 129/72   Pulse 74   Temp 97.9 F (36.6 C)   Wt (!) 301 lb 3.2 oz (136.6 kg)   SpO2 96%   BMI 44.48 kg/m   Objective:   Physical Exam Vitals and nursing note reviewed.  Constitutional:      General: He is not in acute distress.    Appearance: Normal appearance. He is not ill-appearing.  HENT:     Head: Normocephalic.     Nose: Nose normal. No congestion.     Mouth/Throat:     Mouth: Mucous membranes are moist.     Pharynx: No oropharyngeal exudate.  Eyes:     Extraocular Movements: Extraocular movements intact.     Conjunctiva/sclera: Conjunctivae normal.     Pupils: Pupils are equal, round, and reactive to light.  Cardiovascular:     Rate and Rhythm: Normal rate and regular rhythm.     Pulses: Normal pulses.     Heart sounds: Normal heart sounds. No murmur heard. Pulmonary:     Effort: Pulmonary effort is normal.     Breath sounds: Normal breath sounds. No wheezing, rhonchi or rales.  Musculoskeletal:        General: Normal range of motion.     Right lower leg: No edema.     Left lower leg: No edema.  Skin:    General: Skin is warm and dry.     Findings: No rash.  Neurological:     General: No focal deficit present.     Mental Status: He is alert and oriented to person, place, and time.     Cranial Nerves: No cranial nerve deficit.  Psychiatric:        Mood and Affect: Mood normal.        Behavior: Behavior normal.        Thought Content: Thought content normal.        Judgment: Judgment normal.     Assessment and Plan   1. Essential hypertension  2. History of pneumonia  3. Type 2 diabetes mellitus without complication, unspecified whether long term insulin use ([place])  4. Hyperlipidemia LDL goal <70   Htn- stable. Cont meds.  Dm2- stable, at goal.  Cont meds.  Hld- stable. Cont  meds.  Pneumonia recently- getting better, having a lingering coughing.  Cont with mucinex and inhaler.  Call if worsening. Not needing another inhaler at this time.  Call pt in 3 months for repeat xray to see if pneumonia cleared up.  Call pt in 9/22 to repeat xray chest.  Pt to f/u in 22mo for htn, dm2, and chronic illness.   Return in about 6 months (around 10/17/2021) for f/u htn, dm2, .

## 2021-06-01 DIAGNOSIS — G894 Chronic pain syndrome: Secondary | ICD-10-CM | POA: Diagnosis not present

## 2021-06-01 DIAGNOSIS — M5412 Radiculopathy, cervical region: Secondary | ICD-10-CM | POA: Diagnosis not present

## 2021-06-01 DIAGNOSIS — F419 Anxiety disorder, unspecified: Secondary | ICD-10-CM | POA: Diagnosis not present

## 2021-06-01 DIAGNOSIS — M25519 Pain in unspecified shoulder: Secondary | ICD-10-CM | POA: Diagnosis not present

## 2021-06-01 DIAGNOSIS — Z79899 Other long term (current) drug therapy: Secondary | ICD-10-CM | POA: Diagnosis not present

## 2021-06-17 ENCOUNTER — Other Ambulatory Visit: Payer: Self-pay | Admitting: *Deleted

## 2021-06-17 DIAGNOSIS — Z8701 Personal history of pneumonia (recurrent): Secondary | ICD-10-CM

## 2021-06-20 DIAGNOSIS — M542 Cervicalgia: Secondary | ICD-10-CM | POA: Diagnosis not present

## 2021-06-20 DIAGNOSIS — G894 Chronic pain syndrome: Secondary | ICD-10-CM | POA: Diagnosis not present

## 2021-07-11 ENCOUNTER — Other Ambulatory Visit: Payer: Self-pay | Admitting: Family Medicine

## 2021-07-14 ENCOUNTER — Other Ambulatory Visit: Payer: Self-pay | Admitting: Family Medicine

## 2021-07-14 DIAGNOSIS — E785 Hyperlipidemia, unspecified: Secondary | ICD-10-CM

## 2021-07-14 DIAGNOSIS — I10 Essential (primary) hypertension: Secondary | ICD-10-CM

## 2021-08-28 DIAGNOSIS — M5412 Radiculopathy, cervical region: Secondary | ICD-10-CM | POA: Diagnosis not present

## 2021-08-28 DIAGNOSIS — F419 Anxiety disorder, unspecified: Secondary | ICD-10-CM | POA: Diagnosis not present

## 2021-08-28 DIAGNOSIS — Z79899 Other long term (current) drug therapy: Secondary | ICD-10-CM | POA: Diagnosis not present

## 2021-08-28 DIAGNOSIS — M25519 Pain in unspecified shoulder: Secondary | ICD-10-CM | POA: Diagnosis not present

## 2021-08-28 DIAGNOSIS — G894 Chronic pain syndrome: Secondary | ICD-10-CM | POA: Diagnosis not present

## 2021-09-25 DIAGNOSIS — Z23 Encounter for immunization: Secondary | ICD-10-CM | POA: Diagnosis not present

## 2021-10-04 ENCOUNTER — Other Ambulatory Visit: Payer: Self-pay | Admitting: Nurse Practitioner

## 2021-10-04 DIAGNOSIS — E785 Hyperlipidemia, unspecified: Secondary | ICD-10-CM

## 2021-10-04 DIAGNOSIS — I10 Essential (primary) hypertension: Secondary | ICD-10-CM

## 2021-11-12 DIAGNOSIS — M5412 Radiculopathy, cervical region: Secondary | ICD-10-CM | POA: Diagnosis not present

## 2021-12-15 ENCOUNTER — Ambulatory Visit (INDEPENDENT_AMBULATORY_CARE_PROVIDER_SITE_OTHER): Payer: Medicare Other

## 2021-12-15 ENCOUNTER — Ambulatory Visit
Admission: EM | Admit: 2021-12-15 | Discharge: 2021-12-15 | Disposition: A | Discharge status: Home / Self Care | Payer: Medicare Other

## 2021-12-15 ENCOUNTER — Other Ambulatory Visit: Payer: Self-pay

## 2021-12-15 DIAGNOSIS — R059 Cough, unspecified: Secondary | ICD-10-CM | POA: Diagnosis not present

## 2021-12-15 DIAGNOSIS — J209 Acute bronchitis, unspecified: Secondary | ICD-10-CM

## 2021-12-15 DIAGNOSIS — R0602 Shortness of breath: Secondary | ICD-10-CM

## 2021-12-15 DIAGNOSIS — R0789 Other chest pain: Secondary | ICD-10-CM | POA: Diagnosis not present

## 2021-12-15 MED ORDER — ALBUTEROL SULFATE HFA 108 (90 BASE) MCG/ACT IN AERS: 1.0000 | INHALATION_SPRAY | Freq: Four times a day (QID) | RESPIRATORY_TRACT | 0 refills | Status: DC | PRN

## 2021-12-15 MED ORDER — PREDNISONE 20 MG PO TABS: 40.0000 mg | ORAL_TABLET | Freq: Every day | ORAL | 0 refills | Status: DC

## 2021-12-15 MED ORDER — PROMETHAZINE-DM 6.25-15 MG/5ML PO SYRP: 5.0000 mL | ORAL_SOLUTION | Freq: Four times a day (QID) | ORAL | 0 refills | Status: DC | PRN

## 2021-12-15 NOTE — ED Triage Notes (Signed)
Patient states that about 3 days ago he thought that he had a regular cold  Patient states that it is getting worse and worse and he is unable to catch his breath  Patient states that he had pneumonia last year and the symptoms he is having reminds him of that

## 2021-12-19 NOTE — ED Provider Notes (Signed)
[facility]    CSN: [id number] Arrival date & time: 12/15/21  1154      History   Chief Complaint Chief Complaint  Patient presents with   Cough    Cough, congestion, headache and SOB    HPI Michael Barr is a 76 y.o. male.   Presenting today with about 3 days of congestion, hacking cough, some shortness of breath especially after coughing spells.  Denies fever, chills, chest pain, abdominal pain, nausea vomiting diarrhea.  States he had pneumonia last year at this time and is concerned that he is getting pneumonia again.  Taking over-the-counter cold and congestion medications with minimal relief.  No known history of chronic pulmonary disease, history of hypertension, CAD, diabetes.   Past Medical History:  Diagnosis Date   Anxiety    takes Xanax and Methadone daily   Arthritis    low back- DDD   Asthma 70's   Cataract    immature and to the left eye   Chronic back pain    HNP   Constipation    takes Sennokot nightly   Coronary artery disease    Depression    attacks   Fibromyalgia    GERD (gastroesophageal reflux disease)    20 YRS AGO, AND DIET CONTROL   History of kidney stones    Hyperlipidemia    takes Zocor daily   Hypertension    takes Zestoretic daily   Hypokalemia    takes Potassium daily   Insomnia    takes trazodone nightly   Joint pain    Joint swelling    Nocturia    Pneumonia 70's   Type 2 diabetes mellitus without complications ([place])    Weakness    right leg    Patient Active Problem List   Diagnosis Date Noted   Type 2 diabetes mellitus without complications ([place]) 24/40/1027   History of lumbar surgery 04/15/2020   Chronic pain syndrome 04/15/2020   Diarrhea 05/02/2019   Fibromyalgia 11/02/2017   Coronary artery disease 05/02/2017   Essential hypertension 05/02/2017   Hyperlipidemia LDL goal <70 05/02/2017   Pseudoarthrosis of lumbar spine 03/26/2016   Spinal stenosis of lumbar region 12/14/2013   HNP (herniated  nucleus pulposus), lumbar 09/26/2013    Past Surgical History:  Procedure Laterality Date   APPLICATION OF INTRAOPERATIVE CT SCAN N/A 08/21/2018   Procedure: APPLICATION OF INTRAOPERATIVE CT SCAN;  Surgeon: Kary Kos, MD;  Location: [facility];  Service: Neurosurgery;  Laterality: N/A;   BACK SURGERY     multiple back surgeries   CARDIAC CATHETERIZATION     unsure of how long ago   CARPAL TUNNEL RELEASE Bilateral [place] WITH PROPOFOL N/A 07/09/2019   Procedure: COLONOSCOPY WITH PROPOFOL;  Surgeon: Daneil Dolin, MD;  [facility];  Service: Endoscopy;  Laterality: N/A;  12:15pm   CORONARY ARTERY BYPASS GRAFT  1996   1 vessel   HARDWARE REMOVAL N/A 03/26/2016   Procedure: HARDWARE REMOVAL Lumbar one-two ;  Surgeon: Kary Kos, MD;  Location: [facility];  Service: Neurosurgery;  Laterality: N/A;   JOINT REPLACEMENT  1999   lt knee   KNEE ARTHROSCOPY Right    LUMBAR LAMINECTOMY/DECOMPRESSION MICRODISCECTOMY Right 09/26/2013   Procedure: LUMBAR LAMINECTOMY/DECOMPRESSION MICRODISCECTOMY RIGHT THREE-FOUR;  Surgeon: Elaina Hoops, MD;  Location: [facility];  Service: Neurosurgery;  Laterality: Right;   pilondial cyst     x 2  POLYPECTOMY  07/09/2019   Procedure: POLYPECTOMY;  Surgeon: Daneil Dolin, MD;  Location[facility];  Service: Endoscopy;;  transverse colon polyps x 2   POSTERIOR LUMBAR FUSION 4 LEVEL N/A 08/21/2018   Procedure: Posterior Lateral Fusion Thoracic Eight to Pelvis with Iliac screws and Fenestrated screws with kyphon and AIRO;  Surgeon: Kary Kos, MD;  Location: [facility];  Service: Neurosurgery;  Laterality: N/A;   SHOULDER ARTHROSCOPY Bilateral    left x 2 and right x 1   TONSILLECTOMY     tumor removed from right ankle   70's       Home Medications    Prior to Admission medications   Medication Sig Start Date End Date Taking? Authorizing Provider  albuterol (VENTOLIN HFA) 108 (90 Base) MCG/ACT  inhaler Inhale 1-2 puffs into the lungs every 6 (six) hours as needed for wheezing or shortness of breath. 12/15/21  Yes Volney American, PA-C  predniSONE (DELTASONE) 20 MG tablet Take 2 tablets (40 mg total) by mouth daily with breakfast. 12/15/21  Yes Volney American, PA-C  promethazine-dextromethorphan (PROMETHAZINE-DM) 6.25-15 MG/5ML syrup Take 5 mLs by mouth 4 (four) times daily as needed. 12/15/21  Yes Volney American, PA-C  acetaminophen (TYLENOL) 500 MG tablet Take 500 mg by mouth 3 (three) times daily.    [provider]  ALPRAZolam Duanne Moron) 0.25 MG tablet Take 0.25 mg by mouth 3 (three) times daily. 04/15/19   [provider]  aspirin EC 325 MG tablet Take 325 mg by mouth daily.    [provider]  benzonatate (TESSALON) 100 MG capsule Take 1 capsule (100 mg total) by mouth every 8 (eight) hours. 03/24/21   Faustino Congress, NP  diphenhydrAMINE (BENADRYL) 25 mg capsule Take 25 mg by mouth at bedtime as needed for itching or allergies.    [provider]  gabapentin (NEURONTIN) 300 MG capsule Take 300 mg by mouth See admin instructions. Take 1 capsule (300 mg) by mouth up to 3 times daily (typically takes 2 times daily)    [provider]  lisinopril-hydrochlorothiazide (ZESTORETIC) 20-25 MG tablet TAKE 1 TABLET BY MOUTH EVERY DAY IN THE MORNING 10/05/21   Thersa Salt G, DO  metFORMIN (GLUCOPHAGE) 500 MG tablet TAKE 1 TABLET BY MOUTH 2 TIMES DAILY WITH A MEAL. 07/13/21   Nilda Simmer, NP  methadone (DOLOPHINE) 5 MG tablet Take 5 mg by mouth 3 (three) times daily. 04/15/19   [provider]  morphine (MS CONTIN) 15 MG 12 hr tablet Take 15 mg by mouth 3 (three) times daily. 12/01/21   [provider]  Multiple Vitamins-Minerals (MULTIVITAMIN WITH MINERALS) tablet Take 1 tablet by mouth daily.    [provider]  nitroGLYCERIN (NITROSTAT) 0.4 MG SL tablet Place 1 tablet (0.4 mg total) under the tongue  every 5 (five) minutes as needed for chest pain. 10/15/19   Mikey Kirschner, MD  Oxycodone HCl 10 MG TABS Take 10 mg by mouth 3 (three) times daily.    [provider]  Potassium 99 MG TABS Take 99 mg by mouth daily as needed (fatigue).     [provider]  Propylhexedrine (BENZEDREX NA) Place 1 Inhaler into the nose as needed (congestion.).    [provider]  senna (SENOKOT) 8.6 MG TABS tablet Take 1-2 tablets by mouth daily as needed (constipation.).     [provider]  simvastatin (ZOCOR) 40 MG tablet TAKE 1 TABLET BY MOUTH EVERYDAY AT BEDTIME 10/05/21  Cook, [name], DO  traZODone (DESYREL) 100 MG tablet Take 250 mg by mouth at bedtime.     [provider]   Family History Family History  Problem Relation Age of Onset   Colon cancer Neg Hx    Social History Social History   Tobacco Use   Smoking status: Former   Smokeless tobacco: Never   Tobacco comments:    quit smoking early 70's  Vaping Use   Vaping Use: Never used  Substance Use Topics   Alcohol use: No   Drug use: No     Allergies   Penicillins   Review of Systems Review of Systems Per HPI  Physical Exam Triage Vital Signs ED Triage Vitals  Enc Vitals Group     BP 12/15/21 1203 130/69     Pulse Rate 12/15/21 1203 72     Resp 12/15/21 1203 18     Temp 12/15/21 1203 98.2 F (36.8 C)     Temp Source 12/15/21 1203 Oral     SpO2 12/15/21 1203 95 %     Weight --      Height --      Head Circumference --      Peak Flow --      Pain Score 12/15/21 1201 0     Pain Loc --      Pain Edu? --      Excl. in [place]? --    No data found.  Updated Vital Signs BP 130/69 (BP Location: Right Arm)    Pulse 72    Temp 98.2 F (36.8 C) (Oral)    Resp 18    SpO2 95%   Visual Acuity Right Eye Distance:   Left Eye Distance:   Bilateral Distance:    Right Eye Near:   Left Eye Near:    Bilateral Near:     Physical Exam Vitals and nursing note reviewed.   Constitutional:      Appearance: Normal appearance. He is well-developed.  HENT:     Head: Atraumatic.     Right Ear: External ear normal.     Left Ear: External ear normal.     Nose: Rhinorrhea present.     Mouth/Throat:     Pharynx: Posterior oropharyngeal erythema present. No oropharyngeal exudate.  Eyes:     Extraocular Movements: Extraocular movements intact.     Conjunctiva/sclera: Conjunctivae normal.     Pupils: Pupils are equal, round, and reactive to light.  Cardiovascular:     Rate and Rhythm: Normal rate and regular rhythm.  Pulmonary:     Effort: Pulmonary effort is normal. No respiratory distress.     Breath sounds: Wheezing present. No rales.     Comments: Moderate diffuse wheezes Speaking in full sentences, breathing comfortably on room air Musculoskeletal:        General: Normal range of motion.     Cervical back: Normal range of motion and neck supple.  Lymphadenopathy:     Cervical: No cervical adenopathy.  Skin:    General: Skin is warm and dry.  Neurological:     General: No focal deficit present.     Mental Status: He is alert and oriented to person, place, and time.  Psychiatric:        Mood and Affect: Mood normal.        Behavior: Behavior normal.        Thought Content: Thought content normal.        Judgment: Judgment normal.  UC Treatments / Results  Labs (all labs ordered are listed, but only abnormal results are displayed) Labs Reviewed - No data to display  EKG   Radiology No results found.  Procedures Procedures (including critical care time)  Medications Ordered in UC Medications - No data to display  Initial Impression / Assessment and Plan / UC Course  I have reviewed the triage vital signs and the nursing notes.  Pertinent labs & imaging results that were available during my care of the patient were reviewed by me and considered in my medical decision making (see chart for details).     Vital signs reassuring and  within normal limits, and chest x-ray today negative for acute pulmonary abnormality.  We will treat for bronchitis with prednisone, Phenergan DM, albuterol inhaler.  Discussed supportive over-the-counter medications and home care additionally.  Return for any acutely worsening symptoms.  Final Clinical Impressions(s) / UC Diagnoses   Final diagnoses:  Acute bronchitis, unspecified organism   Discharge Instructions   None    ED Prescriptions     Medication Sig Dispense Auth. Provider   predniSONE (DELTASONE) 20 MG tablet Take 2 tablets (40 mg total) by mouth daily with breakfast. 10 tablet Volney American, PA-C   promethazine-dextromethorphan (PROMETHAZINE-DM) 6.25-15 MG/5ML syrup Take 5 mLs by mouth 4 (four) times daily as needed. 100 mL Volney American, PA-C   albuterol (VENTOLIN HFA) 108 (90 Base) MCG/ACT inhaler Inhale 1-2 puffs into the lungs every 6 (six) hours as needed for wheezing or shortness of breath. 18 g Volney American, [place]      PDMP not reviewed this encounter.   Volney American, [place] 12/19/21 1423

## 2021-12-30 ENCOUNTER — Other Ambulatory Visit: Payer: Self-pay | Admitting: Family Medicine

## 2021-12-30 DIAGNOSIS — I10 Essential (primary) hypertension: Secondary | ICD-10-CM

## 2021-12-30 DIAGNOSIS — E785 Hyperlipidemia, unspecified: Secondary | ICD-10-CM

## 2022-01-04 NOTE — Telephone Encounter (Signed)
Has medication follow up on 01/05/22 ?

## 2022-01-05 ENCOUNTER — Other Ambulatory Visit: Payer: Self-pay

## 2022-01-05 ENCOUNTER — Ambulatory Visit (INDEPENDENT_AMBULATORY_CARE_PROVIDER_SITE_OTHER): Payer: Medicare Other | Admitting: Family Medicine

## 2022-01-05 VITALS — BP 120/78 | HR 96 | Temp 97.9°F | Ht 69.0 in | Wt 302.0 lb

## 2022-01-05 DIAGNOSIS — Z79899 Other long term (current) drug therapy: Secondary | ICD-10-CM

## 2022-01-05 DIAGNOSIS — I2510 Atherosclerotic heart disease of native coronary artery without angina pectoris: Secondary | ICD-10-CM

## 2022-01-05 DIAGNOSIS — E119 Type 2 diabetes mellitus without complications: Secondary | ICD-10-CM | POA: Diagnosis not present

## 2022-01-05 DIAGNOSIS — E785 Hyperlipidemia, unspecified: Secondary | ICD-10-CM

## 2022-01-05 DIAGNOSIS — I10 Essential (primary) hypertension: Secondary | ICD-10-CM

## 2022-01-05 DIAGNOSIS — G894 Chronic pain syndrome: Secondary | ICD-10-CM

## 2022-01-05 NOTE — Patient Instructions (Signed)
Continue your current medications. ? ?Labs today. ? ?Follow up in 6 months. ? ?Take care ? ?Dr. Lacinda Axon  ?

## 2022-01-06 ENCOUNTER — Other Ambulatory Visit: Payer: Self-pay

## 2022-01-06 DIAGNOSIS — M797 Fibromyalgia: Secondary | ICD-10-CM | POA: Insufficient documentation

## 2022-01-06 LAB — CBC
Hematocrit: 40.6 % (ref 37.5–51.0)
Hemoglobin: 13.6 g/dL (ref 13.0–17.7)
MCH: 29 pg (ref 26.6–33.0)
MCHC: 33.5 g/dL (ref 31.5–35.7)
MCV: 87 fL (ref 79–97)
Platelets: 238 10*3/uL (ref 150–450)
RBC: 4.69 x10E6/uL (ref 4.14–5.80)
RDW: 13.7 % (ref 11.6–15.4)
WBC: 8.2 10*3/uL (ref 3.4–10.8)

## 2022-01-06 LAB — CMP14+EGFR
ALT: 22 IU/L (ref 0–44)
AST: 27 IU/L (ref 0–40)
Albumin/Globulin Ratio: 1.9 (ref 1.2–2.2)
Albumin: 4.8 g/dL — ABNORMAL HIGH (ref 3.7–4.7)
Alkaline Phosphatase: 47 IU/L (ref 44–121)
BUN/Creatinine Ratio: 16 (ref 10–24)
BUN: 20 mg/dL (ref 8–27)
Bilirubin Total: 0.5 mg/dL (ref 0.0–1.2)
CO2: 22 mmol/L (ref 20–29)
Calcium: 9.9 mg/dL (ref 8.6–10.2)
Chloride: 102 mmol/L (ref 96–106)
Creatinine, Ser: 1.24 mg/dL (ref 0.76–1.27)
Globulin, Total: 2.5 g/dL (ref 1.5–4.5)
Glucose: 127 mg/dL — ABNORMAL HIGH (ref 70–99)
Potassium: 4.7 mmol/L (ref 3.5–5.2)
Sodium: 141 mmol/L (ref 134–144)
Total Protein: 7.3 g/dL (ref 6.0–8.5)
eGFR: 61 mL/min/{1.73_m2} (ref >59)

## 2022-01-06 LAB — LIPID PANEL
Chol/HDL Ratio: 3.6 ratio (ref 0.0–5.0)
Cholesterol, Total: 139 mg/dL (ref 100–199)
HDL: 39 mg/dL — ABNORMAL LOW (ref >39)
LDL Chol Calc (NIH): 69 mg/dL (ref 0–99)
Triglycerides: 184 mg/dL — ABNORMAL HIGH (ref 0–149)
VLDL Cholesterol Cal: 31 mg/dL (ref 5–40)

## 2022-01-06 LAB — HEMOGLOBIN A1C
Est. average glucose Bld gHb Est-mCnc: 177 mg/dL
Hgb A1c MFr Bld: 7.8 % — ABNORMAL HIGH (ref 4.8–5.6)

## 2022-01-06 NOTE — Assessment & Plan Note (Signed)
No anginal symptoms currently.  Continue current medications. ?

## 2022-01-06 NOTE — Progress Notes (Signed)
? ?Subjective:  ?Patient ID: Michael Barr, male    DOB: 01-19-1946  Age: 76 y.o. MRN: [id number] ? ?CC: ?Chief Complaint  ?Patient presents with  ? Hyperlipidemia  ? Hypertension  ? ? ?HPI: ? ?77 year old male with morbid obesity, chronic pain, type 2 diabetes, hypertension, coronary artery disease status post CABG presents to establish care with me. ? ?Patient's hypertension is well controlled.  He is compliant with lisinopril/HCTZ.  Denies chest pain or shortness of breath. ? ?Patient has coronary disease status post CABG.  Has hyperlipidemia.  Goal LDL less than 70.  Currently on simvastatin. ? ?Patient has type 2 diabetes.  Most recent A1c was 6.8.  Needs A1c.  He is not currently on metformin.  He was initially prescribed this but has not been taking it. ? ?Patient Active Problem List  ? Diagnosis Date Noted  ? Fibromyalgia 01/06/2022  ? Type 2 diabetes mellitus without complications ([place]) 40/98/1191  ? History of lumbar surgery 04/15/2020  ? Chronic pain syndrome 04/15/2020  ? Coronary artery disease 05/02/2017  ? Essential hypertension 05/02/2017  ? Hyperlipidemia LDL goal <70 05/02/2017  ? ? ?Social Hx   ?Social History  ? ?Socioeconomic History  ? Marital status: Married  ?  Spouse name: Not on file  ? Number of children: Not on file  ? Years of education: Not on file  ? Highest education level: Not on file  ?Occupational History  ? Not on file  ?Tobacco Use  ? Smoking status: Former  ? Smokeless tobacco: Never  ? Tobacco comments:  ?  quit smoking early 70's  ?Vaping Use  ? Vaping Use: Never used  ?Substance and Sexual Activity  ? Alcohol use: No  ? Drug use: No  ? Sexual activity: Not Currently  ?Other Topics Concern  ? Not on file  ?Social History Narrative  ? Not on file  ? ?Social Determinants of Health  ? ?Financial Resource Strain: Not on file  ?Food Insecurity: Not on file  ?Transportation Needs: Not on file  ?Physical Activity: Not on file  ?Stress: Not on file  ?Social Connections: Not on file   ? ? ?Review of Systems  ?Cardiovascular: Negative.   ?Musculoskeletal:  Positive for arthralgias and back pain.  ? ? ?Objective:  ?BP 120/78   Pulse 96   Temp 97.9 ?F (36.6 ?C)   Ht _0  (1.753 m)   Wt (!) 302 lb (137 kg)   SpO2 95%   BMI 44.60 kg/m?  ? ?BP/Weight 01/05/2022 12/15/2021 04/17/2021  ?Systolic BP 478 295 621  ?Diastolic BP 78 69 72  ?Wt. (Lbs) 302 - 301.2  ?BMI 44.6 - 44.48  ? ? ?Physical Exam ?Constitutional:   ?   General: He is not in acute distress. ?   Appearance: Normal appearance. He is obese. He is not ill-appearing.  ?Cardiovascular:  ?   Rate and Rhythm: Normal rate and regular rhythm.  ?Pulmonary:  ?   Effort: Pulmonary effort is normal.  ?   Breath sounds: Normal breath sounds. No wheezing, rhonchi or rales.  ?Neurological:  ?   Mental Status: He is alert.  ?Psychiatric:     ?   Mood and Affect: Mood normal.     ?   Behavior: Behavior normal.  ? ? ?Lab Results  ?Component Value Date  ? WBC 8.2 01/05/2022  ? HGB 13.6 01/05/2022  ? HCT 40.6 01/05/2022  ? PLT 238 01/05/2022  ? GLUCOSE 127 (H) 01/05/2022  ? CHOL 139 01/05/2022  ?  TRIG 184 (H) 01/05/2022  ? HDL 39 (L) 01/05/2022  ? [place] 69 01/05/2022  ? ALT 22 01/05/2022  ? AST 27 01/05/2022  ? NA 141 01/05/2022  ? K 4.7 01/05/2022  ? CL 102 01/05/2022  ? CREATININE 1.24 01/05/2022  ? BUN 20 01/05/2022  ? CO2 22 01/05/2022  ? INR 0.98 09/27/2010  ? HGBA1C 7.8 (H) 01/05/2022  ? ? ? ?Assessment & Plan:  ? ?Problem List Items Addressed This Visit   ? ?  ? Cardiovascular and Mediastinum  ? Coronary artery disease  ?  No anginal symptoms currently.  Continue current medications. ?  ?  ? Essential hypertension  ?  Blood pressure at goal.  Continue lisinopril/HCTZ. ?  ?  ?  ? Endocrine  ? Type 2 diabetes mellitus without complications ([place]) - Primary  ?  A1c has returned at 7.8.  Restarting metformin. ?  ?  ? Relevant Orders  ? CMP14+EGFR (Completed)  ? Hemoglobin A1c (Completed)  ?  ? Other  ? Hyperlipidemia LDL goal <70  ?  LDL at goal.   Patient's triglycerides are elevated.  This is likely due to diabetes not being at goal.  We will continue to monitor closely.  Continue simvastatin. ?  ?  ? Relevant Orders  ? Lipid panel (Completed)  ? ?Other Visit Diagnoses   ? ? High risk medication use      ? Relevant Orders  ? CBC (Completed)  ? ?  ? ? ?Follow-up:  Return in about 6 months (around 07/08/2022). ? ?Thersa Salt DO ?[place] ? ?

## 2022-01-06 NOTE — Assessment & Plan Note (Signed)
Blood pressure at goal.  Continue lisinopril/HCTZ. ?

## 2022-01-06 NOTE — Assessment & Plan Note (Signed)
A1c has returned at 7.8.  Restarting metformin. ?

## 2022-01-06 NOTE — Assessment & Plan Note (Signed)
LDL at goal.  Patient's triglycerides are elevated.  This is likely due to diabetes not being at goal.  We will continue to monitor closely.  Continue simvastatin. ?

## 2022-01-08 DIAGNOSIS — H50131 Monocular exotropia with V pattern, right eye: Secondary | ICD-10-CM | POA: Diagnosis not present

## 2022-01-08 DIAGNOSIS — H2513 Age-related nuclear cataract, bilateral: Secondary | ICD-10-CM | POA: Diagnosis not present

## 2022-01-11 DIAGNOSIS — Z5181 Encounter for therapeutic drug level monitoring: Secondary | ICD-10-CM | POA: Diagnosis not present

## 2022-01-11 DIAGNOSIS — M6281 Muscle weakness (generalized): Secondary | ICD-10-CM | POA: Diagnosis not present

## 2022-01-11 DIAGNOSIS — M25511 Pain in right shoulder: Secondary | ICD-10-CM | POA: Diagnosis not present

## 2022-01-11 DIAGNOSIS — M5412 Radiculopathy, cervical region: Secondary | ICD-10-CM | POA: Diagnosis not present

## 2022-01-11 DIAGNOSIS — F419 Anxiety disorder, unspecified: Secondary | ICD-10-CM | POA: Diagnosis not present

## 2022-01-11 DIAGNOSIS — G894 Chronic pain syndrome: Secondary | ICD-10-CM | POA: Diagnosis not present

## 2022-01-11 DIAGNOSIS — Z79899 Other long term (current) drug therapy: Secondary | ICD-10-CM | POA: Diagnosis not present

## 2022-01-12 DIAGNOSIS — M6281 Muscle weakness (generalized): Secondary | ICD-10-CM | POA: Diagnosis not present

## 2022-03-19 DIAGNOSIS — M25531 Pain in right wrist: Secondary | ICD-10-CM | POA: Diagnosis not present

## 2022-03-19 DIAGNOSIS — M19031 Primary osteoarthritis, right wrist: Secondary | ICD-10-CM | POA: Diagnosis not present

## 2022-03-31 ENCOUNTER — Other Ambulatory Visit: Payer: Self-pay | Admitting: Family Medicine

## 2022-03-31 DIAGNOSIS — E785 Hyperlipidemia, unspecified: Secondary | ICD-10-CM

## 2022-03-31 DIAGNOSIS — I10 Essential (primary) hypertension: Secondary | ICD-10-CM

## 2022-04-08 ENCOUNTER — Telehealth: Payer: Self-pay | Admitting: *Deleted

## 2022-04-08 NOTE — Telephone Encounter (Signed)
Patient calling says he has cough and congestion for about a week. He is not having any difficulty breathing or shortness of breath. He says he was seen in urgent care a few months back and they gave him an inhaler and prednisone.  He would like to get a refill on this medication.  He also stated he is unable to come to the office because of the diarrhea he is having.    Advised pt spoke with Barbee Shropshire, NP, that she does not feel comfortable sending the medication in. He scheduled a telephone visit with Hoyle Sauer NP for tomorrow afternoon.

## 2022-04-09 ENCOUNTER — Encounter: Payer: Self-pay | Admitting: Nurse Practitioner

## 2022-04-09 ENCOUNTER — Ambulatory Visit (INDEPENDENT_AMBULATORY_CARE_PROVIDER_SITE_OTHER): Payer: Medicare Other | Admitting: Nurse Practitioner

## 2022-04-09 DIAGNOSIS — R062 Wheezing: Secondary | ICD-10-CM | POA: Insufficient documentation

## 2022-04-09 DIAGNOSIS — J208 Acute bronchitis due to other specified organisms: Secondary | ICD-10-CM

## 2022-04-09 DIAGNOSIS — B9689 Other specified bacterial agents as the cause of diseases classified elsewhere: Secondary | ICD-10-CM | POA: Diagnosis not present

## 2022-04-09 MED ORDER — ALBUTEROL SULFATE HFA 108 (90 BASE) MCG/ACT IN AERS
2.0000 | INHALATION_SPRAY | RESPIRATORY_TRACT | 0 refills | Status: DC | PRN
Start: 2022-04-09 — End: 2023-01-07

## 2022-04-09 MED ORDER — PROMETHAZINE-DM 6.25-15 MG/5ML PO SYRP: 5.0000 mL | ORAL_SOLUTION | Freq: Four times a day (QID) | ORAL | 0 refills | Status: DC | PRN

## 2022-04-09 MED ORDER — DOXYCYCLINE HYCLATE 100 MG PO CAPS: 100.0000 mg | ORAL_CAPSULE | Freq: Two times a day (BID) | ORAL | 0 refills | Status: DC

## 2022-04-09 MED ORDER — PREDNISONE 20 MG PO TABS: ORAL_TABLET | ORAL | 0 refills | Status: DC

## 2022-04-09 NOTE — Progress Notes (Deleted)
   Subjective:    Patient ID: Michael Barr, male    DOB: 02-Aug-1946, 76 y.o.   MRN: [id number]  HPI Chest congestion - x  1 week started out w/ sore throat, cough, fatigue has been using albuterol inhaler and robitussin, trouble laying down at night with cough/ congestion  No fevers or covid testing  Diarrhea  x 1 week - imodium and pepto multi symptom   Review of Systems     Objective:   Physical Exam        Assessment & Plan:

## 2022-04-09 NOTE — Progress Notes (Signed)
Patient ID: Michael Barr, male   DOB: May 01, 1946, 76 y.o.   MRN: [id number] Virtual Visit via Telephone Note  I connected with Michael Barr on 04/09/22 at  4:00 PM EDT by telephone and verified that I am speaking with the correct person using two identifiers.  Location: Patient: home Provider: office   I discussed the limitations, risks, security and privacy concerns of performing an evaluation and management service by telephone and the availability of in person appointments. I also discussed with the patient that there may be Barr patient responsible charge related to this service. The patient expressed understanding and agreed to proceed.   History of Present Illness: Presents by phone for complaints of cough and congestion over the past week.  Slightly better today but still has frequent spells of coughing with difficulty producing sputum.  Sputum is slightly yellowish at times.  Has been wheezing.  Started off using his albuterol inhaler about every 2 hours, now using it every 3-4 hours.  Sinus headache has improved.  No ear pain or sore throat.  No fever.  No chest pain.  Sugars have been stable.  Taking fluids well.  Had frequent diarrhea for several days, this has stopped over the past 24 hours.  Has eaten 2 meals with no further diarrhea.  Note that patient was seen for pneumonia on 03/24/2021 and acute bronchitis on 12/15/2021.  Non-smoker.   Observations/Objective: Today's visit was via telephone Physical exam was not possible for this visit Alert, oriented.  Speech clear.  Very mild shortness of breath noted with talking.  Assessment and Plan: Problem List Items Addressed This Visit       Other   Wheezing   Other Visit Diagnoses     Acute bacterial bronchitis    -  Primary      Meds ordered this encounter  Medications   predniSONE (DELTASONE) 20 MG tablet    Sig: 3 po qd x 3 d then 2 po qd x 3 d then 1 po qd x 2 d    Dispense:  17 tablet    Refill:  0    Order  Specific Question:   Supervising Provider    Answer:   Michael Barr [[id number]]   doxycycline (VIBRAMYCIN) 100 MG capsule    Sig: Take 1 capsule (100 mg total) by mouth 2 (two) times daily.    Dispense:  20 capsule    Refill:  0    Order Specific Question:   Supervising Provider    Answer:   Michael Barr [[id number]]   promethazine-dextromethorphan (PROMETHAZINE-DM) 6.25-15 MG/5ML syrup    Sig: Take 5 mLs by mouth 4 (four) times daily as needed for cough.    Dispense:  118 mL    Refill:  0    Order Specific Question:   Supervising Provider    Answer:   Michael Barr [[id number]]   albuterol (VENTOLIN HFA) 108 (90 Base) MCG/ACT inhaler    Sig: Inhale 2 puffs into the lungs every 4 (four) hours as needed for wheezing or shortness of breath.    Dispense:  18 g    Refill:  0    Order Specific Question:   Supervising Provider    Answer:   Michael Barr [[id number]]     Follow Up Instructions: Antibiotic and prednisone taper prescribed.  Refill albuterol inhaler.  Advised patient that if he continues to have issues with coughing and wheezing that we need to discuss Barr preventive inhaler. Reviewed warning  signs.  Call back next week if no improvement, go to urgent care or ED over the weekend if worse.   I discussed the assessment and treatment plan with the patient. The patient was provided an opportunity to ask questions and all were answered. The patient agreed with the plan and demonstrated an understanding of the instructions.   The patient was advised to call back or seek an in-person evaluation if the symptoms worsen or if the condition fails to improve as anticipated.  I provided 15 minutes of non-face-to-face time during this encounter.   Nilda Simmer, NP

## 2022-04-12 DIAGNOSIS — F419 Anxiety disorder, unspecified: Secondary | ICD-10-CM | POA: Diagnosis not present

## 2022-04-12 DIAGNOSIS — M25511 Pain in right shoulder: Secondary | ICD-10-CM | POA: Diagnosis not present

## 2022-04-12 DIAGNOSIS — Z79899 Other long term (current) drug therapy: Secondary | ICD-10-CM | POA: Diagnosis not present

## 2022-04-12 DIAGNOSIS — G894 Chronic pain syndrome: Secondary | ICD-10-CM | POA: Diagnosis not present

## 2022-04-12 DIAGNOSIS — M5412 Radiculopathy, cervical region: Secondary | ICD-10-CM | POA: Diagnosis not present

## 2022-04-16 DIAGNOSIS — M19031 Primary osteoarthritis, right wrist: Secondary | ICD-10-CM | POA: Diagnosis not present

## 2022-04-16 DIAGNOSIS — M25531 Pain in right wrist: Secondary | ICD-10-CM | POA: Diagnosis not present

## 2022-04-29 ENCOUNTER — Ambulatory Visit (INDEPENDENT_AMBULATORY_CARE_PROVIDER_SITE_OTHER): Payer: Medicare Other | Admitting: Family Medicine

## 2022-04-29 VITALS — BP 135/70 | HR 74 | Temp 98.1°F | Wt 305.0 lb

## 2022-04-29 DIAGNOSIS — E119 Type 2 diabetes mellitus without complications: Secondary | ICD-10-CM

## 2022-04-29 DIAGNOSIS — I10 Essential (primary) hypertension: Secondary | ICD-10-CM | POA: Diagnosis not present

## 2022-04-29 DIAGNOSIS — R531 Weakness: Secondary | ICD-10-CM

## 2022-04-29 DIAGNOSIS — I2510 Atherosclerotic heart disease of native coronary artery without angina pectoris: Secondary | ICD-10-CM | POA: Diagnosis not present

## 2022-04-29 DIAGNOSIS — Z130 Encounter for screening for diseases of the blood and blood-forming organs and certain disorders involving the immune mechanism: Secondary | ICD-10-CM

## 2022-04-29 DIAGNOSIS — R259 Unspecified abnormal involuntary movements: Secondary | ICD-10-CM

## 2022-04-29 MED ORDER — METFORMIN HCL 500 MG PO TABS: 500.0000 mg | ORAL_TABLET | Freq: Two times a day (BID) | ORAL | 3 refills | Status: DC

## 2022-04-29 NOTE — Progress Notes (Signed)
Subjective:  Patient ID: Michael Barr, male    DOB: 07-02-1946  Age: 76 y.o. MRN: [id number]  CC: Chief Complaint  Patient presents with   jerking for about a month   swelling in ankles   Medication Refill    Metformin    HPI:  76 year old male with hypertension, coronary disease, type 2 diabetes, chronic pain syndrome and fibromyalgia presents with the above complaints.  Patient reports a 1.24-monthhistory of "jerking" movements.  He states that it occurs typically when he is at rest.  Does not seem to occur with activity.  He states that he has sudden jerky movements.  He states that it affects a variety of different areas from his hands to arms to lower extremities.  No reports of resting tremor.  Patient also reports that he feels generally weak.  He recently had a fall and had difficulty getting up.  He states that his weakness is primarily of the lower extremities.  Had a recent increase in gabapentin.  No other medication changes.  Patient's BP is stable on lisinopril/HCTZ.  Needs refill on metformin.  Needs A1c as well.  Patient Active Problem List   Diagnosis Date Noted   Involuntary movements 04/29/2022   Fibromyalgia 01/06/2022   Type 2 diabetes mellitus without complications ([place] 009/32/6712  History of lumbar surgery 04/15/2020   Chronic pain syndrome 04/15/2020   Coronary artery disease 05/02/2017   Essential hypertension 05/02/2017   Hyperlipidemia LDL goal <70 05/02/2017    Social Hx   Social History   Socioeconomic History   Marital status: Married    Spouse name: Not on file   Number of children: Not on file   Years of education: Not on file   Highest education level: Not on file  Occupational History   Not on file  Tobacco Use   Smoking status: Former   Smokeless tobacco: Never   Tobacco comments:    quit smoking early 70's  Vaping Use   Vaping Use: Never used  Substance and Sexual Activity   Alcohol use: No   Drug use: No   Sexual  activity: Not Currently  Other Topics Concern   Not on file  Social History Narrative   Not on file   Social Determinants of Health   Financial Resource Strain: Not on file  Food Insecurity: Not on file  Transportation Needs: Not on file  Physical Activity: Not on file  Stress: Not on file  Social Connections: Not on file    Review of Systems Per HPI  Objective:  BP 135/70   Pulse 74   Temp 98.1 F (36.7 C) (Oral)   Wt (!) 305 lb (138.3 kg)   SpO2 95%   BMI 45.04 kg/m      04/29/2022   11:08 AM 01/05/2022    1:14 PM 12/15/2021   12:03 PM  BP/Weight  Systolic BP [id number] Diastolic BP 70 78 69  Wt. (Lbs) 305 302   BMI 45.04 kg/m2 44.6 kg/m2     Physical Exam Constitutional:      General: He is not in acute distress.    Appearance: Normal appearance. He is obese.  HENT:     Head: Normocephalic and atraumatic.  Eyes:     General:        Right eye: No discharge.        Left eye: No discharge.     Conjunctiva/sclera: Conjunctivae normal.  Cardiovascular:     Rate and  Rhythm: Normal rate and regular rhythm.  Pulmonary:     Effort: Pulmonary effort is normal.     Breath sounds: Normal breath sounds. No wheezing, rhonchi or rales.  Neurological:     Mental Status: He is alert.     Comments: Muscle strength of the upper extremities 4/5.  Decreased reflexes. No appreciable tremor.  Psychiatric:        Mood and Affect: Mood normal.        Behavior: Behavior normal.     Lab Results  Component Value Date   WBC 8.2 01/05/2022   HGB 13.6 01/05/2022   HCT 40.6 01/05/2022   PLT 238 01/05/2022   GLUCOSE 127 (H) 01/05/2022   CHOL 139 01/05/2022   TRIG 184 (H) 01/05/2022   HDL 39 (L) 01/05/2022   LDLCALC 69 01/05/2022   ALT 22 01/05/2022   AST 27 01/05/2022   NA 141 01/05/2022   K 4.7 01/05/2022   CL 102 01/05/2022   CREATININE 1.24 01/05/2022   BUN 20 01/05/2022   CO2 22 01/05/2022   INR 0.98 09/27/2010   HGBA1C 7.8 (H) 01/05/2022     Assessment  & Plan:   Problem List Items Addressed This Visit       Cardiovascular and Mediastinum   Essential hypertension    BP stable.  Continue lisinopril/HCTZ.        Endocrine   Type 2 diabetes mellitus without complications (HCC)    Metformin refilled.  Awaiting A1c.      Relevant Medications   metFORMIN (GLUCOPHAGE) 500 MG tablet   Other Relevant Orders   CMP14+EGFR   Hemoglobin A1c     Other   Involuntary movements - Primary    Etiology and prognosis unclear at this time.  Arranging for laboratory studies, MRI brain for further evaluation.  Referring to neurology Dr. Carles Collet who specializes in movement disorders.      Relevant Orders   MR Brain W Wo Contrast   Ambulatory referral to Neurology   Other Visit Diagnoses     Screening for deficiency anemia       Generalized weakness       Relevant Orders   CK   MR Brain W Wo Contrast      Follow-up: Pending work up  [place]

## 2022-04-29 NOTE — Assessment & Plan Note (Addendum)
Metformin refilled.  Awaiting A1c.

## 2022-04-29 NOTE — Patient Instructions (Signed)
Labs today.  We will work on MRI.  Referral placed.  Take care  Dr. Lacinda Axon

## 2022-04-29 NOTE — Assessment & Plan Note (Signed)
BP stable.  Continue lisinopril/HCTZ.

## 2022-04-29 NOTE — Assessment & Plan Note (Signed)
Etiology and prognosis unclear at this time.  Arranging for laboratory studies, MRI brain for further evaluation.  Referring to neurology Dr. Carles Collet who specializes in movement disorders.

## 2022-04-30 ENCOUNTER — Ambulatory Visit (HOSPITAL_BASED_OUTPATIENT_CLINIC_OR_DEPARTMENT_OTHER): Payer: Medicare Other

## 2022-04-30 ENCOUNTER — Other Ambulatory Visit: Payer: Self-pay

## 2022-04-30 DIAGNOSIS — R7989 Other specified abnormal findings of blood chemistry: Secondary | ICD-10-CM

## 2022-04-30 LAB — CMP14+EGFR
ALT: 20 IU/L (ref 0–44)
AST: 23 IU/L (ref 0–40)
Albumin/Globulin Ratio: 2.6 — ABNORMAL HIGH (ref 1.2–2.2)
Albumin: 4.5 g/dL (ref 3.7–4.7)
Alkaline Phosphatase: 45 IU/L (ref 44–121)
BUN/Creatinine Ratio: 16 (ref 10–24)
BUN: 23 mg/dL (ref 8–27)
Bilirubin Total: 0.3 mg/dL (ref 0.0–1.2)
CO2: 23 mmol/L (ref 20–29)
Calcium: 9.6 mg/dL (ref 8.6–10.2)
Chloride: 101 mmol/L (ref 96–106)
Creatinine, Ser: 1.47 mg/dL — ABNORMAL HIGH (ref 0.76–1.27)
Globulin, Total: 1.7 g/dL (ref 1.5–4.5)
Glucose: 117 mg/dL — ABNORMAL HIGH (ref 70–99)
Potassium: 4.8 mmol/L (ref 3.5–5.2)
Sodium: 141 mmol/L (ref 134–144)
Total Protein: 6.2 g/dL (ref 6.0–8.5)
eGFR: 50 mL/min/{1.73_m2} — ABNORMAL LOW (ref >59)

## 2022-04-30 LAB — HEMOGLOBIN A1C
Est. average glucose Bld gHb Est-mCnc: 146 mg/dL
Hgb A1c MFr Bld: 6.7 % — ABNORMAL HIGH (ref 4.8–5.6)

## 2022-04-30 LAB — CK: Total CK: 161 U/L (ref 41–331)

## 2022-05-03 ENCOUNTER — Other Ambulatory Visit: Payer: Self-pay

## 2022-05-03 ENCOUNTER — Encounter: Payer: Self-pay | Admitting: Neurology

## 2022-05-03 MED ORDER — NITROGLYCERIN 0.4 MG SL SUBL: 0.4000 mg | SUBLINGUAL_TABLET | SUBLINGUAL | 3 refills | Status: DC | PRN

## 2022-05-03 NOTE — Telephone Encounter (Signed)
Patient calls requesting a prescription for nitroglycerin subl tabs, he states not having any trouble or pain, just to have for in case needed or to use prior to going outdoors to do yard work. Please advise

## 2022-05-06 ENCOUNTER — Ambulatory Visit (HOSPITAL_BASED_OUTPATIENT_CLINIC_OR_DEPARTMENT_OTHER)
Admission: RE | Admit: 2022-05-06 | Discharge: 2022-05-06 | Disposition: A | Discharge status: Home / Self Care | Payer: Medicare Other | Source: Ambulatory Visit | Attending: Family Medicine | Admitting: Family Medicine

## 2022-05-06 DIAGNOSIS — I6782 Cerebral ischemia: Secondary | ICD-10-CM | POA: Insufficient documentation

## 2022-05-06 DIAGNOSIS — R531 Weakness: Secondary | ICD-10-CM | POA: Diagnosis not present

## 2022-05-06 DIAGNOSIS — G20 Parkinson's disease: Secondary | ICD-10-CM | POA: Diagnosis not present

## 2022-05-06 MED ORDER — GADOBUTROL 1 MMOL/ML IV SOLN
10.0000 mL | Freq: Once | INTRAVENOUS | Status: AC | PRN
  Administered 2022-05-06: 10 mL via INTRAVENOUS
  Filled 2022-05-06: qty 10

## 2022-05-10 ENCOUNTER — Other Ambulatory Visit: Payer: Self-pay | Admitting: *Deleted

## 2022-05-10 DIAGNOSIS — E785 Hyperlipidemia, unspecified: Secondary | ICD-10-CM

## 2022-05-10 MED ORDER — SIMVASTATIN 40 MG PO TABS: ORAL_TABLET | ORAL | 0 refills | Status: DC

## 2022-06-02 NOTE — Progress Notes (Deleted)
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  Gabapentin is a common source of myoclonus, especially in the face of chronic renal insufficiency.  Patient's gabapentin was recently increased around the time that the patient started to experience the myoclonus.  Myoclonus from gabapentin is generally dose dependent and I would recommend decreasing the dosage and seeing if the symptoms resolve.  If not, the gabapentin may need to be discontinued altogether.  I will leave this to the prescribing physician. Subjective:   Michael Barr was seen today in neurologic consultation at the request of Coral Spikes, DO.  The consultation is for the evaluation of involuntary movements.  Medical records made available to me are reviewed.  Patient is a 76 year old male with a history of hypertension, coronary artery disease, chronic pain syndrome/fibromyalgia (has seen [facility] neurology several years ago for this) diabetes mellitus type 2 who presents today for the evaluation of abnormal involuntary movements that the patient describes as "jerking."  This has been going on for about 2 months.  It can occur anywhere in the body, but it occurs independent of one another.  He reports that he has had 1 fall.  He reports generalized weakness, but nothing focal or lateralizing.  His only medication change has been an increase in his gabapentin.  The patient is a 76 y.o. year old male with a history of ***.  The first symptom began *** years ago.   MRI brain was completed on May 06, 2022.  This was nonacute.  There was mild small vessel disease and chronic cerebellar infarcts.  PREVIOUS MEDICATIONS: ***  ALLERGIES:   Allergies  Allergen Reactions   Penicillins Rash and Other (See Comments)    Has patient had a PCN reaction causing immediate rash, facial/tongue/throat swelling, SOB or lightheadedness with hypotension: UNSPECIFIED   Has patient had a PCN reaction causing severe rash involving mucus membranes or skin necrosis: UNSPECIFIED  Has patient had a PCN reaction that required hospitalization UNSPECIFIED  Has patient had a PCN reaction occurring within the last 10 years: UNSPECIFIED  If all of the above answers are "NO", then may proceed with Cephalosporin use.    CURRENT MEDICATIONS:  Outpatient Encounter Medications as of 06/15/2022  Medication Sig   acetaminophen (TYLENOL) 500 MG tablet Take 500 mg by mouth 3 (three) times daily.   albuterol (VENTOLIN HFA) 108 (90 Base) MCG/ACT inhaler Inhale 2 puffs into the lungs every 4 (four) hours as needed for wheezing or shortness of breath.   ALPRAZolam (XANAX) 0.25 MG tablet Take 0.25 mg by mouth 3 (three) times daily.   aspirin EC 325 MG tablet Take 325 mg by mouth daily.   diphenhydrAMINE (BENADRYL) 25 mg capsule Take 25 mg by mouth at bedtime as needed for itching or allergies.   doxycycline (VIBRAMYCIN) 100 MG capsule Take 1 capsule (100 mg total) by mouth 2 (two) times daily.   gabapentin (NEURONTIN) 300 MG capsule Take 300 mg by mouth See admin instructions. Take 1 capsule (300 mg) by mouth up to 3 times daily (typically takes 2 times daily)   lisinopril-hydrochlorothiazide (ZESTORETIC) 20-25 MG tablet TAKE 1 TABLET BY MOUTH EVERY DAY IN THE MORNING   metFORMIN (GLUCOPHAGE) 500 MG tablet Take 1 tablet (500 mg total) by mouth 2 (two) times daily with a meal.   methadone (DOLOPHINE) 5 MG tablet Take 5 mg by mouth 3 (three) times daily.   morphine (MS CONTIN) 15  MG 12 hr tablet Take 15 mg by mouth 3 (three) times daily.   Multiple Vitamins-Minerals (MULTIVITAMIN WITH MINERALS) tablet Take 1 tablet by mouth daily.   nitroGLYCERIN (NITROSTAT) 0.4 MG SL tablet Place 1 tablet (0.4 mg total) under the tongue every 5 (five) minutes as needed for chest pain.   Oxycodone HCl 10 MG TABS Take 10 mg by mouth 3 (three) times daily.   Potassium 99 MG TABS Take 99 mg by mouth  daily as needed (fatigue).    Propylhexedrine (BENZEDREX NA) Place 1 Inhaler into the nose as needed (congestion.).   senna (SENOKOT) 8.6 MG TABS tablet Take 1-2 tablets by mouth daily as needed (constipation.).    simvastatin (ZOCOR) 40 MG tablet Take 1 tablet by mouth at bedtime   traZODone (DESYREL) 100 MG tablet Take 250 mg by mouth at bedtime.    No facility-administered encounter medications on file as of 06/15/2022.    Objective:   PHYSICAL EXAMINATION:    VITALS:  There were no vitals filed for this visit.  GEN:  Normal appears male in no acute distress.  Appears stated age. HEENT:  Normocephalic, atraumatic. The mucous membranes are moist. The superficial temporal arteries are without ropiness or tenderness. Cardiovascular: Regular rate and rhythm. Lungs: Clear to auscultation bilaterally. Neck/Heme: There are no carotid bruits noted bilaterally.  NEUROLOGICAL: Orientation:  The patient is alert and oriented x 3.   Cranial nerves: There is good facial symmetry.  Extraocular muscles are intact and visual fields are full to confrontational testing. Speech is fluent and clear. Soft palate rises symmetrically and there is no tongue deviation. Hearing is intact to conversational tone. Tone: Tone is good throughout. Sensation: Sensation is intact to light touch and pinprick throughout (facial, trunk, extremities). Vibration is intact at the bilateral big toe. There is no extinction with double simultaneous stimulation. There is no sensory dermatomal level identified. Coordination:  The patient has no difficulty with RAM's or FNF bilaterally. Motor: Strength is 5/5 in the bilateral upper and lower extremities.  Shoulder shrug is equal and symmetric. There is no pronator drift.  There are no fasciculations noted. DTR's: Deep tendon reflexes are 2/4 at the bilateral biceps, triceps, brachioradialis, patella and achilles.  Plantar responses are downgoing bilaterally. Gait and Station: The  patient is able to ambulate without difficulty. The patient is able to heel toe walk without any difficulty. The patient is able to ambulate in a tandem fashion. The patient is able to stand in the Romberg position.  I have reviewed and interpreted the following labs independently   Chemistry      Component Value Date/Time   NA 141 04/29/2022 1217   K 4.8 04/29/2022 1217   CL 101 04/29/2022 1217   CO2 23 04/29/2022 1217   BUN 23 04/29/2022 1217   CREATININE 1.47 (H) 04/29/2022 1217      Component Value Date/Time   CALCIUM 9.6 04/29/2022 1217   ALKPHOS 45 04/29/2022 1217   AST 23 04/29/2022 1217   ALT 20 04/29/2022 1217   BILITOT 0.3 04/29/2022 1217       Total time spent on today's visit was ***greater than 60 minutes, including both face-to-face time and nonface-to-face time.  Time included that spent on review of records (prior notes available to me/labs/imaging if pertinent), discussing treatment and goals, answering patient's questions and coordinating care.   Cc:  Coral Spikes, DO

## 2022-06-11 DIAGNOSIS — M1711 Unilateral primary osteoarthritis, right knee: Secondary | ICD-10-CM | POA: Diagnosis not present

## 2022-06-11 DIAGNOSIS — M25562 Pain in left knee: Secondary | ICD-10-CM | POA: Diagnosis not present

## 2022-06-11 DIAGNOSIS — Z96652 Presence of left artificial knee joint: Secondary | ICD-10-CM | POA: Diagnosis not present

## 2022-06-11 DIAGNOSIS — M25561 Pain in right knee: Secondary | ICD-10-CM | POA: Diagnosis not present

## 2022-06-15 ENCOUNTER — Ambulatory Visit: Payer: Medicare Other | Admitting: Neurology

## 2022-06-17 DIAGNOSIS — D1801 Hemangioma of skin and subcutaneous tissue: Secondary | ICD-10-CM | POA: Diagnosis not present

## 2022-06-17 DIAGNOSIS — L821 Other seborrheic keratosis: Secondary | ICD-10-CM | POA: Diagnosis not present

## 2022-06-17 DIAGNOSIS — D485 Neoplasm of uncertain behavior of skin: Secondary | ICD-10-CM | POA: Diagnosis not present

## 2022-06-17 DIAGNOSIS — C44311 Basal cell carcinoma of skin of nose: Secondary | ICD-10-CM | POA: Diagnosis not present

## 2022-06-24 DIAGNOSIS — C44311 Basal cell carcinoma of skin of nose: Secondary | ICD-10-CM | POA: Diagnosis not present

## 2022-06-26 ENCOUNTER — Other Ambulatory Visit: Payer: Self-pay | Admitting: Family Medicine

## 2022-06-26 DIAGNOSIS — I10 Essential (primary) hypertension: Secondary | ICD-10-CM

## 2022-07-02 DIAGNOSIS — M19031 Primary osteoarthritis, right wrist: Secondary | ICD-10-CM | POA: Diagnosis not present

## 2022-07-09 ENCOUNTER — Ambulatory Visit (INDEPENDENT_AMBULATORY_CARE_PROVIDER_SITE_OTHER): Payer: Medicare Other | Admitting: Family Medicine

## 2022-07-09 VITALS — BP 122/60 | HR 74 | Temp 98.2°F | Ht 69.0 in | Wt 302.0 lb

## 2022-07-09 DIAGNOSIS — R259 Unspecified abnormal involuntary movements: Secondary | ICD-10-CM

## 2022-07-09 DIAGNOSIS — I2510 Atherosclerotic heart disease of native coronary artery without angina pectoris: Secondary | ICD-10-CM

## 2022-07-09 DIAGNOSIS — E119 Type 2 diabetes mellitus without complications: Secondary | ICD-10-CM | POA: Diagnosis not present

## 2022-07-09 DIAGNOSIS — E785 Hyperlipidemia, unspecified: Secondary | ICD-10-CM | POA: Diagnosis not present

## 2022-07-09 DIAGNOSIS — I10 Essential (primary) hypertension: Secondary | ICD-10-CM

## 2022-07-09 NOTE — Patient Instructions (Signed)
Consider diabetes medication that will help you lose weight.  Labs today.  Follow up in 6 months.

## 2022-07-10 LAB — BASIC METABOLIC PANEL
BUN/Creatinine Ratio: 27 — ABNORMAL HIGH (ref 10–24)
BUN: 33 mg/dL — ABNORMAL HIGH (ref 8–27)
CO2: 22 mmol/L (ref 20–29)
Calcium: 9.3 mg/dL (ref 8.6–10.2)
Chloride: 101 mmol/L (ref 96–106)
Creatinine, Ser: 1.23 mg/dL (ref 0.76–1.27)
Glucose: 111 mg/dL — ABNORMAL HIGH (ref 70–99)
Potassium: 4.9 mmol/L (ref 3.5–5.2)
Sodium: 143 mmol/L (ref 134–144)
eGFR: 62 mL/min/{1.73_m2} (ref >59)

## 2022-07-10 LAB — MICROALBUMIN / CREATININE URINE RATIO
Creatinine, Urine: 122.4 mg/dL
Microalb/Creat Ratio: 7 mg/g creat (ref 0–29)
Microalbumin, Urine: 8.7 ug/mL

## 2022-07-10 NOTE — Assessment & Plan Note (Signed)
Patient reports that this has resolved. He cancelled appt with Neurology.

## 2022-07-10 NOTE — Assessment & Plan Note (Signed)
Well controlled. Continue Lisinopril/HCTZ.

## 2022-07-10 NOTE — Assessment & Plan Note (Signed)
At goal.  Continue Simvastatin.  

## 2022-07-10 NOTE — Assessment & Plan Note (Signed)
At goal. Continue metformin. Patient will consider addition of GLP-1.

## 2022-07-10 NOTE — Progress Notes (Signed)
Subjective:  Patient ID: Michael Barr, male    DOB: October 04, 1946  Age: 76 y.o. MRN: [id number]  CC: Chief Complaint  Patient presents with   type 2 diabetes    Weight concern   memory concerns    Short term memory , father had alzhimers   balance issues    Total of falls or near falls in 2 months= 4 usually when going up steps, misjudge steps   HPI:  75 year old male with chronic pain, CAD, HTN, DM-2, HLD presents for follow up.  Patient reports a recent fall while using weed-eater. Couldn't get up and had to have neighbor assist to get him back to his feet. Not hurt from the fall.  Diabetes is well controlled. Last A1C 6.7 (04/29/22). He is trying to lose weight. Taking Metformin 500 mg twice daily. We discussed additional of GLP1 to help weight loss. Patient will consider.   Hypertension is well controlled on Lisinopril/HCTZ.   Lipids well controlled. Last LDL 69 (01/05/22).  Patient Active Problem List   Diagnosis Date Noted   Involuntary movements 04/29/2022   Fibromyalgia 01/06/2022   Type 2 diabetes mellitus without complications ([place]) 49/70/2637   History of lumbar surgery 04/15/2020   Chronic pain syndrome 04/15/2020   Coronary artery disease 05/02/2017   Essential hypertension 05/02/2017   Hyperlipidemia LDL goal <70 05/02/2017    Social Hx   Social History   Socioeconomic History   Marital status: Married    Spouse name: Not on file   Number of children: Not on file   Years of education: Not on file   Highest education level: Not on file  Occupational History   Not on file  Tobacco Use   Smoking status: Former   Smokeless tobacco: Never   Tobacco comments:    quit smoking early 70's  Vaping Use   Vaping Use: Never used  Substance and Sexual Activity   Alcohol use: No   Drug use: No   Sexual activity: Not Currently  Other Topics Concern   Not on file  Social History Narrative   Not on file   Social Determinants of Health   Financial Resource  Strain: Not on file  Food Insecurity: Not on file  Transportation Needs: Not on file  Physical Activity: Not on file  Stress: Not on file  Social Connections: Not on file    Review of Systems Per HPI  Objective:  BP 122/60   Pulse 74   Temp 98.2 F (36.8 C)   Ht '5\' 9"'$  (1.753 m)   Wt (!) 302 lb (137 kg)   SpO2 96%   BMI 44.60 kg/m      07/09/2022    1:36 PM 04/29/2022   11:08 AM 01/05/2022    1:14 PM  BP/Weight  Systolic BP 858 850 277  Diastolic BP 60 70 78  Wt. (Lbs) 302 305 302  BMI 44.6 kg/m2 45.04 kg/m2 44.6 kg/m2    Physical Exam Constitutional:      General: He is not in acute distress.    Appearance: He is obese.  HENT:     Head: Normocephalic and atraumatic.  Eyes:     General:        Right eye: No discharge.        Left eye: No discharge.     Conjunctiva/sclera: Conjunctivae normal.  Cardiovascular:     Rate and Rhythm: Normal rate and regular rhythm.  Pulmonary:     Effort: Pulmonary effort  is normal.     Breath sounds: Normal breath sounds.  Neurological:     Mental Status: He is alert.  Psychiatric:        Mood and Affect: Mood normal.        Behavior: Behavior normal.     Lab Results  Component Value Date   WBC 8.2 01/05/2022   HGB 13.6 01/05/2022   HCT 40.6 01/05/2022   PLT 238 01/05/2022   GLUCOSE 111 (H) 07/09/2022   CHOL 139 01/05/2022   TRIG 184 (H) 01/05/2022   HDL 39 (L) 01/05/2022   LDLCALC 69 01/05/2022   ALT 20 04/29/2022   AST 23 04/29/2022   NA 143 07/09/2022   K 4.9 07/09/2022   CL 101 07/09/2022   CREATININE 1.23 07/09/2022   BUN 33 (H) 07/09/2022   CO2 22 07/09/2022   INR 0.98 09/27/2010   HGBA1C 6.7 (H) 04/29/2022     Assessment & Plan:   Problem List Items Addressed This Visit       Cardiovascular and Mediastinum   Essential hypertension    Well controlled. Continue Lisinopril/HCTZ.        Endocrine   Type 2 diabetes mellitus without complications ([place]) - Primary    At goal. Continue metformin.  Patient will consider addition of GLP-1.      Relevant Orders   Basic Metabolic Panel (Completed)   Microalbumin / creatinine urine ratio (Completed)     Other   Hyperlipidemia LDL goal <70    At goal. Continue Simvastatin.       Involuntary movements    Patient reports that this has resolved. He cancelled appt with Neurology.       Follow-up:  Return in about 6 months (around 01/07/2023).  [place]

## 2022-08-05 DIAGNOSIS — M1711 Unilateral primary osteoarthritis, right knee: Secondary | ICD-10-CM | POA: Diagnosis not present

## 2022-08-05 DIAGNOSIS — M25561 Pain in right knee: Secondary | ICD-10-CM | POA: Diagnosis not present

## 2022-08-11 DIAGNOSIS — G894 Chronic pain syndrome: Secondary | ICD-10-CM | POA: Diagnosis not present

## 2022-08-11 DIAGNOSIS — M25511 Pain in right shoulder: Secondary | ICD-10-CM | POA: Diagnosis not present

## 2022-08-11 DIAGNOSIS — M170 Bilateral primary osteoarthritis of knee: Secondary | ICD-10-CM | POA: Diagnosis not present

## 2022-08-11 DIAGNOSIS — M797 Fibromyalgia: Secondary | ICD-10-CM | POA: Diagnosis not present

## 2022-08-11 DIAGNOSIS — Z79899 Other long term (current) drug therapy: Secondary | ICD-10-CM | POA: Diagnosis not present

## 2022-08-11 DIAGNOSIS — F419 Anxiety disorder, unspecified: Secondary | ICD-10-CM | POA: Diagnosis not present

## 2022-08-11 DIAGNOSIS — M5412 Radiculopathy, cervical region: Secondary | ICD-10-CM | POA: Diagnosis not present

## 2022-08-13 DIAGNOSIS — M1711 Unilateral primary osteoarthritis, right knee: Secondary | ICD-10-CM | POA: Diagnosis not present

## 2022-08-13 DIAGNOSIS — M25561 Pain in right knee: Secondary | ICD-10-CM | POA: Diagnosis not present

## 2022-08-13 DIAGNOSIS — Z6841 Body Mass Index (BMI) 40.0 and over, adult: Secondary | ICD-10-CM | POA: Diagnosis not present

## 2022-08-16 ENCOUNTER — Telehealth: Payer: Self-pay | Admitting: Family Medicine

## 2022-08-16 ENCOUNTER — Other Ambulatory Visit: Payer: Self-pay

## 2022-08-16 DIAGNOSIS — E785 Hyperlipidemia, unspecified: Secondary | ICD-10-CM

## 2022-08-16 MED ORDER — SIMVASTATIN 40 MG PO TABS: ORAL_TABLET | ORAL | 0 refills | Status: DC

## 2022-08-16 NOTE — Telephone Encounter (Signed)
Patient is requesting refill on simvastatin 40 mg sent to [facility]

## 2022-08-23 ENCOUNTER — Ambulatory Visit (INDEPENDENT_AMBULATORY_CARE_PROVIDER_SITE_OTHER): Payer: Medicare Other

## 2022-08-23 VITALS — Wt 302.0 lb

## 2022-08-23 DIAGNOSIS — Z0000 Encounter for general adult medical examination without abnormal findings: Secondary | ICD-10-CM

## 2022-08-23 NOTE — Patient Instructions (Signed)
Michael Barr , Thank you for taking time to come for your Medicare Wellness Visit. I appreciate your ongoing commitment to your health goals. Please review the following plan we discussed and let me know if I can assist you in the future.   Screening recommendations/referrals: Colonoscopy: 07/09/19 Recommended yearly ophthalmology/optometry visit for glaucoma screening and checkup Recommended yearly dental visit for hygiene and checkup  Vaccinations: Influenza vaccine: n/d Pneumococcal vaccine: 11/06/17 Tdap vaccine: n/d Shingles vaccine: n/d   Covid-19: n/d  Advanced directives: no  Conditions/risks identified: none  Next appointment: Follow up in one year for your annual wellness visit. 09/06/23 @ 1pm by phone  Preventive Care 65 Years and Older, Male Preventive care refers to lifestyle choices and visits with your health care provider that can promote health and wellness. What does preventive care include? A yearly physical exam. This is also called an annual well check. Dental exams once or twice a year. Routine eye exams. Ask your health care provider how often you should have your eyes checked. Personal lifestyle choices, including: Daily care of your teeth and gums. Regular physical activity. Eating a healthy diet. Avoiding tobacco and drug use. Limiting alcohol use. Practicing safe sex. Taking low doses of aspirin every day. Taking vitamin and mineral supplements as recommended by your health care provider. What happens during an annual well check? The services and screenings done by your health care provider during your annual well check will depend on your age, overall health, lifestyle risk factors, and family history of disease. Counseling  Your health care provider may ask you questions about your: Alcohol use. Tobacco use. Drug use. Emotional well-being. Home and relationship well-being. Sexual activity. Eating habits. History of falls. Memory and ability to  understand (cognition). Work and work Statistician. Screening  You may have the following tests or measurements: Height, weight, and BMI. Blood pressure. Lipid and cholesterol levels. These may be checked every 5 years, or more frequently if you are over 74 years old. Skin check. Lung cancer screening. You may have this screening every year starting at age 74 if you have a 30-pack-year history of smoking and currently smoke or have quit within the past 15 years. Fecal occult blood test (FOBT) of the stool. You may have this test every year starting at age 74. Flexible sigmoidoscopy or colonoscopy. You may have a sigmoidoscopy every 5 years or a colonoscopy every 10 years starting at age 74. Prostate cancer screening. Recommendations will vary depending on your family history and other risks. Hepatitis C blood test. Hepatitis B blood test. Sexually transmitted disease (STD) testing. Diabetes screening. This is done by checking your blood sugar (glucose) after you have not eaten for a while (fasting). You may have this done every 1-3 years. Abdominal aortic aneurysm (AAA) screening. You may need this if you are a current or former smoker. Osteoporosis. You may be screened starting at age 74 if you are at high risk. Talk with your health care provider about your test results, treatment options, and if necessary, the need for more tests. Vaccines  Your health care provider may recommend certain vaccines, such as: Influenza vaccine. This is recommended every year. Tetanus, diphtheria, and acellular pertussis (Tdap, Td) vaccine. You may need a Td booster every 10 years. Zoster vaccine. You may need this after age 74. Pneumococcal 13-valent conjugate (PCV13) vaccine. One dose is recommended after age 74. Pneumococcal polysaccharide (PPSV23) vaccine. One dose is recommended after age 74. Talk to your health care provider about which  screenings and vaccines you need and how often you need them. This  information is not intended to replace advice given to you by your health care provider. Make sure you discuss any questions you have with your health care provider. Document Released: 11/07/2015 Document Revised: 06/30/2016 Document Reviewed: 08/12/2015 Elsevier Interactive Patient Education  2017 [place] Prevention in the Home Falls can cause injuries. They can happen to people of all ages. There are many things you can do to make your home safe and to help prevent falls. What can I do on the outside of my home? Regularly fix the edges of walkways and driveways and fix any cracks. Remove anything that might make you trip as you walk through a door, such as a raised step or threshold. Trim any bushes or trees on the path to your home. Use bright outdoor lighting. Clear any walking paths of anything that might make someone trip, such as rocks or tools. Regularly check to see if handrails are loose or broken. Make sure that both sides of any steps have handrails. Any raised decks and porches should have guardrails on the edges. Have any leaves, snow, or ice cleared regularly. Use sand or salt on walking paths during winter. Clean up any spills in your garage right away. This includes oil or grease spills. What can I do in the bathroom? Use night lights. Install grab bars by the toilet and in the tub and shower. Do not use towel bars as grab bars. Use non-skid mats or decals in the tub or shower. If you need to sit down in the shower, use a plastic, non-slip stool. Keep the floor dry. Clean up any water that spills on the floor as soon as it happens. Remove soap buildup in the tub or shower regularly. Attach bath mats securely with double-sided non-slip rug tape. Do not have throw rugs and other things on the floor that can make you trip. What can I do in the bedroom? Use night lights. Make sure that you have a light by your bed that is easy to reach. Do not use any sheets or  blankets that are too big for your bed. They should not hang down onto the floor. Have a firm chair that has side arms. You can use this for support while you get dressed. Do not have throw rugs and other things on the floor that can make you trip. What can I do in the kitchen? Clean up any spills right away. Avoid walking on wet floors. Keep items that you use a lot in easy-to-reach places. If you need to reach something above you, use a strong step stool that has a grab bar. Keep electrical cords out of the way. Do not use floor polish or wax that makes floors slippery. If you must use wax, use non-skid floor wax. Do not have throw rugs and other things on the floor that can make you trip. What can I do with my stairs? Do not leave any items on the stairs. Make sure that there are handrails on both sides of the stairs and use them. Fix handrails that are broken or loose. Make sure that handrails are as long as the stairways. Check any carpeting to make sure that it is firmly attached to the stairs. Fix any carpet that is loose or worn. Avoid having throw rugs at the top or bottom of the stairs. If you do have throw rugs, attach them to the floor with carpet  tape. Make sure that you have a light switch at the top of the stairs and the bottom of the stairs. If you do not have them, ask someone to add them for you. What else can I do to help prevent falls? Wear shoes that: Do not have high heels. Have rubber bottoms. Are comfortable and fit you well. Are closed at the toe. Do not wear sandals. If you use a stepladder: Make sure that it is fully opened. Do not climb a closed stepladder. Make sure that both sides of the stepladder are locked into place. Ask someone to hold it for you, if possible. Clearly mark and make sure that you can see: Any grab bars or handrails. First and last steps. Where the edge of each step is. Use tools that help you move around (mobility aids) if they are  needed. These include: Canes. Walkers. Scooters. Crutches. Turn on the lights when you go into a dark area. Replace any light bulbs as soon as they burn out. Set up your furniture so you have a clear path. Avoid moving your furniture around. If any of your floors are uneven, fix them. If there are any pets around you, be aware of where they are. Review your medicines with your doctor. Some medicines can make you feel dizzy. This can increase your chance of falling. Ask your doctor what other things that you can do to help prevent falls. This information is not intended to replace advice given to you by your health care provider. Make sure you discuss any questions you have with your health care provider. Document Released: 08/07/2009 Document Revised: 03/18/2016 Document Reviewed: 11/15/2014 Elsevier Interactive Patient Education  2017 [employer].

## 2022-08-23 NOTE — Progress Notes (Signed)
Virtual Visit via Telephone Note  I connected with  [name] on 08/23/22 at  1:45 PM EDT by telephone and verified that I am speaking with the correct person using two identifiers.  Location: Patient: home Provider: [facility] Persons participating in the virtual visit: patient/Nurse Health Advisor   I discussed the limitations, risks, security and privacy concerns of performing an evaluation and management service by telephone and the availability of in person appointments. The patient expressed understanding and agreed to proceed.  Interactive audio and video telecommunications were attempted between this nurse and patient, however failed, due to patient having technical difficulties OR patient did not have access to video capability.  We continued and completed visit with audio only.  Some vital signs may be absent or patient reported.   Dionisio David, LPN  Subjective:   Won Kreuzer is a 76 y.o. male who presents for Medicare Annual/Subsequent preventive examination.  Review of Systems     Cardiac Risk Factors include: advanced age (>29mn, >>50women);diabetes mellitus;male gender     Objective:    Today's Vitals   08/23/22 1354  PainSc: 6    There is no height or weight on file to calculate BMI.     08/23/2022    2:02 PM 07/09/2019   11:27 AM 07/05/2019    8:30 AM 08/14/2018    1:01 PM 01/28/2017    6:00 PM 01/20/2017    2:06 PM 03/23/2016    1:35 PM  Advanced Directives  Does Patient Have a Medical Advance Directive? No No No Yes Yes Yes Yes  Type of Advance Directive     Living will Living will [place] will  Does patient want to make changes to medical advance directive?     Yes (Inpatient - patient defers changing a medical advance directive at this time)    Copy of [place] Chart?       No - copy requested  Would patient like information on creating a medical advance directive? No - Patient declined No -  Patient declined No - Patient declined        Current Medications (verified) Outpatient Encounter Medications as of 08/23/2022  Medication Sig   acetaminophen (TYLENOL) 500 MG tablet Take 500 mg by mouth 3 (three) times daily.   albuterol (VENTOLIN HFA) 108 (90 Base) MCG/ACT inhaler Inhale 2 puffs into the lungs every 4 (four) hours as needed for wheezing or shortness of breath.   ALPRAZolam (XANAX) 0.25 MG tablet Take 0.25 mg by mouth 3 (three) times daily.   aspirin EC 325 MG tablet Take 325 mg by mouth daily.   diphenhydrAMINE (BENADRYL) 25 mg capsule Take 25 mg by mouth at bedtime as needed for itching or allergies.   gabapentin (NEURONTIN) 300 MG capsule Take 300 mg by mouth See admin instructions. Take 1 capsule (300 mg) by mouth up to 3 times daily (typically takes 2 times daily)   lisinopril-hydrochlorothiazide (ZESTORETIC) 20-25 MG tablet TAKE 1 TABLET BY MOUTH EVERY DAY IN THE MORNING   metFORMIN (GLUCOPHAGE) 500 MG tablet Take 1 tablet (500 mg total) by mouth 2 (two) times daily with a meal.   morphine (MS CONTIN) 15 MG 12 hr tablet Take 15 mg by mouth 3 (three) times daily.   Multiple Vitamins-Minerals (MULTIVITAMIN WITH MINERALS) tablet Take 1 tablet by mouth daily.   nitroGLYCERIN (NITROSTAT) 0.4 MG SL tablet Place 1 tablet (0.4 mg total) under the tongue every 5 (five) minutes as needed  for chest pain.   Oxycodone HCl 10 MG TABS Take 10 mg by mouth 3 (three) times daily.   Potassium 99 MG TABS Take 99 mg by mouth daily as needed (fatigue).    Propylhexedrine (BENZEDREX NA) Place 1 Inhaler into the nose as needed (congestion.).   senna (SENOKOT) 8.6 MG TABS tablet Take 1-2 tablets by mouth daily as needed (constipation.).    simvastatin (ZOCOR) 40 MG tablet Take 1 tablet by mouth at bedtime   traZODone (DESYREL) 100 MG tablet Take 250 mg by mouth at bedtime.    methadone (DOLOPHINE) 5 MG tablet Take 5 mg by mouth 3 (three) times daily. (Patient not taking: Reported on 08/23/2022)    No facility-administered encounter medications on file as of 08/23/2022.    Allergies (verified) Penicillins   History: Past Medical History:  Diagnosis Date   Anxiety    takes Xanax and Methadone daily   Arthritis    low back- DDD   Asthma 70's   Cataract    immature and to the left eye   Chronic back pain    HNP   Constipation    takes Sennokot nightly   Coronary artery disease    Depression    attacks   Fibromyalgia    GERD (gastroesophageal reflux disease)    20 YRS AGO, AND DIET CONTROL   History of kidney stones    Hyperlipidemia    takes Zocor daily   Hypertension    takes Zestoretic daily   Hypokalemia    takes Potassium daily   Insomnia    takes trazodone nightly   Joint pain    Joint swelling    Nocturia    Pneumonia 70's   Type 2 diabetes mellitus without complications ([place])    Weakness    right leg   Past Surgical History:  Procedure Laterality Date   APPLICATION OF INTRAOPERATIVE CT SCAN N/A 08/21/2018   Procedure: APPLICATION OF INTRAOPERATIVE CT SCAN;  Surgeon: Kary Kos, MD;  Location: [place];  Service: Neurosurgery;  Laterality: N/A;   BACK SURGERY     multiple back surgeries   CARDIAC CATHETERIZATION     unsure of how long ago   CARPAL TUNNEL RELEASE Bilateral [place] WITH PROPOFOL N/A 07/09/2019   Procedure: COLONOSCOPY WITH PROPOFOL;  Surgeon: Daneil Dolin, MD;  [facility];  Service: Endoscopy;  Laterality: N/A;  12:15pm   CORONARY ARTERY BYPASS GRAFT  1996   1 vessel   HARDWARE REMOVAL N/A 03/26/2016   Procedure: HARDWARE REMOVAL Lumbar one-two ;  Surgeon: Kary Kos, MD;  Location: [facility] [facility];  Service: Neurosurgery;  Laterality: N/A;   JOINT REPLACEMENT  1999   lt knee   KNEE ARTHROSCOPY Right    LUMBAR LAMINECTOMY/DECOMPRESSION MICRODISCECTOMY Right 09/26/2013   Procedure: LUMBAR LAMINECTOMY/DECOMPRESSION MICRODISCECTOMY RIGHT THREE-FOUR;  Surgeon: Elaina Hoops,  MD;  Location: [place] [facility];  Service: Neurosurgery;  Laterality: Right;   pilondial cyst     x 2    POLYPECTOMY  07/09/2019   Procedure: POLYPECTOMY;  Surgeon: Daneil Dolin, MD;  [facility];  Service: Endoscopy;;  transverse colon polyps x 2   POSTERIOR LUMBAR FUSION 4 LEVEL N/A 08/21/2018   Procedure: Posterior Lateral Fusion Thoracic Eight to Pelvis with Iliac screws and Fenestrated screws with kyphon and AIRO;  Surgeon: Kary Kos, MD;  Location: [place];  Service: Neurosurgery;  Laterality: N/A;  SHOULDER ARTHROSCOPY Bilateral    left x 2 and right x 1   TONSILLECTOMY     tumor removed from right ankle   70's   Family History  Problem Relation Age of Onset   Colon cancer Neg Hx    Social History   Socioeconomic History   Marital status: Married    Spouse name: Not on file   Number of children: Not on file   Years of education: Not on file   Highest education level: Not on file  Occupational History   Not on file  Tobacco Use   Smoking status: Former   Smokeless tobacco: Never   Tobacco comments:    quit smoking early 70's  Vaping Use   Vaping Use: Never used  Substance and Sexual Activity   Alcohol use: No   Drug use: No   Sexual activity: Not Currently  Other Topics Concern   Not on file  Social History Narrative   Not on file   Social Determinants of Health   Financial Resource Strain: Low Risk  (08/23/2022)   Overall Financial Resource Strain (CARDIA)    Difficulty of Paying Living Expenses: Not hard at all  Food Insecurity: No Food Insecurity (08/23/2022)   Hunger Vital Sign    Worried About Running Out of Food in the Last Year: Never true    Ran Out of Food in the Last Year: Never true  Transportation Needs: No Transportation Needs (08/23/2022)   PRAPARE - Hydrologist (Medical): No    Lack of Transportation (Non-Medical): No  Physical Activity: Inactive (08/23/2022)   Exercise Vital Sign    Days of Exercise  per Week: 0 days    Minutes of Exercise per Session: 0 min  Stress: No Stress Concern Present (08/23/2022)   [place]    Feeling of Stress : Only a little  Social Connections: Moderately Integrated (08/23/2022)   Social Connection and Isolation Panel [NHANES]    Frequency of Communication with Friends and Family: More than three times a week    Frequency of Social Gatherings with Friends and Family: Twice a week    Attends Religious Services: More than 4 times per year    Active Member of [employer] or Organizations: No    Attends Music therapist: Never    Marital Status: Married    Tobacco Counseling Counseling given: Not Answered Tobacco comments: quit smoking early 70's   Clinical Intake:  Pre-visit preparation completed: Yes  Pain : 0-10 Pain Score: 6  Pain Location: Knee Pain Orientation: Right Pain Descriptors / Indicators: Aching, Burning     Diabetes: Yes CBG done?: No Did pt. bring in CBG monitor from home?: No  How often do you need to have someone help you when you read instructions, pamphlets, or other written materials from your doctor or pharmacy?: 1 - Never  Diabetic?yes Nutrition Risk Assessment:  Has the patient had any N/V/D within the last 2 months?  Yes  Does the patient have any non-healing wounds?  No  Has the patient had any unintentional weight loss or weight gain?  No   Diabetes:  Is the patient diabetic?  Yes  If diabetic, was a CBG obtained today?  No  Did the patient bring in their glucometer from home?  No  How often do you monitor your CBG's? Every other day.   Financial Strains and Diabetes Management:  Are you having any  financial strains with the device, your supplies or your medication? No .  Does the patient want to be seen by Chronic Care Management for management of their diabetes?  No  Would the patient like to be referred to a Nutritionist or for  Diabetic Management?  Yes   Diabetic Exams:  Diabetic Eye Exam: Completed no. Overdue for diabetic eye exam. Pt has been advised about the importance in completing this exam.  Diabetic Foot Exam: Completed no. Pt has been advised about the importance in completing this exam.   Interpreter Needed?: No  Information entered by :: Kirke Shaggy, LPN   Activities of Daily Living    08/23/2022    2:05 PM 08/20/2022    4:46 PM  In your present state of health, do you have any difficulty performing the following activities:  Hearing? 0 0  Vision? 0 0  Difficulty concentrating or making decisions? 0   Walking or climbing stairs? 1 1  Dressing or bathing? 0 0  Doing errands, shopping? 0 0  Preparing Food and eating ? N N  Using the Toilet? N N  In the past six months, have you accidently leaked urine? N N  Do you have problems with loss of bowel control? N N  Managing your Medications? N N  Managing your Finances? N N  Housekeeping or managing your Housekeeping? N N    Patient Care Team: Coral Spikes, DO as PCP - General (Family Medicine) Gala Romney Cristopher Estimable, MD as Consulting Physician (Gastroenterology)  Indicate any recent Medical Services you may have received from other than [facility] providers in the past year (date may be approximate).     Assessment:   This is a routine wellness examination for [place].  Hearing/Vision screen Hearing Screening - Comments:: No aids Vision Screening - Comments:: Readers-    Dietary issues and exercise activities discussed: Current Exercise Habits: The patient does not participate in regular exercise at present   Goals Addressed             This Visit's Progress    DIET - EAT MORE FRUITS AND VEGETABLES         Depression Screen    08/23/2022    2:00 PM 01/05/2022    1:21 PM 04/17/2021    1:27 PM 10/15/2020    1:14 PM 11/02/2017    2:38 PM  PHQ 2/9 Scores  PHQ - 2 Score 0 0 0 0 0  PHQ- 9 Score 0        Fall Risk    08/23/2022     2:04 PM 08/20/2022    4:46 PM 07/09/2022    1:44 PM 01/05/2022    1:20 PM 04/17/2021    1:26 PM  Fall Risk   Falls in the past year? '1 1 1 '$ 0 0  Number falls in past yr: 0 0 1 0 0  Injury with Fall? 0 0 1 0 0  Risk for fall due to : History of fall(s)  No Fall Risks;History of fall(s) No Fall Risks No Fall Risks  Follow up Falls prevention discussed;Falls evaluation completed  Falls evaluation completed Falls evaluation completed Falls evaluation completed    FALL RISK PREVENTION PERTAINING TO THE HOME:  Any stairs in or around the home? Yes  If so, are there any without handrails? No  Home free of loose throw rugs in walkways, pet beds, electrical cords, etc? Yes  Adequate lighting in your home to reduce risk of falls? Yes  ASSISTIVE DEVICES UTILIZED TO PREVENT FALLS:  Life alert? No  Use of a cane, walker or w/c? Yes  Grab bars in the bathroom? Yes  Shower chair or bench in shower? No  Elevated toilet seat or a handicapped toilet? Yes    Cognitive Function:        08/23/2022    2:09 PM  6CIT Screen  What Year? 0 points  What month? 0 points  What time? 0 points  Count back from 20 0 points  Months in reverse 0 points  Repeat phrase 0 points  Total Score 0 points    Immunizations Immunization History  Administered Date(s) Administered   Influenza, High Dose Seasonal PF 08/16/2017, 10/01/2018   Influenza-Unspecified 09/25/2019   Pneumococcal Polysaccharide-23 10/23/2005, 07/25/2015   Pneumococcal-Unspecified 11/06/2017    TDAP status: Due, Education has been provided regarding the importance of this vaccine. Advised may receive this vaccine at local pharmacy or Health Dept. Aware to provide a copy of the vaccination record if obtained from local pharmacy or Health Dept. Verbalized acceptance and understanding.  Flu Vaccine status: Due, Education has been provided regarding the importance of this vaccine. Advised may receive this vaccine at local pharmacy or  Health Dept. Aware to provide a copy of the vaccination record if obtained from local pharmacy or Health Dept. Verbalized acceptance and understanding.  Pneumococcal vaccine status: Up to date  Covid-19 vaccine status: Completed vaccines  Qualifies for Shingles Vaccine? Yes   Zostavax completed No   Shingrix Completed?: No.    Education has been provided regarding the importance of this vaccine. Patient has been advised to call insurance company to determine out of pocket expense if they have not yet received this vaccine. Advised may also receive vaccine at local pharmacy or Health Dept. Verbalized acceptance and understanding.  Screening Tests Health Maintenance  Topic Date Due   FOOT EXAM  Never done   OPHTHALMOLOGY EXAM  Never done   Zoster Vaccines- Shingrix (1 of 2) Never done   INFLUENZA VACCINE  05/25/2022   TETANUS/TDAP  04/10/2023 (Originally 03/09/1965)   Hepatitis C Screening  04/10/2023 (Originally 03/08/1964)   HEMOGLOBIN A1C  10/30/2022   Diabetic kidney evaluation - GFR measurement  07/10/2023   Diabetic kidney evaluation - Urine ACR  07/10/2023   Medicare Annual Wellness (AWV)  08/24/2023   COLONOSCOPY (Pts 45-54yr Insurance coverage will need to be confirmed)  07/08/2029   HPV VACCINES  Aged Out   Pneumonia Vaccine 76 Years old  Discontinued   COVID-19 Vaccine  Discontinued    Health Maintenance  Health Maintenance Due  Topic Date Due   FOOT EXAM  Never done   OPHTHALMOLOGY EXAM  Never done   Zoster Vaccines- Shingrix (1 of 2) Never done   INFLUENZA VACCINE  05/25/2022    Colorectal cancer screening: Type of screening: Colonoscopy. Completed 07/09/19. Repeat every 5 years  Lung Cancer Screening: (Low Dose CT Chest recommended if Age 74-80years, 30 pack-year currently smoking OR have quit w/in 15years.) does not qualify.   Additional Screening:  Hepatitis C Screening: does qualify; Completed no  Vision Screening: Recommended annual ophthalmology exams  for early detection of glaucoma and other disorders of the eye. Is the patient up to date with their annual eye exam?  No  Who is the provider or what is the name of the office in which the patient attends annual eye exams? No one If pt is not established with a provider, would they like to be referred  to a provider to establish care? No .   Dental Screening: Recommended annual dental exams for proper oral hygiene  Community Resource Referral / Chronic Care Management: CRR required this visit?  No   CCM required this visit?  No      Plan:     I have personally reviewed and noted the following in the patient's chart:   Medical and social history Use of alcohol, tobacco or illicit drugs  Current medications and supplements including opioid prescriptions. Patient is currently taking opioid prescriptions. Information provided to patient regarding non-opioid alternatives. Patient advised to discuss non-opioid treatment plan with their provider. Functional ability and status Nutritional status Physical activity Advanced directives List of other physicians Hospitalizations, surgeries, and ER visits in previous 12 months Vitals Screenings to include cognitive, depression, and falls Referrals and appointments  In addition, I have reviewed and discussed with patient certain preventive protocols, quality metrics, and best practice recommendations. A written personalized care plan for preventive services as well as general preventive health recommendations were provided to patient.     Dionisio David, LPN   [id number]   Nurse Notes: none

## 2022-08-26 DIAGNOSIS — M1711 Unilateral primary osteoarthritis, right knee: Secondary | ICD-10-CM | POA: Diagnosis not present

## 2022-09-09 DIAGNOSIS — M25561 Pain in right knee: Secondary | ICD-10-CM | POA: Diagnosis not present

## 2022-09-10 DIAGNOSIS — Z23 Encounter for immunization: Secondary | ICD-10-CM | POA: Diagnosis not present

## 2022-09-23 ENCOUNTER — Other Ambulatory Visit: Payer: Self-pay | Admitting: Family Medicine

## 2022-09-23 DIAGNOSIS — I10 Essential (primary) hypertension: Secondary | ICD-10-CM

## 2022-10-05 DIAGNOSIS — D239 Other benign neoplasm of skin, unspecified: Secondary | ICD-10-CM | POA: Diagnosis not present

## 2022-10-05 DIAGNOSIS — Z85828 Personal history of other malignant neoplasm of skin: Secondary | ICD-10-CM | POA: Diagnosis not present

## 2022-10-05 DIAGNOSIS — L570 Actinic keratosis: Secondary | ICD-10-CM | POA: Diagnosis not present

## 2022-10-05 DIAGNOSIS — Z1283 Encounter for screening for malignant neoplasm of skin: Secondary | ICD-10-CM | POA: Diagnosis not present

## 2022-11-15 DIAGNOSIS — Z79899 Other long term (current) drug therapy: Secondary | ICD-10-CM | POA: Diagnosis not present

## 2022-11-15 DIAGNOSIS — M797 Fibromyalgia: Secondary | ICD-10-CM | POA: Diagnosis not present

## 2022-11-15 DIAGNOSIS — M5412 Radiculopathy, cervical region: Secondary | ICD-10-CM | POA: Diagnosis not present

## 2022-11-15 DIAGNOSIS — F419 Anxiety disorder, unspecified: Secondary | ICD-10-CM | POA: Diagnosis not present

## 2022-11-15 DIAGNOSIS — Z5181 Encounter for therapeutic drug level monitoring: Secondary | ICD-10-CM | POA: Diagnosis not present

## 2022-11-15 DIAGNOSIS — M25561 Pain in right knee: Secondary | ICD-10-CM | POA: Diagnosis not present

## 2022-11-15 DIAGNOSIS — G894 Chronic pain syndrome: Secondary | ICD-10-CM | POA: Diagnosis not present

## 2022-11-15 DIAGNOSIS — M25519 Pain in unspecified shoulder: Secondary | ICD-10-CM | POA: Diagnosis not present

## 2022-12-19 ENCOUNTER — Other Ambulatory Visit: Payer: Self-pay | Admitting: Family Medicine

## 2022-12-19 DIAGNOSIS — I10 Essential (primary) hypertension: Secondary | ICD-10-CM

## 2023-01-07 ENCOUNTER — Ambulatory Visit (INDEPENDENT_AMBULATORY_CARE_PROVIDER_SITE_OTHER): Payer: Medicare Other | Admitting: Family Medicine

## 2023-01-07 VITALS — BP 118/62 | HR 71 | Temp 98.2°F | Ht 69.0 in | Wt 309.0 lb

## 2023-01-07 DIAGNOSIS — E119 Type 2 diabetes mellitus without complications: Secondary | ICD-10-CM | POA: Diagnosis not present

## 2023-01-07 DIAGNOSIS — E785 Hyperlipidemia, unspecified: Secondary | ICD-10-CM | POA: Diagnosis not present

## 2023-01-07 DIAGNOSIS — I10 Essential (primary) hypertension: Secondary | ICD-10-CM | POA: Diagnosis not present

## 2023-01-07 DIAGNOSIS — R7989 Other specified abnormal findings of blood chemistry: Secondary | ICD-10-CM | POA: Diagnosis not present

## 2023-01-07 DIAGNOSIS — Z130 Encounter for screening for diseases of the blood and blood-forming organs and certain disorders involving the immune mechanism: Secondary | ICD-10-CM

## 2023-01-07 MED ORDER — ALBUTEROL SULFATE HFA 108 (90 BASE) MCG/ACT IN AERS
2.0000 | INHALATION_SPRAY | Freq: Four times a day (QID) | RESPIRATORY_TRACT | 1 refills | Status: AC | PRN
Start: 2023-01-07 — End: ?

## 2023-01-07 NOTE — Patient Instructions (Signed)
Labs today.  Continue your medications.  Follow up in 6 months.  Take care  Dr. Kenlea Woodell 

## 2023-01-08 LAB — CMP14+EGFR
ALT: 22 IU/L (ref 0–44)
AST: 23 IU/L (ref 0–40)
Albumin/Globulin Ratio: 1.9 (ref 1.2–2.2)
Albumin: 4.6 g/dL (ref 3.8–4.8)
Alkaline Phosphatase: 58 IU/L (ref 44–121)
BUN/Creatinine Ratio: 22 (ref 10–24)
BUN: 31 mg/dL — ABNORMAL HIGH (ref 8–27)
Bilirubin Total: 0.4 mg/dL (ref 0.0–1.2)
CO2: 21 mmol/L (ref 20–29)
Calcium: 9.7 mg/dL (ref 8.6–10.2)
Chloride: 104 mmol/L (ref 96–106)
Creatinine, Ser: 1.44 mg/dL — ABNORMAL HIGH (ref 0.76–1.27)
Globulin, Total: 2.4 g/dL (ref 1.5–4.5)
Glucose: 124 mg/dL — ABNORMAL HIGH (ref 70–99)
Potassium: 5.5 mmol/L — ABNORMAL HIGH (ref 3.5–5.2)
Sodium: 142 mmol/L (ref 134–144)
Total Protein: 7 g/dL (ref 6.0–8.5)
eGFR: 51 mL/min/{1.73_m2} — ABNORMAL LOW (ref >59)

## 2023-01-08 LAB — LIPID PANEL
Chol/HDL Ratio: 3.7 ratio (ref 0.0–5.0)
Cholesterol, Total: 138 mg/dL (ref 100–199)
HDL: 37 mg/dL — ABNORMAL LOW (ref >39)
LDL Chol Calc (NIH): 62 mg/dL (ref 0–99)
Triglycerides: 242 mg/dL — ABNORMAL HIGH (ref 0–149)
VLDL Cholesterol Cal: 39 mg/dL (ref 5–40)

## 2023-01-08 LAB — HEMOGLOBIN A1C
Est. average glucose Bld gHb Est-mCnc: 169 mg/dL
Hgb A1c MFr Bld: 7.5 % — ABNORMAL HIGH (ref 4.8–5.6)

## 2023-01-09 DIAGNOSIS — R7989 Other specified abnormal findings of blood chemistry: Secondary | ICD-10-CM | POA: Insufficient documentation

## 2023-01-09 NOTE — Progress Notes (Signed)
Subjective:  Patient ID: Michael Barr, male    DOB: August 04, 1946  Age: 77 y.o. MRN: [id number]  CC: Chief Complaint  Patient presents with   Diabetes    Has started changes in diet increasing veggies and low calorie foods , also needs have knee surgery    Hypertension    HPI:  77 year old male with the below mentioned medical problems presents for follow up.   Hypertension stable on lisinopril/HCTZ. Needs labs today.  DM-2 has been stable on Metformin 500 mg BID. Needs A1C today.   HLD has been stable on simvastatin. Needs Lipid panel today.  Reports recent worsening pain. Follows with pain management.   Patient Active Problem List   Diagnosis Date Noted   Elevated serum creatinine 01/09/2023   Fibromyalgia 01/06/2022   Type 2 diabetes mellitus without complications ([place]) [id number]   History of lumbar surgery 04/15/2020   Chronic pain syndrome 04/15/2020   Coronary artery disease 05/02/2017   Essential hypertension 05/02/2017   Hyperlipidemia LDL goal <70 05/02/2017    Social Hx   Social History   Socioeconomic History   Marital status: Married    Spouse name: Not on file   Number of children: Not on file   Years of education: Not on file   Highest education level: Not on file  Occupational History   Not on file  Tobacco Use   Smoking status: Former   Smokeless tobacco: Never   Tobacco comments:    quit smoking early 70's  Vaping Use   Vaping Use: Never used  Substance and Sexual Activity   Alcohol use: No   Drug use: No   Sexual activity: Not Currently  Other Topics Concern   Not on file  Social History Narrative   Not on file   Social Determinants of Health   Financial Resource Strain: Low Risk  (08/23/2022)   Overall Financial Resource Strain (CARDIA)    Difficulty of Paying Living Expenses: Not hard at all  Food Insecurity: No Food Insecurity (08/23/2022)   Hunger Vital Sign    Worried About Running Out of Food in the Last Year: Never true     Ran Out of Food in the Last Year: Never true  Transportation Needs: No Transportation Needs (08/23/2022)   PRAPARE - Hydrologist (Medical): No    Lack of Transportation (Non-Medical): No  Physical Activity: Inactive (08/23/2022)   Exercise Vital Sign    Days of Exercise per Week: 0 days    Minutes of Exercise per Session: 0 min  Stress: No Stress Concern Present (08/23/2022)   [place]    Feeling of Stress : Only a little  Social Connections: Moderately Integrated (08/23/2022)   Social Connection and Isolation Panel [NHANES]    Frequency of Communication with Friends and Family: More than three times a week    Frequency of Social Gatherings with Friends and Family: Twice a week    Attends Religious Services: More than 4 times per year    Active Member of [employer] or Organizations: No    Attends Music therapist: Never    Marital Status: Married    Review of Systems Per HPI  Objective:  BP 118/62   Pulse 71   Temp 98.2 F (36.8 C)   Ht 5\' 9"  (1.753 m)   Wt (!) 309 lb (140.2 kg)   SpO2 96%   BMI 45.63 kg/m  01/07/2023    1:43 PM 08/23/2022    2:16 PM 07/09/2022    1:36 PM  BP/Weight  Systolic BP 123456  [id number]  Diastolic BP 62  60  Wt. (Lbs) 309 302 302  BMI 45.63 kg/m2 44.6 kg/m2 44.6 kg/m2    Physical Exam Vitals and nursing note reviewed.  Constitutional:      General: He is not in acute distress.    Appearance: Normal appearance. He is obese.  HENT:     Head: Normocephalic and atraumatic.  Eyes:     General:        Right eye: No discharge.        Left eye: No discharge.     Conjunctiva/sclera: Conjunctivae normal.  Cardiovascular:     Rate and Rhythm: Normal rate and regular rhythm.  Pulmonary:     Effort: Pulmonary effort is normal.     Breath sounds: Normal breath sounds.  Neurological:     Mental Status: He is alert.  Psychiatric:         Mood and Affect: Mood normal.        Behavior: Behavior normal.    Lab Results  Component Value Date   WBC 8.2 01/05/2022   HGB 13.6 01/05/2022   HCT 40.6 01/05/2022   PLT 238 01/05/2022   GLUCOSE 124 (H) 01/07/2023   CHOL 138 01/07/2023   TRIG 242 (H) 01/07/2023   HDL 37 (L) 01/07/2023   LDLCALC 62 01/07/2023   ALT 22 01/07/2023   AST 23 01/07/2023   NA 142 01/07/2023   K 5.5 (H) 01/07/2023   CL 104 01/07/2023   CREATININE 1.44 (H) 01/07/2023   BUN 31 (H) 01/07/2023   CO2 21 01/07/2023   INR 0.98 09/27/2010   HGBA1C 7.5 (H) 01/07/2023     Assessment & Plan:   Problem List Items Addressed This Visit       Cardiovascular and Mediastinum   Essential hypertension - Primary    Well controlled on lisinopril/hctz. Creatinine elevated today. Will recheck in 3 months. May need to back of HCTZ.        Endocrine   Type 2 diabetes mellitus without complications (HCC)    123XX123 returned elevated @ 7.5. Recommending increase in Metformin. CrCl allows for this.      Relevant Orders   CMP14+EGFR (Completed)   Hemoglobin A1c (Completed)     Other   Elevated serum creatinine   Hyperlipidemia LDL goal <70    LDL returned at 62. Continue Simvastatin.      Relevant Orders   Lipid panel (Completed)   Other Visit Diagnoses     Screening for deficiency anemia           Meds ordered this encounter  Medications   albuterol (VENTOLIN HFA) 108 (90 Base) MCG/ACT inhaler    Sig: Inhale 2 puffs into the lungs every 6 (six) hours as needed for wheezing or shortness of breath.    Dispense:  18 g    Refill:  1    Follow-up:  6 months.   [place]

## 2023-01-09 NOTE — Assessment & Plan Note (Signed)
Well controlled on lisinopril/hctz. Creatinine elevated today. Will recheck in 3 months. May need to back of HCTZ.

## 2023-01-09 NOTE — Assessment & Plan Note (Signed)
A1C returned elevated @ 7.5. Recommending increase in Metformin. CrCl allows for this.

## 2023-01-09 NOTE — Assessment & Plan Note (Signed)
LDL returned at 62. Continue Simvastatin.

## 2023-02-18 DIAGNOSIS — M25519 Pain in unspecified shoulder: Secondary | ICD-10-CM | POA: Diagnosis not present

## 2023-02-18 DIAGNOSIS — F419 Anxiety disorder, unspecified: Secondary | ICD-10-CM | POA: Diagnosis not present

## 2023-02-18 DIAGNOSIS — M797 Fibromyalgia: Secondary | ICD-10-CM | POA: Diagnosis not present

## 2023-02-18 DIAGNOSIS — G894 Chronic pain syndrome: Secondary | ICD-10-CM | POA: Diagnosis not present

## 2023-02-18 DIAGNOSIS — Z79899 Other long term (current) drug therapy: Secondary | ICD-10-CM | POA: Diagnosis not present

## 2023-02-18 DIAGNOSIS — M5412 Radiculopathy, cervical region: Secondary | ICD-10-CM | POA: Diagnosis not present

## 2023-02-18 DIAGNOSIS — M25561 Pain in right knee: Secondary | ICD-10-CM | POA: Diagnosis not present

## 2023-02-22 ENCOUNTER — Telehealth: Payer: Self-pay | Admitting: Family Medicine

## 2023-02-22 DIAGNOSIS — E785 Hyperlipidemia, unspecified: Secondary | ICD-10-CM

## 2023-02-22 MED ORDER — SIMVASTATIN 40 MG PO TABS: ORAL_TABLET | ORAL | 0 refills | Status: DC

## 2023-02-22 NOTE — Telephone Encounter (Signed)
Received via fax Rx request: Prescription sent electronically to pharmacy  

## 2023-02-22 NOTE — Telephone Encounter (Signed)
Refill on  simvastatin (ZOCOR) 40 MG tablet  [facility] [address] last seen 01/07/23

## 2023-02-25 ENCOUNTER — Ambulatory Visit: Payer: Medicare Other | Admitting: Family Medicine

## 2023-04-09 ENCOUNTER — Other Ambulatory Visit: Payer: Self-pay | Admitting: Family Medicine

## 2023-04-11 ENCOUNTER — Telehealth: Payer: Self-pay | Admitting: Family Medicine

## 2023-04-11 MED ORDER — METFORMIN HCL 500 MG PO TABS: 500.0000 mg | ORAL_TABLET | Freq: Two times a day (BID) | ORAL | 3 refills | Status: DC

## 2023-04-11 NOTE — Telephone Encounter (Signed)
Refill on  metFORMIN (GLUCOPHAGE) 500 MG tablet  sent to [facility].

## 2023-04-22 DIAGNOSIS — F419 Anxiety disorder, unspecified: Secondary | ICD-10-CM | POA: Diagnosis not present

## 2023-04-22 DIAGNOSIS — M25519 Pain in unspecified shoulder: Secondary | ICD-10-CM | POA: Diagnosis not present

## 2023-04-22 DIAGNOSIS — Z79899 Other long term (current) drug therapy: Secondary | ICD-10-CM | POA: Diagnosis not present

## 2023-04-22 DIAGNOSIS — M25561 Pain in right knee: Secondary | ICD-10-CM | POA: Diagnosis not present

## 2023-04-22 DIAGNOSIS — M5412 Radiculopathy, cervical region: Secondary | ICD-10-CM | POA: Diagnosis not present

## 2023-04-22 DIAGNOSIS — M797 Fibromyalgia: Secondary | ICD-10-CM | POA: Diagnosis not present

## 2023-04-22 DIAGNOSIS — G894 Chronic pain syndrome: Secondary | ICD-10-CM | POA: Diagnosis not present

## 2023-04-30 ENCOUNTER — Other Ambulatory Visit: Payer: Self-pay | Admitting: Family Medicine

## 2023-04-30 DIAGNOSIS — I10 Essential (primary) hypertension: Secondary | ICD-10-CM

## 2023-06-09 ENCOUNTER — Telehealth: Payer: Self-pay | Admitting: Family Medicine

## 2023-06-09 DIAGNOSIS — E785 Hyperlipidemia, unspecified: Secondary | ICD-10-CM

## 2023-06-09 MED ORDER — SIMVASTATIN 40 MG PO TABS
ORAL_TABLET | ORAL | 0 refills | Status: DC
Start: 2023-06-09 — End: 2023-09-29

## 2023-06-09 NOTE — Telephone Encounter (Signed)
Refill on  simvastatin (ZOCOR) 40 MG tablet  send to [facility] [address]

## 2023-06-09 NOTE — Telephone Encounter (Signed)
Received via fax Rx request: Prescription sent electronically to pharmacy  

## 2023-06-17 DIAGNOSIS — M25511 Pain in right shoulder: Secondary | ICD-10-CM | POA: Diagnosis not present

## 2023-06-26 ENCOUNTER — Emergency Department (HOSPITAL_COMMUNITY): Payer: Medicare Other

## 2023-06-26 ENCOUNTER — Other Ambulatory Visit: Payer: Self-pay

## 2023-06-26 ENCOUNTER — Encounter (HOSPITAL_COMMUNITY): Payer: Self-pay | Admitting: *Deleted

## 2023-06-26 ENCOUNTER — Emergency Department (HOSPITAL_COMMUNITY)
Admission: EM | Admit: 2023-06-26 | Discharge: 2023-06-26 | Disposition: A | Discharge status: Home / Self Care | Payer: Medicare Other | Source: Home / Self Care | Attending: Emergency Medicine | Admitting: Emergency Medicine

## 2023-06-26 DIAGNOSIS — Z20822 Contact with and (suspected) exposure to covid-19: Secondary | ICD-10-CM | POA: Insufficient documentation

## 2023-06-26 DIAGNOSIS — Z7982 Long term (current) use of aspirin: Secondary | ICD-10-CM | POA: Diagnosis not present

## 2023-06-26 DIAGNOSIS — I517 Cardiomegaly: Secondary | ICD-10-CM | POA: Diagnosis not present

## 2023-06-26 DIAGNOSIS — R0989 Other specified symptoms and signs involving the circulatory and respiratory systems: Secondary | ICD-10-CM | POA: Diagnosis not present

## 2023-06-26 DIAGNOSIS — R059 Cough, unspecified: Secondary | ICD-10-CM | POA: Diagnosis not present

## 2023-06-26 DIAGNOSIS — J069 Acute upper respiratory infection, unspecified: Secondary | ICD-10-CM | POA: Diagnosis not present

## 2023-06-26 DIAGNOSIS — B9789 Other viral agents as the cause of diseases classified elsewhere: Secondary | ICD-10-CM | POA: Diagnosis not present

## 2023-06-26 DIAGNOSIS — R0602 Shortness of breath: Secondary | ICD-10-CM | POA: Diagnosis not present

## 2023-06-26 LAB — RESP PANEL BY RT-PCR (RSV, FLU A&B, COVID)  RVPGX2
Influenza A by PCR: NEGATIVE
Influenza B by PCR: NEGATIVE
Resp Syncytial Virus by PCR: NEGATIVE
SARS Coronavirus 2 by RT PCR: NEGATIVE

## 2023-06-26 NOTE — ED Notes (Signed)
Pt ambulated to restroom with difficulty or assistance

## 2023-06-26 NOTE — Discharge Instructions (Signed)
Continue using Robitussin and your Albuterol inhaler for relief of mild symptoms of upper respiratory infection. Follow up with your doctor if symptoms persist.

## 2023-06-26 NOTE — ED Provider Notes (Signed)
[place] EMERGENCY DEPARTMENT AT [facility] Provider Note   CSN: [id number] Arrival date & time: 06/26/23  1816     History  Chief Complaint  Patient presents with   Cough    Michael Barr is a 77 y.o. male.  Patient with 3 days of cough that causes mild SOB, congestion and headache. No fever, nausea or vomiting. He reports using Robitussin for cough and his Albuterol inhaler about every 6 hours for relief of symptoms and this is working well. He denies chest pain. He is here with his wife who is also a patient with unrelated symptoms.   The history is provided by the patient. No language interpreter was used.  Cough      Home Medications Prior to Admission medications   Medication Sig Start Date End Date Taking? Authorizing Provider  acetaminophen (TYLENOL) 500 MG tablet Take 500 mg by mouth 3 (three) times daily.    [provider]  albuterol (VENTOLIN HFA) 108 (90 Base) MCG/ACT inhaler Inhale 2 puffs into the lungs every 6 (six) hours as needed for wheezing or shortness of breath. 01/07/23   Tommie Sams, DO  ALPRAZolam Prudy Feeler) 0.25 MG tablet Take 0.25 mg by mouth 3 (three) times daily. 04/15/19   [provider]  aspirin EC 325 MG tablet Take 325 mg by mouth daily.    [provider]  diphenhydrAMINE (BENADRYL) 25 mg capsule Take 25 mg by mouth at bedtime as needed for itching or allergies.    [provider]  gabapentin (NEURONTIN) 300 MG capsule Take 300 mg by mouth See admin instructions. Takes 1 capsule 3 x per day and 2 caps at bedtime    [provider]  lisinopril-hydrochlorothiazide (ZESTORETIC) 20-25 MG tablet TAKE 1 TABLET BY MOUTH EVERY DAY IN THE MORNING 05/02/23   Tommie Sams, DO  metFORMIN (GLUCOPHAGE) 500 MG tablet Take 1 tablet (500 mg total) by mouth 2 (two) times daily with a meal. 04/11/23   Cook, Verdis Frederickson, DO  morphine (MS CONTIN) 15 MG 12 hr tablet Take 15 mg by mouth 3 (three) times daily. 12/01/21    [provider]  Multiple Vitamins-Minerals (MULTIVITAMIN WITH MINERALS) tablet Take 1 tablet by mouth daily.    [provider]  nitroGLYCERIN (NITROSTAT) 0.4 MG SL tablet Place 1 tablet (0.4 mg total) under the tongue every 5 (five) minutes as needed for chest pain. 05/03/22   Babs Sciara, MD  Oxycodone HCl 10 MG TABS Take 10 mg by mouth 3 (three) times daily. Taking 15 mg 4 x per day    [provider]  Potassium 99 MG TABS Take 99 mg by mouth daily as needed (fatigue).     [provider]  Propylhexedrine (BENZEDREX NA) Place 1 Inhaler into the nose as needed (congestion.).    [provider]  senna (SENOKOT) 8.6 MG TABS tablet Take 1-2 tablets by mouth daily as needed (constipation.).     [provider]  simvastatin (ZOCOR) 40 MG tablet Take 1 tablet by mouth at bedtime 06/09/23   Everlene Other G, DO  traZODone (DESYREL) 100 MG tablet Take 250 mg by mouth at bedtime.     [provider]      Allergies    Penicillins    Review of Systems   Review of Systems  Respiratory:  Positive for cough.     Physical Exam Updated Vital Signs BP (!) 153/82   Pulse 64   Temp 98.3  F (36.8 C) (Oral)   Resp 17   Ht 5\' 9"  (1.753 m)   Wt 133.8 kg   SpO2 95%   BMI 43.56 kg/m  Physical Exam Vitals and nursing note reviewed.  Constitutional:      Appearance: Normal appearance.  HENT:     Nose: Nose normal.     Mouth/Throat:     Mouth: Mucous membranes are moist.  Cardiovascular:     Rate and Rhythm: Normal rate and regular rhythm.     Heart sounds: No murmur heard. Pulmonary:     Effort: Pulmonary effort is normal.     Breath sounds: Normal breath sounds. No wheezing, rhonchi or rales.  Abdominal:     Tenderness: There is no abdominal tenderness.  Musculoskeletal:        General: Normal range of motion.     Cervical back: Normal range of motion and neck supple.  Skin:    General: Skin is warm and dry.  Neurological:      Mental Status: He is alert and oriented to person, place, and time.     ED Results / Procedures / Treatments   Labs (all labs ordered are listed, but only abnormal results are displayed) Labs Reviewed  RESP PANEL BY RT-PCR (RSV, FLU A&B, COVID)  [id number]    EKG None  Radiology DG Chest Portable 1 View  Result Date: 06/26/2023 CLINICAL DATA:  Shortness of breath and cough EXAM: PORTABLE CHEST 1 VIEW COMPARISON:  Radiograph 12/15/2021 FINDINGS: Stable cardiomegaly. Sternotomy and CABG. Pulmonary venous congestion. No focal consolidation, pleural effusion, or pneumothorax. Thoracolumbar fusion hardware partially visualized. IMPRESSION: No acute cardiopulmonary process.  Cardiomegaly. Electronically Signed   By: Minerva Fester M.D.   On: 06/26/2023 19:24    Procedures Procedures    Medications Ordered in ED Medications - No data to display  ED Course/ Medical Decision Making/ A&P                                 Medical Decision Making Amount and/or Complexity of Data Reviewed Radiology: ordered.           Final Clinical Impression(s) / ED Diagnoses Final diagnoses:  Viral URI with cough    Rx / DC Orders ED Discharge Orders     None         Danne Harbor 06/26/23 2007    Vanetta Mulders, MD 06/26/23 2330

## 2023-06-26 NOTE — ED Notes (Signed)
Pt able to walk from triage Pt complains of HA, SOB and Cough for 3 days Yellow sputum  Taking rescue inhaler 4x daily  Denies fever, nv/d  Orders placed for COVID test and CXR Waiting on further orders and MD eval

## 2023-06-26 NOTE — ED Triage Notes (Signed)
Pt with cough and chest congestion x 3 days ago.

## 2023-07-04 ENCOUNTER — Telehealth: Payer: Self-pay

## 2023-07-04 ENCOUNTER — Other Ambulatory Visit: Payer: Self-pay | Admitting: Family Medicine

## 2023-07-04 MED ORDER — DOXYCYCLINE HYCLATE 100 MG PO TABS: 100.0000 mg | ORAL_TABLET | Freq: Two times a day (BID) | ORAL | 0 refills | Status: DC

## 2023-07-04 NOTE — Telephone Encounter (Signed)
Pt called wanted to know if someone can please call him ins. med he has no way of transportation and not way to  [facility] [employer] [phone]

## 2023-07-04 NOTE — Telephone Encounter (Signed)
Spoke with the wife and informed th antibiotics have been sent in

## 2023-07-04 NOTE — Telephone Encounter (Signed)
I give the wrong number it is [phone]

## 2023-07-04 NOTE — Telephone Encounter (Signed)
Patient has been seen by urgent care and is still really congested and wheezing , asks for an antibiotics , please advise

## 2023-07-04 NOTE — Telephone Encounter (Signed)
Left message for a return call

## 2023-07-11 ENCOUNTER — Telehealth: Payer: Self-pay

## 2023-07-11 NOTE — Telephone Encounter (Signed)
Transition Care Management Unsuccessful Follow-up Telephone Call  Date of discharge and from where:  06/26/2023 [facility]  Attempts:  1st Attempt  Reason for unsuccessful TCM follow-up call:  Left voice message  Otie Headlee [facility]  [facility], [facility] Resource Care Guide Direct Dial: [phone]  Website: [email or web address]

## 2023-07-12 ENCOUNTER — Telehealth: Payer: Self-pay

## 2023-07-12 NOTE — Telephone Encounter (Signed)
Transition Care Management Unsuccessful Follow-up Telephone Call  Date of discharge and from where:  06/26/2023 [facility]  Attempts:  2nd Attempt  Reason for unsuccessful TCM follow-up call:  Left voice message  Naseer Hearn [facility]  [facility], [facility] Resource Care Guide Direct Dial: [phone]  Website: [email or web address]

## 2023-07-15 DIAGNOSIS — Z79899 Other long term (current) drug therapy: Secondary | ICD-10-CM | POA: Diagnosis not present

## 2023-07-15 DIAGNOSIS — G894 Chronic pain syndrome: Secondary | ICD-10-CM | POA: Diagnosis not present

## 2023-07-15 DIAGNOSIS — M797 Fibromyalgia: Secondary | ICD-10-CM | POA: Diagnosis not present

## 2023-07-15 DIAGNOSIS — M545 Low back pain, unspecified: Secondary | ICD-10-CM | POA: Diagnosis not present

## 2023-07-15 DIAGNOSIS — M25561 Pain in right knee: Secondary | ICD-10-CM | POA: Diagnosis not present

## 2023-07-15 DIAGNOSIS — M5412 Radiculopathy, cervical region: Secondary | ICD-10-CM | POA: Diagnosis not present

## 2023-07-15 DIAGNOSIS — F419 Anxiety disorder, unspecified: Secondary | ICD-10-CM | POA: Diagnosis not present

## 2023-07-15 DIAGNOSIS — R413 Other amnesia: Secondary | ICD-10-CM | POA: Diagnosis not present

## 2023-07-29 DIAGNOSIS — Z23 Encounter for immunization: Secondary | ICD-10-CM | POA: Diagnosis not present

## 2023-09-16 ENCOUNTER — Ambulatory Visit (INDEPENDENT_AMBULATORY_CARE_PROVIDER_SITE_OTHER): Payer: Medicare Other

## 2023-09-16 VITALS — Ht 69.0 in | Wt 295.0 lb

## 2023-09-16 DIAGNOSIS — Z0000 Encounter for general adult medical examination without abnormal findings: Secondary | ICD-10-CM

## 2023-09-16 NOTE — Progress Notes (Signed)
Subjective:   Michael Barr is a 77 y.o. male who presents for Medicare Annual/Subsequent preventive examination.  Visit Complete: Virtual I connected with  Michael Barr on 09/16/23 by a audio enabled telemedicine application and verified that I am speaking with the correct person using two identifiers.  Patient Location: Home  Provider Location: Home Office  I discussed the limitations of evaluation and management by telemedicine. The patient expressed understanding and agreed to proceed.  Vital Signs: Because this visit was a virtual/telehealth visit, some criteria may be missing or patient reported. Any vitals not documented were not able to be obtained and vitals that have been documented are patient reported.  Cardiac Risk Factors include: advanced age (>19men, >29 women);diabetes mellitus;male gender;hypertension     Objective:    Today's Vitals   09/16/23 1744  Weight: 295 lb (133.8 kg)  Height: 5\' 9"  (1.753 m)   Body mass index is 43.56 kg/m.     09/16/2023    5:54 PM 06/26/2023    6:21 PM 08/23/2022    2:02 PM 07/09/2019   11:27 AM 07/05/2019    8:30 AM 08/14/2018    1:01 PM 01/28/2017    6:00 PM  Advanced Directives  Does Patient Have a Medical Advance Directive? No Yes No No No Yes Yes  Type of Advance Directive  Living will     Living will  Does patient want to make changes to medical advance directive?       Yes (Inpatient - patient defers changing a medical advance directive at this time)  Would patient like information on creating a medical advance directive? Yes (MAU/Ambulatory/Procedural Areas - Information given)  No - Patient declined No - Patient declined No - Patient declined      Current Medications (verified) Outpatient Encounter Medications as of 09/16/2023  Medication Sig   acetaminophen (TYLENOL) 500 MG tablet Take 500 mg by mouth 3 (three) times daily.   albuterol (VENTOLIN HFA) 108 (90 Base) MCG/ACT inhaler Inhale 2 puffs into the lungs  every 6 (six) hours as needed for wheezing or shortness of breath.   ALPRAZolam (XANAX) 0.25 MG tablet Take 0.25 mg by mouth 3 (three) times daily.   aspirin EC 325 MG tablet Take 325 mg by mouth daily.   diphenhydrAMINE (BENADRYL) 25 mg capsule Take 25 mg by mouth at bedtime as needed for itching or allergies.   doxycycline (VIBRA-TABS) 100 MG tablet Take 1 tablet (100 mg total) by mouth 2 (two) times daily.   gabapentin (NEURONTIN) 300 MG capsule Take 300 mg by mouth See admin instructions. Takes 1 capsule 3 x per day and 2 caps at bedtime   lisinopril-hydrochlorothiazide (ZESTORETIC) 20-25 MG tablet TAKE 1 TABLET BY MOUTH EVERY DAY IN THE MORNING   metFORMIN (GLUCOPHAGE) 500 MG tablet Take 1 tablet (500 mg total) by mouth 2 (two) times daily with a meal.   morphine (MS CONTIN) 15 MG 12 hr tablet Take 15 mg by mouth 3 (three) times daily.   Multiple Vitamins-Minerals (MULTIVITAMIN WITH MINERALS) tablet Take 1 tablet by mouth daily.   nitroGLYCERIN (NITROSTAT) 0.4 MG SL tablet Place 1 tablet (0.4 mg total) under the tongue every 5 (five) minutes as needed for chest pain.   Oxycodone HCl 10 MG TABS Take 10 mg by mouth 3 (three) times daily. Taking 15 mg 4 x per day   Potassium 99 MG TABS Take 99 mg by mouth daily as needed (fatigue).    Propylhexedrine (BENZEDREX NA) Place 1 Inhaler into  the nose as needed (congestion.).   senna (SENOKOT) 8.6 MG TABS tablet Take 1-2 tablets by mouth daily as needed (constipation.).    simvastatin (ZOCOR) 40 MG tablet Take 1 tablet by mouth at bedtime   traZODone (DESYREL) 100 MG tablet Take 250 mg by mouth at bedtime.    No facility-administered encounter medications on file as of 09/16/2023.    Allergies (verified) Penicillins   History: Past Medical History:  Diagnosis Date   Anxiety    takes Xanax and Methadone daily   Arthritis    low back- DDD   Asthma 70's   Cataract    immature and to the left eye   Chronic back pain    HNP   Constipation     takes Sennokot nightly   Coronary artery disease    Depression    attacks   Fibromyalgia    GERD (gastroesophageal reflux disease)    20 YRS AGO, AND DIET CONTROL   History of kidney stones    Hyperlipidemia    takes Zocor daily   Hypertension    takes Zestoretic daily   Hypokalemia    takes Potassium daily   Insomnia    takes trazodone nightly   Joint pain    Joint swelling    Nocturia    Pneumonia 70's   Type 2 diabetes mellitus without complications (HCC)    Weakness    right leg   Past Surgical History:  Procedure Laterality Date   APPLICATION OF INTRAOPERATIVE CT SCAN N/A 08/21/2018   Procedure: APPLICATION OF INTRAOPERATIVE CT SCAN;  Surgeon: Donalee Citrin, MD;  Location[facility] OR;  Service: Neurosurgery;  Laterality: N/A;   BACK SURGERY     multiple back surgeries   CARDIAC CATHETERIZATION     unsure of how long ago   CARPAL TUNNEL RELEASE Bilateral 70's   CHOLECYSTECTOMY  1985   COLONOSCOPY     COLONOSCOPY WITH PROPOFOL N/A 07/09/2019   Procedure: COLONOSCOPY WITH PROPOFOL;  Surgeon: Corbin Ade, MD;  Location: [facility] ENDO SUITE;  Service: Endoscopy;  Laterality: N/A;  12:15pm   CORONARY ARTERY BYPASS GRAFT  1996   1 vessel   HARDWARE REMOVAL N/A 03/26/2016   Procedure: HARDWARE REMOVAL Lumbar one-two ;  Surgeon: Donalee Citrin, MD;  Location: [facility] NEURO ORS;  Service: Neurosurgery;  Laterality: N/A;   JOINT REPLACEMENT  1999   lt knee   KNEE ARTHROSCOPY Right    LUMBAR LAMINECTOMY/DECOMPRESSION MICRODISCECTOMY Right 09/26/2013   Procedure: LUMBAR LAMINECTOMY/DECOMPRESSION MICRODISCECTOMY RIGHT THREE-FOUR;  Surgeon: Mariam Dollar, MD;  Location: [facility] NEURO ORS;  Service: Neurosurgery;  Laterality: Right;   pilondial cyst     x 2    POLYPECTOMY  07/09/2019   Procedure: POLYPECTOMY;  Surgeon: Corbin Ade, MD;  Location: [facility] SUITE;  Service: Endoscopy;;  transverse colon polyps x 2   POSTERIOR LUMBAR FUSION 4 LEVEL N/A 08/21/2018   Procedure: Posterior Lateral Fusion  Thoracic Eight to Pelvis with Iliac screws and Fenestrated screws with kyphon and AIRO;  Surgeon: Donalee Citrin, MD;  Location: [facility] OR;  Service: Neurosurgery;  Laterality: N/A;   SHOULDER ARTHROSCOPY Bilateral    left x 2 and right x 1   TONSILLECTOMY     tumor removed from right ankle   70's   Family History  Problem Relation Age of Onset   Colon cancer Neg Hx    Social History   Socioeconomic History   Marital status: Married    Spouse name: Not on  file   Number of children: Not on file   Years of education: Not on file   Highest education level: 12th grade  Occupational History   Not on file  Tobacco Use   Smoking status: Former   Smokeless tobacco: Never   Tobacco comments:    quit smoking early 70's  Vaping Use   Vaping status: Never Used  Substance and Sexual Activity   Alcohol use: No   Drug use: No   Sexual activity: Not Currently  Other Topics Concern   Not on file  Social History Narrative   Not on file   Social Determinants of Health   Financial Resource Strain: Low Risk  (09/16/2023)   Overall Financial Resource Strain (CARDIA)    Difficulty of Paying Living Expenses: Not hard at all  Food Insecurity: No Food Insecurity (09/16/2023)   Hunger Vital Sign    Worried About Running Out of Food in the Last Year: Never true    Ran Out of Food in the Last Year: Never true  Transportation Needs: No Transportation Needs (09/16/2023)   PRAPARE - Administrator, Civil Service (Medical): No    Lack of Transportation (Non-Medical): No  Physical Activity: Inactive (09/16/2023)   Exercise Vital Sign    Days of Exercise per Week: 0 days    Minutes of Exercise per Session: 0 min  Stress: No Stress Concern Present (09/16/2023)   [employer] of Occupational Health - Occupational Stress Questionnaire    Feeling of Stress : Not at all  Social Connections: Socially Integrated (09/16/2023)   Social Connection and Isolation Panel [NHANES]    Frequency of  Communication with Friends and Family: More than three times a week    Frequency of Social Gatherings with Friends and Family: Three times a week    Attends Religious Services: More than 4 times per year    Active Member of Clubs or Organizations: Yes    Attends Engineer, structural: More than 4 times per year    Marital Status: Married    Tobacco Counseling Counseling given: Not Answered Tobacco comments: quit smoking early 70's   Clinical Intake:  Pre-visit preparation completed: Yes  Pain : No/denies pain     Diabetes: Yes CBG done?: No Did pt. bring in CBG monitor from home?: No  How often do you need to have someone help you when you read instructions, pamphlets, or other written materials from your doctor or pharmacy?: 1 - Never  Interpreter Needed?: No  Information entered by :: Kandis Fantasia LPN   Activities of Daily Living    09/16/2023    5:51 PM  In your present state of health, do you have any difficulty performing the following activities:  Hearing? 0  Vision? 0  Difficulty concentrating or making decisions? 0  Walking or climbing stairs? 1  Dressing or bathing? 0  Doing errands, shopping? 1  Preparing Food and eating ? N  Using the Toilet? N  In the past six months, have you accidently leaked urine? N  Do you have problems with loss of bowel control? N  Managing your Medications? N  Managing your Finances? N  Housekeeping or managing your Housekeeping? N    Patient Care Team: Tommie Sams, DO as PCP - General (Family Medicine) Jena Gauss Gerrit Friends, MD as Consulting Physician (Gastroenterology) Mathews Argyle (Orthopedic Surgery)  Indicate any recent Medical Services you may have received from other than [facility] providers in the past year (  date may be approximate).     Assessment:   This is a routine wellness examination for Baker.  Hearing/Vision screen Hearing Screening - Comments:: Denies hearing difficulties   Vision  Screening - Comments:: No vision problems; will schedule routine eye exam soon     Goals Addressed   None   Depression Screen    09/16/2023    5:53 PM 08/23/2022    2:00 PM 01/05/2022    1:21 PM 04/17/2021    1:27 PM 10/15/2020    1:14 PM 11/02/2017    2:38 PM  PHQ 2/9 Scores  PHQ - 2 Score 0 0 0 0 0 0  PHQ- 9 Score  0        Fall Risk    09/16/2023    5:55 PM 08/23/2022    2:04 PM 08/20/2022    4:46 PM 07/09/2022    1:44 PM 01/05/2022    1:20 PM  Fall Risk   Falls in the past year? 0 1 1 1  0  Number falls in past yr: 0 0 0 1 0  Injury with Fall? 0 0 0 1 0  Risk for fall due to : Impaired mobility;Impaired balance/gait History of fall(s)  No Fall Risks;History of fall(s) No Fall Risks  Follow up Falls prevention discussed;Education provided;Falls evaluation completed Falls prevention discussed;Falls evaluation completed  Falls evaluation completed Falls evaluation completed    MEDICARE RISK AT HOME: Medicare Risk at Home Any stairs in or around the home?: No If so, are there any without handrails?: No Home free of loose throw rugs in walkways, pet beds, electrical cords, etc?: Yes Adequate lighting in your home to reduce risk of falls?: Yes Life alert?: No Use of a cane, walker or w/c?: Yes Grab bars in the bathroom?: Yes Shower chair or bench in shower?: No Elevated toilet seat or a handicapped toilet?: Yes  TIMED UP AND GO:  Was the test performed?  No    Cognitive Function:        09/16/2023    5:55 PM 08/23/2022    2:09 PM  6CIT Screen  What Year? 0 points 0 points  What month? 0 points 0 points  What time? 0 points 0 points  Count back from 20 0 points 0 points  Months in reverse 0 points 0 points  Repeat phrase 0 points 0 points  Total Score 0 points 0 points    Immunizations Immunization History  Administered Date(s) Administered   Influenza, High Dose Seasonal PF 08/16/2017, 10/01/2018   Influenza-Unspecified 09/25/2019   Pneumococcal  Polysaccharide-23 10/23/2005, 07/25/2015   Pneumococcal-Unspecified 11/06/2017    TDAP status: Due, Education has been provided regarding the importance of this vaccine. Advised may receive this vaccine at local pharmacy or Health Dept. Aware to provide a copy of the vaccination record if obtained from local pharmacy or Health Dept. Verbalized acceptance and understanding.  Flu Vaccine status: Up to date  Pneumococcal vaccine status: Up to date  Covid-19 vaccine status: Information provided on how to obtain vaccines.   Qualifies for Shingles Vaccine? Yes   Zostavax completed No   Shingrix Completed?: No.    Education has been provided regarding the importance of this vaccine. Patient has been advised to call insurance company to determine out of pocket expense if they have not yet received this vaccine. Advised may also receive vaccine at local pharmacy or Health Dept. Verbalized acceptance and understanding.  Screening Tests Health Maintenance  Topic Date Due   OPHTHALMOLOGY EXAM  Never done   Hepatitis C Screening  Never done   DTaP/Tdap/Td (1 - Tdap) Never done   Zoster Vaccines- Shingrix (1 of 2) Never done   Diabetic kidney evaluation - Urine ACR  07/10/2023   HEMOGLOBIN A1C  07/10/2023   Diabetic kidney evaluation - eGFR measurement  01/07/2024   FOOT EXAM  01/07/2024   Medicare Annual Wellness (AWV)  09/15/2024   Colonoscopy  07/08/2029   INFLUENZA VACCINE  Completed   HPV VACCINES  Aged Out   Pneumonia Vaccine 33+ Years old  Discontinued   COVID-19 Vaccine  Discontinued    Health Maintenance  Health Maintenance Due  Topic Date Due   OPHTHALMOLOGY EXAM  Never done   Hepatitis C Screening  Never done   DTaP/Tdap/Td (1 - Tdap) Never done   Zoster Vaccines- Shingrix (1 of 2) Never done   Diabetic kidney evaluation - Urine ACR  07/10/2023   HEMOGLOBIN A1C  07/10/2023    Colorectal cancer screening: Type of screening: Colonoscopy. Completed 07/09/19. Repeat every 10  years  Lung Cancer Screening: (Low Dose CT Chest recommended if Age 7-80 years, 20 pack-year currently smoking OR have quit w/in 15years.) does not qualify.   Lung Cancer Screening Referral: n/a  Additional Screening:  Hepatitis C Screening: does qualify  Vision Screening: Recommended annual ophthalmology exams for early detection of glaucoma and other disorders of the eye. Is the patient up to date with their annual eye exam?  No  Who is the provider or what is the name of the office in which the patient attends annual eye exams? none If pt is not established with a provider, would they like to be referred to a provider to establish care? No .   Dental Screening: Recommended annual dental exams for proper oral hygiene  Diabetic Foot Exam: Diabetic Foot Exam: Completed 3/15/ 24  Community Resource Referral / Chronic Care Management: CRR required this visit?  No   CCM required this visit?  No     Plan:     I have personally reviewed and noted the following in the patient's chart:   Medical and social history Use of alcohol, tobacco or illicit drugs  Current medications and supplements including opioid prescriptions. Patient is currently taking opioid prescriptions. Information provided to patient regarding non-opioid alternatives. Patient advised to discuss non-opioid treatment plan with their provider. Functional ability and status Nutritional status Physical activity Advanced directives List of other physicians Hospitalizations, surgeries, and ER visits in previous 12 months Vitals Screenings to include cognitive, depression, and falls Referrals and appointments  In addition, I have reviewed and discussed with patient certain preventive protocols, quality metrics, and best practice recommendations. A written personalized care plan for preventive services as well as general preventive health recommendations were provided to patient.     Kandis Fantasia [place],  [place]   [id number]   After Visit Summary: (MyChart) Due to this being a telephonic visit, the after visit summary with patients personalized plan was offered to patient via MyChart   Nurse Notes: No concerns at this time

## 2023-09-16 NOTE — Patient Instructions (Signed)
Mr. Michael Barr , Thank you for taking time to come for your Medicare Wellness Visit. I appreciate your ongoing commitment to your health goals. Please review the following plan we discussed and let me know if I can assist you in the future.   Referrals/Orders/Follow-Ups/Clinician Recommendations: Aim for 30 minutes of exercise or brisk walking, 6-8 glasses of water, and 5 servings of fruits and vegetables each day.  This is a list of the screening recommended for you and due dates:  Health Maintenance  Topic Date Due   Eye exam for diabetics  Never done   Hepatitis C Screening  Never done   DTaP/Tdap/Td vaccine (1 - Tdap) Never done   Zoster (Shingles) Vaccine (1 of 2) Never done   Yearly kidney health urinalysis for diabetes  07/10/2023   Hemoglobin A1C  07/10/2023   Yearly kidney function blood test for diabetes  01/07/2024   Complete foot exam   01/07/2024   Medicare Annual Wellness Visit  09/15/2024   Colon Cancer Screening  07/08/2029   Flu Shot  Completed   HPV Vaccine  Aged Out   Pneumonia Vaccine  Discontinued   COVID-19 Vaccine  Discontinued    Advanced directives: (ACP Link)Information on Advanced Care Planning can be found at [facility] Advance Health Care Directives Advance Health Care Directives ([email or web address]   Next Medicare Annual Wellness Visit scheduled for next year: Yes

## 2023-09-29 ENCOUNTER — Other Ambulatory Visit: Payer: Self-pay | Admitting: Family Medicine

## 2023-09-29 DIAGNOSIS — E785 Hyperlipidemia, unspecified: Secondary | ICD-10-CM

## 2023-09-29 MED ORDER — SIMVASTATIN 40 MG PO TABS: ORAL_TABLET | ORAL | 0 refills | Status: DC

## 2023-09-29 NOTE — Telephone Encounter (Signed)
Copied from CRM [id number]. Topic: Clinical - Medication Refill >> Sep 29, 2023  2:53 PM Alphonzo Lemmings O wrote: Most Recent Primary Care Visit:  Provider: Anthoney Harada  Department: [facility]  Visit Type: MEDICARE AWV, SEQUENTIAL  Date: 09/16/2023  Medication: simvastatin (ZOCOR) 40 MG tablet  Has the patient contacted their pharmacy? No ( looked at date wand it was in August ) (Agent: If no, request that the patient contact the pharmacy for the refill. If patient does not wish to contact the pharmacy document the reason why and proceed with request.) (Agent: If yes, when and what did the pharmacy advise?)  Is this the correct pharmacy for this prescription? Yes If no, delete pharmacy and type the correct one.  This is the patient's preferred pharmacy:  [facility] [address] [place] - [address] [place] [address] Phone: [phone] Fax: [phone]  [id number] - [place], [place] - [address] AT [facility] [place] [address] [place] [address] Phone: [phone] Fax: [phone]   Has the prescription been filled recently? Yes (06/09/2023) 90 day supply  Is the patient out of the medication? Yes  Has the patient been seen for an appointment in the last year OR does the patient have an upcoming appointment?   Can we respond through MyChart?   Agent: Please be advised that Rx refills may take up to 3 business days. We ask that you follow-up with your pharmacy.

## 2023-10-14 DIAGNOSIS — Z5181 Encounter for therapeutic drug level monitoring: Secondary | ICD-10-CM | POA: Diagnosis not present

## 2023-10-14 DIAGNOSIS — F419 Anxiety disorder, unspecified: Secondary | ICD-10-CM | POA: Diagnosis not present

## 2023-10-14 DIAGNOSIS — Z79899 Other long term (current) drug therapy: Secondary | ICD-10-CM | POA: Diagnosis not present

## 2023-10-14 DIAGNOSIS — M25531 Pain in right wrist: Secondary | ICD-10-CM | POA: Diagnosis not present

## 2023-10-14 DIAGNOSIS — M25561 Pain in right knee: Secondary | ICD-10-CM | POA: Diagnosis not present

## 2023-10-14 DIAGNOSIS — M25519 Pain in unspecified shoulder: Secondary | ICD-10-CM | POA: Diagnosis not present

## 2023-10-14 DIAGNOSIS — M5412 Radiculopathy, cervical region: Secondary | ICD-10-CM | POA: Diagnosis not present

## 2023-10-14 DIAGNOSIS — M25562 Pain in left knee: Secondary | ICD-10-CM | POA: Diagnosis not present

## 2023-10-14 DIAGNOSIS — M797 Fibromyalgia: Secondary | ICD-10-CM | POA: Diagnosis not present

## 2023-10-14 DIAGNOSIS — G894 Chronic pain syndrome: Secondary | ICD-10-CM | POA: Diagnosis not present

## 2023-11-15 DIAGNOSIS — L570 Actinic keratosis: Secondary | ICD-10-CM | POA: Diagnosis not present

## 2023-11-15 DIAGNOSIS — Z85828 Personal history of other malignant neoplasm of skin: Secondary | ICD-10-CM | POA: Diagnosis not present

## 2023-11-15 DIAGNOSIS — L821 Other seborrheic keratosis: Secondary | ICD-10-CM | POA: Diagnosis not present

## 2023-12-03 ENCOUNTER — Other Ambulatory Visit: Payer: Self-pay | Admitting: Family Medicine

## 2023-12-03 DIAGNOSIS — I10 Essential (primary) hypertension: Secondary | ICD-10-CM

## 2023-12-05 ENCOUNTER — Other Ambulatory Visit: Payer: Self-pay

## 2023-12-05 DIAGNOSIS — I10 Essential (primary) hypertension: Secondary | ICD-10-CM

## 2023-12-05 MED ORDER — LISINOPRIL-HYDROCHLOROTHIAZIDE 20-25 MG PO TABS
ORAL_TABLET | ORAL | 1 refills | Status: DC
Start: 2023-12-05 — End: 2023-12-27

## 2023-12-15 ENCOUNTER — Ambulatory Visit: Payer: Medicare Other | Admitting: Family Medicine

## 2023-12-15 ENCOUNTER — Encounter: Payer: Self-pay | Admitting: Family Medicine

## 2023-12-15 VITALS — BP 144/80 | HR 82 | Temp 97.9°F | Ht 69.0 in | Wt 303.0 lb

## 2023-12-15 DIAGNOSIS — J329 Chronic sinusitis, unspecified: Secondary | ICD-10-CM | POA: Insufficient documentation

## 2023-12-15 DIAGNOSIS — J019 Acute sinusitis, unspecified: Secondary | ICD-10-CM | POA: Diagnosis not present

## 2023-12-15 MED ORDER — DOXYCYCLINE HYCLATE 100 MG PO TABS: 100.0000 mg | ORAL_TABLET | Freq: Two times a day (BID) | ORAL | 0 refills | Status: DC

## 2023-12-15 NOTE — Patient Instructions (Signed)
Medication as prescribed.  If you worsen or fail to improve, please let us know.  Dr. Adriana Simas

## 2023-12-15 NOTE — Assessment & Plan Note (Signed)
Treating with doxycycline. 

## 2023-12-15 NOTE — Progress Notes (Signed)
Subjective:  Patient ID: Michael Barr, male    DOB: 12/27/1945  Age: 78 y.o. MRN: [id number]  CC:   Chief Complaint  Patient presents with   Sinusitis    Congestion  x's 1 month and cough x's 1 week.     HPI:  78 year old male presents with the above complaints.  Patient reports ongoing symptoms over the past month.  Reports congestion, sneezing.  Reports sinus pressure/congestion.  No fever.  Has had no improvement with over-the-counter treatment.  No other complaints concerns at this time.  Patient Active Problem List   Diagnosis Date Noted   Sinusitis 12/15/2023   Elevated serum creatinine 01/09/2023   Fibromyalgia 01/06/2022   Type 2 diabetes mellitus without complications (HCC) 04/15/2020   History of lumbar surgery 04/15/2020   Chronic pain syndrome 04/15/2020   Coronary artery disease 05/02/2017   Essential hypertension 05/02/2017   Hyperlipidemia LDL goal <70 05/02/2017    Social Hx   Social History   Socioeconomic History   Marital status: Married    Spouse name: Not on file   Number of children: Not on file   Years of education: Not on file   Highest education level: 12th grade  Occupational History   Not on file  Tobacco Use   Smoking status: Former   Smokeless tobacco: Never   Tobacco comments:    quit smoking early 70's  Vaping Use   Vaping status: Never Used  Substance and Sexual Activity   Alcohol use: No   Drug use: No   Sexual activity: Not Currently  Other Topics Concern   Not on file  Social History Narrative   Not on file   Social Drivers of Health   Financial Resource Strain: Low Risk  (09/16/2023)   Overall Financial Resource Strain (CARDIA)    Difficulty of Paying Living Expenses: Not hard at all  Food Insecurity: No Food Insecurity (09/16/2023)   Hunger Vital Sign    Worried About Running Out of Food in the Last Year: Never true    Ran Out of Food in the Last Year: Never true  Transportation Needs: No Transportation Needs  (09/16/2023)   PRAPARE - Administrator, Civil Service (Medical): No    Lack of Transportation (Non-Medical): No  Physical Activity: Inactive (09/16/2023)   Exercise Vital Sign    Days of Exercise per Week: 0 days    Minutes of Exercise per Session: 0 min  Stress: No Stress Concern Present (09/16/2023)   [employer] of Occupational Health - Occupational Stress Questionnaire    Feeling of Stress : Not at all  Social Connections: Socially Integrated (09/16/2023)   Social Connection and Isolation Panel [NHANES]    Frequency of Communication with Friends and Family: More than three times a week    Frequency of Social Gatherings with Friends and Family: Three times a week    Attends Religious Services: More than 4 times per year    Active Member of Clubs or Organizations: Yes    Attends Engineer, structural: More than 4 times per year    Marital Status: Married    Review of Systems Per HPI  Objective:  BP (!) 144/80   Pulse 82   Temp 97.9 F (36.6 C)   Ht 5\' 9"  (1.753 m)   Wt (!) 303 lb (137.4 kg)   SpO2 98%   BMI 44.75 kg/m      12/15/2023    3:45 PM 09/16/2023  5:44 PM 06/26/2023    7:45 PM  BP/Weight  Systolic BP 144 -- 153  Diastolic BP 80 -- 82  Wt. (Lbs) 303 295   BMI 44.75 kg/m2 43.56 kg/m2     Physical Exam Constitutional:      General: He is not in acute distress.    Appearance: Normal appearance. He is obese.  HENT:     Head: Normocephalic and atraumatic.     Nose: No rhinorrhea.  Eyes:     General:        Right eye: No discharge.        Left eye: No discharge.     Conjunctiva/sclera: Conjunctivae normal.  Cardiovascular:     Rate and Rhythm: Normal rate and regular rhythm.  Pulmonary:     Effort: Pulmonary effort is normal.     Breath sounds: Normal breath sounds. No wheezing, rhonchi or rales.  Neurological:     Mental Status: He is alert.     Lab Results  Component Value Date   WBC 8.2 01/05/2022   HGB 13.6  01/05/2022   HCT 40.6 01/05/2022   PLT 238 01/05/2022   GLUCOSE 124 (H) 01/07/2023   CHOL 138 01/07/2023   TRIG 242 (H) 01/07/2023   HDL 37 (L) 01/07/2023   LDLCALC 62 01/07/2023   ALT 22 01/07/2023   AST 23 01/07/2023   NA 142 01/07/2023   K 5.5 (H) 01/07/2023   CL 104 01/07/2023   CREATININE 1.44 (H) 01/07/2023   BUN 31 (H) 01/07/2023   CO2 21 01/07/2023   INR 0.98 09/27/2010   HGBA1C 7.5 (H) 01/07/2023     Assessment & Plan:  Subacute sinusitis, unspecified location Assessment & Plan: Treating with doxycycline.  Orders: -     Doxycycline Hyclate; Take 1 tablet (100 mg total) by mouth 2 (two) times daily.  Dispense: 14 tablet; Refill: 0    Follow-up:  Return if symptoms worsen or fail to improve.  Everlene Other DO [facility]

## 2023-12-21 DIAGNOSIS — M25512 Pain in left shoulder: Secondary | ICD-10-CM | POA: Diagnosis not present

## 2023-12-21 DIAGNOSIS — M25511 Pain in right shoulder: Secondary | ICD-10-CM | POA: Diagnosis not present

## 2023-12-21 DIAGNOSIS — F419 Anxiety disorder, unspecified: Secondary | ICD-10-CM | POA: Diagnosis not present

## 2023-12-21 DIAGNOSIS — M5412 Radiculopathy, cervical region: Secondary | ICD-10-CM | POA: Diagnosis not present

## 2023-12-21 DIAGNOSIS — G894 Chronic pain syndrome: Secondary | ICD-10-CM | POA: Diagnosis not present

## 2023-12-21 DIAGNOSIS — M25531 Pain in right wrist: Secondary | ICD-10-CM | POA: Diagnosis not present

## 2023-12-21 DIAGNOSIS — M545 Low back pain, unspecified: Secondary | ICD-10-CM | POA: Diagnosis not present

## 2023-12-21 DIAGNOSIS — M159 Polyosteoarthritis, unspecified: Secondary | ICD-10-CM | POA: Diagnosis not present

## 2023-12-21 DIAGNOSIS — Z7689 Persons encountering health services in other specified circumstances: Secondary | ICD-10-CM | POA: Diagnosis not present

## 2023-12-26 ENCOUNTER — Other Ambulatory Visit: Payer: Self-pay | Admitting: Family Medicine

## 2023-12-26 DIAGNOSIS — I10 Essential (primary) hypertension: Secondary | ICD-10-CM

## 2024-02-07 ENCOUNTER — Ambulatory Visit: Admitting: Student in an Organized Health Care Education/Training Program

## 2024-02-27 DIAGNOSIS — M25511 Pain in right shoulder: Secondary | ICD-10-CM | POA: Diagnosis not present

## 2024-02-27 DIAGNOSIS — F419 Anxiety disorder, unspecified: Secondary | ICD-10-CM | POA: Diagnosis not present

## 2024-02-27 DIAGNOSIS — M25531 Pain in right wrist: Secondary | ICD-10-CM | POA: Diagnosis not present

## 2024-02-27 DIAGNOSIS — M545 Low back pain, unspecified: Secondary | ICD-10-CM | POA: Diagnosis not present

## 2024-02-27 DIAGNOSIS — M159 Polyosteoarthritis, unspecified: Secondary | ICD-10-CM | POA: Diagnosis not present

## 2024-02-27 DIAGNOSIS — M5412 Radiculopathy, cervical region: Secondary | ICD-10-CM | POA: Diagnosis not present

## 2024-02-27 DIAGNOSIS — M25519 Pain in unspecified shoulder: Secondary | ICD-10-CM | POA: Diagnosis not present

## 2024-02-27 DIAGNOSIS — Z7689 Persons encountering health services in other specified circumstances: Secondary | ICD-10-CM | POA: Diagnosis not present

## 2024-02-27 DIAGNOSIS — G894 Chronic pain syndrome: Secondary | ICD-10-CM | POA: Diagnosis not present

## 2024-02-27 DIAGNOSIS — F410 Panic disorder [episodic paroxysmal anxiety] without agoraphobia: Secondary | ICD-10-CM | POA: Diagnosis not present

## 2024-03-23 ENCOUNTER — Other Ambulatory Visit: Payer: Self-pay | Admitting: Family Medicine

## 2024-04-10 ENCOUNTER — Ambulatory Visit (INDEPENDENT_AMBULATORY_CARE_PROVIDER_SITE_OTHER): Admitting: Family Medicine

## 2024-04-10 VITALS — BP 116/73 | HR 82 | Temp 98.2°F | Ht 69.0 in | Wt 291.0 lb

## 2024-04-10 DIAGNOSIS — I2510 Atherosclerotic heart disease of native coronary artery without angina pectoris: Secondary | ICD-10-CM

## 2024-04-10 DIAGNOSIS — N1831 Chronic kidney disease, stage 3a: Secondary | ICD-10-CM | POA: Diagnosis not present

## 2024-04-10 DIAGNOSIS — R413 Other amnesia: Secondary | ICD-10-CM

## 2024-04-10 DIAGNOSIS — R110 Nausea: Secondary | ICD-10-CM | POA: Diagnosis not present

## 2024-04-10 DIAGNOSIS — E1122 Type 2 diabetes mellitus with diabetic chronic kidney disease: Secondary | ICD-10-CM | POA: Diagnosis not present

## 2024-04-10 DIAGNOSIS — E785 Hyperlipidemia, unspecified: Secondary | ICD-10-CM | POA: Diagnosis not present

## 2024-04-10 DIAGNOSIS — G4719 Other hypersomnia: Secondary | ICD-10-CM

## 2024-04-10 DIAGNOSIS — R259 Unspecified abnormal involuntary movements: Secondary | ICD-10-CM | POA: Diagnosis not present

## 2024-04-10 DIAGNOSIS — E119 Type 2 diabetes mellitus without complications: Secondary | ICD-10-CM

## 2024-04-10 MED ORDER — ONDANSETRON 4 MG PO TBDP: 4.0000 mg | ORAL_TABLET | Freq: Three times a day (TID) | ORAL | 3 refills | Status: AC | PRN

## 2024-04-10 MED ORDER — TAMSULOSIN HCL 0.4 MG PO CAPS
0.4000 mg | ORAL_CAPSULE | Freq: Every day | ORAL | 3 refills | Status: AC
Start: 2024-04-10 — End: ?

## 2024-04-10 MED ORDER — NITROGLYCERIN 0.4 MG SL SUBL: 0.4000 mg | SUBLINGUAL_TABLET | SUBLINGUAL | 3 refills | Status: AC | PRN

## 2024-04-10 MED ORDER — ALBUTEROL SULFATE HFA 108 (90 BASE) MCG/ACT IN AERS: 2.0000 | INHALATION_SPRAY | Freq: Four times a day (QID) | RESPIRATORY_TRACT | 1 refills | Status: AC | PRN

## 2024-04-10 NOTE — Assessment & Plan Note (Addendum)
 Concern for OSA. Referring to neurology (needs sleep study). Nocturia could be playing a role as well. Treating nocturia with Flomax.

## 2024-04-10 NOTE — Assessment & Plan Note (Signed)
 Suspect that this is likely from medications (Xanax  and/or opioids). Referring to neurology.

## 2024-04-10 NOTE — Assessment & Plan Note (Signed)
Sending in Zofran

## 2024-04-10 NOTE — Progress Notes (Signed)
 Subjective:  Patient ID: Michael Barr, male    DOB: [birth year]  Age: 78 y.o. MRN: [id number]  CC:  Multiple complaints   HPI:  78 year old male with the below mentioned medical problems presents for evaluation of multiple complaints.  Patient reports a 42-month history of excessive daytime sleepiness.  Patient reports frequent nocturia which interferes with sleep.  He denies any snoring.  His weight is a significant risk factor for sleep apnea.  Has never had a sleep study.  Patient also reports intermittent nausea over the past month.  Responds to Zofran .  Patient also reports worsening memory.  He states that he is often forgetful.  For example, if he is traveling somewhere with his wife he often forgets the directions that she has given him and has to have her repeat it.  Lastly, patient reports ongoing jerking of his limbs.  He has had this previously.  He first informed me of this in 2023.  Referral was placed to neurology but he was never seen.  He had labs and imaging done and they were unremarkable.  Suspect the patient's medications are playing a role.  Patient Active Problem List   Diagnosis Date Noted   Excessive daytime sleepiness 04/10/2024   Involuntary movements 04/10/2024   Nausea 04/10/2024   Fibromyalgia 01/06/2022   Type 2 diabetes mellitus without complications (HCC) 04/15/2020   History of lumbar surgery 04/15/2020   Chronic pain syndrome 04/15/2020   Coronary artery disease 05/02/2017   Essential hypertension 05/02/2017   Hyperlipidemia LDL goal <70 05/02/2017    Social Hx   Social History   Socioeconomic History   Marital status: Married    Spouse name: Not on file   Number of children: Not on file   Years of education: Not on file   Highest education level: 12th grade  Occupational History   Not on file  Tobacco Use   Smoking status: Former   Smokeless tobacco: Never   Tobacco comments:    quit smoking early 70's  Vaping Use   Vaping  status: Never Used  Substance and Sexual Activity   Alcohol use: No   Drug use: No   Sexual activity: Not Currently  Other Topics Concern   Not on file  Social History Narrative   Not on file   Social Drivers of Health   Financial Resource Strain: Low Risk  (09/16/2023)   Overall Financial Resource Strain (CARDIA)    Difficulty of Paying Living Expenses: Not hard at all  Food Insecurity: No Food Insecurity (09/16/2023)   Hunger Vital Sign    Worried About Running Out of Food in the Last Year: Never true    Ran Out of Food in the Last Year: Never true  Transportation Needs: No Transportation Needs (09/16/2023)   PRAPARE - Administrator, Civil Service (Medical): No    Lack of Transportation (Non-Medical): No  Physical Activity: Inactive (09/16/2023)   Exercise Vital Sign    Days of Exercise per Week: 0 days    Minutes of Exercise per Session: 0 min  Stress: No Stress Concern Present (09/16/2023)   [employer] of Occupational Health - Occupational Stress Questionnaire    Feeling of Stress : Not at all  Social Connections: Socially Integrated (09/16/2023)   Social Connection and Isolation Panel    Frequency of Communication with Friends and Family: More than three times a week    Frequency of Social Gatherings with Friends and Family: Three times a week  Attends Religious Services: More than 4 times per year    Active Member of Clubs or Organizations: Yes    Attends Banker Meetings: More than 4 times per year    Marital Status: Married    Review of Systems Per HPI  Objective:  BP 116/73   Pulse 82   Temp 98.2 F (36.8 C)   Ht 5' 9 (1.753 m)   Wt 291 lb (132 kg)   SpO2 97%   BMI 42.97 kg/m      04/10/2024   11:24 AM 12/15/2023    3:45 PM 09/16/2023    5:44 PM  BP/Weight  Systolic BP 116 144 --  Diastolic BP 73 80 --  Wt. (Lbs) 291 303 295  BMI 42.97 kg/m2 44.75 kg/m2 43.56 kg/m2    Physical Exam Vitals and nursing note  reviewed.  Constitutional:      General: He is not in acute distress.    Appearance: Normal appearance. He is obese.  HENT:     Head: Normocephalic and atraumatic.   Cardiovascular:     Rate and Rhythm: Normal rate and regular rhythm.  Pulmonary:     Effort: Pulmonary effort is normal. No respiratory distress.   Neurological:     Mental Status: He is alert.     Comments: No tremor or myoclonus noted.   Psychiatric:        Mood and Affect: Mood normal.        Behavior: Behavior normal.     Lab Results  Component Value Date   WBC 8.2 01/05/2022   HGB 13.6 01/05/2022   HCT 40.6 01/05/2022   PLT 238 01/05/2022   GLUCOSE 124 (H) 01/07/2023   CHOL 138 01/07/2023   TRIG 242 (H) 01/07/2023   HDL 37 (L) 01/07/2023   LDLCALC 62 01/07/2023   ALT 22 01/07/2023   AST 23 01/07/2023   NA 142 01/07/2023   K 5.5 (H) 01/07/2023   CL 104 01/07/2023   CREATININE 1.44 (H) 01/07/2023   BUN 31 (H) 01/07/2023   CO2 21 01/07/2023   INR 0.98 09/27/2010   HGBA1C 7.5 (H) 01/07/2023     Assessment & Plan:  Excessive daytime sleepiness Assessment & Plan: Concern for OSA. Referring to neurology (needs sleep study). Nocturia could be playing a role as well. Treating nocturia with Flomax.  Orders: -     Ambulatory referral to Neurology  Involuntary movements Assessment & Plan: Suspect that this is likely from medications (Xanax  and/or opioids). Referring to neurology.  Orders: -     Ambulatory referral to Neurology -     TSH  Memory difficulties -     Ambulatory referral to Neurology  Type 2 diabetes mellitus without complication, unspecified whether long term insulin use (HCC) -     CMP14+EGFR -     Hemoglobin A1c -     Microalbumin / creatinine urine ratio  Hyperlipidemia LDL goal <70 -     Lipid panel  Stage 3a chronic kidney disease (HCC) -     CBC  Coronary artery disease involving native coronary artery of native heart without angina pectoris -     Nitroglycerin ;  Place 1 tablet (0.4 mg total) under the tongue every 5 (five) minutes as needed for chest pain.  Dispense: 30 tablet; Refill: 3  Nausea Assessment & Plan: Sending in Zofran .   Other orders -     Tamsulosin HCl; Take 1 capsule (0.4 mg total) by mouth daily.  Dispense: 90  capsule; Refill: 3 -     Ondansetron ; Take 1 tablet (4 mg total) by mouth every 8 (eight) hours as needed for nausea or vomiting.  Dispense: 20 tablet; Refill: 3 -     Albuterol  Sulfate HFA; Inhale 2 puffs into the lungs every 6 (six) hours as needed for wheezing or shortness of breath.  Dispense: 18 g; Refill: 1    Follow-up:  3 months  Arlin Sass Debrah Fan DO [facility]

## 2024-04-10 NOTE — Patient Instructions (Signed)
 Labs at your convenience.  Referral placed.  Medication as directed.  Follow up in 3 months.

## 2024-04-24 DIAGNOSIS — L573 Poikiloderma of Civatte: Secondary | ICD-10-CM | POA: Diagnosis not present

## 2024-04-24 DIAGNOSIS — L814 Other melanin hyperpigmentation: Secondary | ICD-10-CM | POA: Diagnosis not present

## 2024-04-24 DIAGNOSIS — Z85828 Personal history of other malignant neoplasm of skin: Secondary | ICD-10-CM | POA: Diagnosis not present

## 2024-04-30 NOTE — Telephone Encounter (Unsigned)
 Copied from CRM [id number]. Topic: Clinical - Medication Question >> Apr 30, 2024  3:17 PM Donna E wrote: Reason for CRM: patient calling in, he is describing a small pill, tan color, 03 on one side A on the other side, it's round the size of an aspirin .   Patient doesn't know the name of the pill, he thinks it's simvastatin  (ZOCOR ) 40 MG tablet   and needs a refill cholesterol  medication,  Patient wife Sharman, takes care of medication refill. Please call Sharman [phone] with all questions.

## 2024-05-01 DIAGNOSIS — R259 Unspecified abnormal involuntary movements: Secondary | ICD-10-CM | POA: Diagnosis not present

## 2024-05-01 DIAGNOSIS — E119 Type 2 diabetes mellitus without complications: Secondary | ICD-10-CM | POA: Diagnosis not present

## 2024-05-01 DIAGNOSIS — E785 Hyperlipidemia, unspecified: Secondary | ICD-10-CM | POA: Diagnosis not present

## 2024-05-01 DIAGNOSIS — N1831 Chronic kidney disease, stage 3a: Secondary | ICD-10-CM | POA: Diagnosis not present

## 2024-05-02 LAB — CBC
Hematocrit: 40.9 % (ref 37.5–51.0)
Hemoglobin: 13.2 g/dL (ref 13.0–17.7)
MCH: 29.7 pg (ref 26.6–33.0)
MCHC: 32.3 g/dL (ref 31.5–35.7)
MCV: 92 fL (ref 79–97)
Platelets: 237 x10E3/uL (ref 150–450)
RBC: 4.45 x10E6/uL (ref 4.14–5.80)
RDW: 12.7 % (ref 11.6–15.4)
WBC: 7.3 x10E3/uL (ref 3.4–10.8)

## 2024-05-02 LAB — CMP14+EGFR
ALT: 20 IU/L (ref 0–44)
AST: 21 IU/L (ref 0–40)
Albumin: 4.4 g/dL (ref 3.8–4.8)
Alkaline Phosphatase: 66 IU/L (ref 44–121)
BUN/Creatinine Ratio: 22 (ref 10–24)
BUN: 26 mg/dL (ref 8–27)
Bilirubin Total: 0.4 mg/dL (ref 0.0–1.2)
CO2: 20 mmol/L (ref 20–29)
Calcium: 9.2 mg/dL (ref 8.6–10.2)
Chloride: 99 mmol/L (ref 96–106)
Creatinine, Ser: 1.18 mg/dL (ref 0.76–1.27)
Globulin, Total: 2.5 g/dL (ref 1.5–4.5)
Glucose: 153 mg/dL — ABNORMAL HIGH (ref 70–99)
Potassium: 5 mmol/L (ref 3.5–5.2)
Sodium: 137 mmol/L (ref 134–144)
Total Protein: 6.9 g/dL (ref 6.0–8.5)
eGFR: 64 mL/min/1.73 (ref >59)

## 2024-05-02 LAB — LIPID PANEL
Chol/HDL Ratio: 6.4 ratio — ABNORMAL HIGH (ref 0.0–5.0)
Cholesterol, Total: 219 mg/dL — ABNORMAL HIGH (ref 100–199)
HDL: 34 mg/dL — ABNORMAL LOW (ref >39)
LDL Chol Calc (NIH): 139 mg/dL — ABNORMAL HIGH (ref 0–99)
Triglycerides: 251 mg/dL — ABNORMAL HIGH (ref 0–149)
VLDL Cholesterol Cal: 46 mg/dL — ABNORMAL HIGH (ref 5–40)

## 2024-05-02 LAB — MICROALBUMIN / CREATININE URINE RATIO
Creatinine, Urine: 99.3 mg/dL
Microalb/Creat Ratio: 80 mg/g{creat} — ABNORMAL HIGH (ref 0–29)
Microalbumin, Urine: 79.6 ug/mL

## 2024-05-02 LAB — HEMOGLOBIN A1C
Est. average glucose Bld gHb Est-mCnc: 209 mg/dL
Hgb A1c MFr Bld: 8.9 % — ABNORMAL HIGH (ref 4.8–5.6)

## 2024-05-02 LAB — TSH: TSH: 1.45 u[IU]/mL (ref 0.450–4.500)

## 2024-05-04 ENCOUNTER — Other Ambulatory Visit: Payer: Self-pay | Admitting: Family Medicine

## 2024-05-04 ENCOUNTER — Ambulatory Visit: Payer: Self-pay | Admitting: Family Medicine

## 2024-05-04 DIAGNOSIS — E785 Hyperlipidemia, unspecified: Secondary | ICD-10-CM

## 2024-05-07 ENCOUNTER — Other Ambulatory Visit: Payer: Self-pay

## 2024-05-07 ENCOUNTER — Other Ambulatory Visit: Payer: Self-pay | Admitting: Family Medicine

## 2024-05-07 DIAGNOSIS — E785 Hyperlipidemia, unspecified: Secondary | ICD-10-CM

## 2024-05-07 MED ORDER — SIMVASTATIN 40 MG PO TABS: ORAL_TABLET | ORAL | 0 refills | Status: DC

## 2024-06-08 DIAGNOSIS — E119 Type 2 diabetes mellitus without complications: Secondary | ICD-10-CM | POA: Diagnosis not present

## 2024-06-08 DIAGNOSIS — H524 Presbyopia: Secondary | ICD-10-CM | POA: Diagnosis not present

## 2024-06-08 DIAGNOSIS — H50131 Monocular exotropia with V pattern, right eye: Secondary | ICD-10-CM | POA: Diagnosis not present

## 2024-06-08 DIAGNOSIS — H2513 Age-related nuclear cataract, bilateral: Secondary | ICD-10-CM | POA: Diagnosis not present

## 2024-06-08 DIAGNOSIS — H02413 Mechanical ptosis of bilateral eyelids: Secondary | ICD-10-CM | POA: Diagnosis not present

## 2024-07-04 ENCOUNTER — Telehealth: Payer: Self-pay | Admitting: Family Medicine

## 2024-07-04 NOTE — Telephone Encounter (Signed)
 Left message for patient to call back regarding why he is taking tamsulosin .

## 2024-07-04 NOTE — Telephone Encounter (Signed)
 FYI Only or Action Required?: Action required by provider: medication refill request.  Patient was last seen in primary care on 04/10/2024 by Cook, [name], DO.  Called Nurse Triage reporting No chief complaint on file..  Symptoms began today.  Interventions attempted: Nothing.  Symptoms are: stable.  Triage Disposition: No disposition on file.  Patient/caregiver understands and will follow disposition?:

## 2024-07-04 NOTE — Telephone Encounter (Signed)
 Tried calling pt at number below , but no answer or voicemail, according to pt chart patient is taking tamsulosin  for nocturia.    Copied from CRM [id number]. Topic: Clinical - Medication Question >> Jul 04, 2024 10:25 AM Tinnie BROCKS wrote: Reason for CRM: Pt calling to see what he is taking Tamsulofin .4 mg for. Please give him a call back with diagnosis related to this med. Ey#[phone]

## 2024-07-04 NOTE — Telephone Encounter (Signed)
 Copied from CRM [id number]. Topic: Clinical - Medication Refill >> Jul 04, 2024 10:33 AM Tinnie C wrote: Medication:  tamsulosin  (FLOMAX ) 0.4 MG CAPS capsule    Has the patient contacted their pharmacy? No (Agent: If no, request that the patient contact the pharmacy for the refill. If patient does not wish to contact the pharmacy document the reason why and proceed with request.) (Agent: If yes, when and what did the pharmacy advise?)  This is the patient's preferred pharmacy:  [facility] [address] [place] - [address] [place] [address] Phone: [phone] Fax: [phone]  Is this the correct pharmacy for this prescription? Yes If no, delete pharmacy and type the correct one.   Has the prescription been filled recently? Yes  Is the patient out of the medication? No, 4 left  Has the patient been seen for an appointment in the last year OR does the patient have an upcoming appointment? Yes  Can we respond through MyChart? No, he is also wanting a call to tell him what he is taking the medication for since he has forgotten. There is a CRM request to give him a call for this as well.   Agent: Please be advised that Rx refills may take up to 3 business days. We ask that you follow-up with your pharmacy.

## 2024-07-10 DIAGNOSIS — M25531 Pain in right wrist: Secondary | ICD-10-CM | POA: Diagnosis not present

## 2024-07-10 DIAGNOSIS — Z79899 Other long term (current) drug therapy: Secondary | ICD-10-CM | POA: Diagnosis not present

## 2024-07-10 DIAGNOSIS — Z7689 Persons encountering health services in other specified circumstances: Secondary | ICD-10-CM | POA: Diagnosis not present

## 2024-07-10 DIAGNOSIS — M159 Polyosteoarthritis, unspecified: Secondary | ICD-10-CM | POA: Diagnosis not present

## 2024-07-10 DIAGNOSIS — G8929 Other chronic pain: Secondary | ICD-10-CM | POA: Diagnosis not present

## 2024-07-10 DIAGNOSIS — M25512 Pain in left shoulder: Secondary | ICD-10-CM | POA: Diagnosis not present

## 2024-07-10 DIAGNOSIS — F410 Panic disorder [episodic paroxysmal anxiety] without agoraphobia: Secondary | ICD-10-CM | POA: Diagnosis not present

## 2024-07-10 DIAGNOSIS — M545 Low back pain, unspecified: Secondary | ICD-10-CM | POA: Diagnosis not present

## 2024-07-10 DIAGNOSIS — M5412 Radiculopathy, cervical region: Secondary | ICD-10-CM | POA: Diagnosis not present

## 2024-07-10 DIAGNOSIS — F419 Anxiety disorder, unspecified: Secondary | ICD-10-CM | POA: Diagnosis not present

## 2024-07-10 DIAGNOSIS — M25511 Pain in right shoulder: Secondary | ICD-10-CM | POA: Diagnosis not present

## 2024-08-06 DIAGNOSIS — Z23 Encounter for immunization: Secondary | ICD-10-CM | POA: Diagnosis not present

## 2024-09-13 ENCOUNTER — Ambulatory Visit: Payer: Self-pay

## 2024-09-13 DIAGNOSIS — J019 Acute sinusitis, unspecified: Secondary | ICD-10-CM | POA: Diagnosis not present

## 2024-09-13 DIAGNOSIS — Z0130 Encounter for examination of blood pressure without abnormal findings: Secondary | ICD-10-CM | POA: Diagnosis not present

## 2024-09-13 NOTE — Telephone Encounter (Signed)
 FYI Only or Action Required?: Action required by provider: update on patient condition and pt needs a call back today.  Patient was last seen in primary care on 04/10/2024 by Cook, [name], DO.  Called Nurse Triage reporting Chest Pain.  Symptoms began a week ago.  Interventions attempted: OTC medications: Nasal spray, cold/flu capsules.  Symptoms are: gradually worsening.  Triage Disposition: Call EMS 911 Now  Patient/caregiver understands and will follow disposition?: No, wishes to speak with PCP         Copied from CRM #[id number]. Topic: Clinical - Red Word Triage >> Sep 13, 2024 11:27 AM Donee H wrote: [facility] that prompted transfer to Nurse Triage:  Patient states experiencing flu like symptoms for a week now. He also mentioned tightness in chest and head.He states sinus is terrible as well. Patient unable to sleep at night. No fever. Patient is wanting pcp to prescribed him something. He states do not want to come in because he is experiencing diarrhea as well. Reason for Disposition  [1] Chest pain lasts > 5 minutes AND [2] age > 33  Answer Assessment - Initial Assessment Questions This RN recommends pt goes to ED but pt refused.  Pt wants a medication sent in. This RN attempted to contact [facility] but there was no answer. Pt call back number is [phone]. Please call pt back today.   Described as flu like symptoms Onset: One week Sore throat started a couple of days ago Diarrhea started 5 days ago; none today Chest tightness intermittent like a shirt on too tight; pt states this happens every time he gets a cold; happens when pt lays down at night  From left shoulder to right shoulder CP-Lasts 1-1.5 hrs Congestion- clear/brown in color; denies blood in mucous Sneezing Headache Difficulty sleeping Denies cough, fever, difficulty breathing (except first couple of days), dizziness  Protocols used: Chest Pain-A-AH

## 2024-09-13 NOTE — Telephone Encounter (Signed)
 Call center recommends pt goes to ED but pt refused. Pt wants a medication sent in. This RN attempted to contact [facility] but there was no answer. Pt call back number is [phone]. Please call pt back today.   Described as flu like symptoms Onset: One week Sore throat started a couple of days ago Diarrhea started 5 days ago; none today Chest tightness intermittent like a shirt on too tight; pt states this happens every time he gets a cold; happens when pt lays down at night  From left shoulder to right shoulder CP-Lasts 1-1.5 hrs Congestion- clear/Michael Barr in color; denies blood in mucous Sneezing Headache Difficulty sleeping Denies cough, fever, difficulty breathing (except first couple of days), dizziness

## 2024-09-17 NOTE — Telephone Encounter (Signed)
 Cook, [name], DO     09/13/24  4:59 PM Needs to be seen

## 2024-09-17 NOTE — Telephone Encounter (Signed)
 Wife stated since they did not receive a call back with in the hour from the provider as promised - they sought care else ware and are taken care of

## 2024-10-09 DIAGNOSIS — M5412 Radiculopathy, cervical region: Secondary | ICD-10-CM | POA: Diagnosis not present

## 2024-10-09 DIAGNOSIS — Z7689 Persons encountering health services in other specified circumstances: Secondary | ICD-10-CM | POA: Diagnosis not present

## 2024-10-09 DIAGNOSIS — M25512 Pain in left shoulder: Secondary | ICD-10-CM | POA: Diagnosis not present

## 2024-10-09 DIAGNOSIS — M159 Polyosteoarthritis, unspecified: Secondary | ICD-10-CM | POA: Diagnosis not present

## 2024-10-09 DIAGNOSIS — G894 Chronic pain syndrome: Secondary | ICD-10-CM | POA: Diagnosis not present

## 2024-10-09 DIAGNOSIS — F419 Anxiety disorder, unspecified: Secondary | ICD-10-CM | POA: Diagnosis not present

## 2024-10-09 DIAGNOSIS — F410 Panic disorder [episodic paroxysmal anxiety] without agoraphobia: Secondary | ICD-10-CM | POA: Diagnosis not present

## 2024-10-09 DIAGNOSIS — Z79899 Other long term (current) drug therapy: Secondary | ICD-10-CM | POA: Diagnosis not present

## 2024-10-09 DIAGNOSIS — M25511 Pain in right shoulder: Secondary | ICD-10-CM | POA: Diagnosis not present

## 2024-10-09 DIAGNOSIS — M25531 Pain in right wrist: Secondary | ICD-10-CM | POA: Diagnosis not present

## 2024-10-26 ENCOUNTER — Ambulatory Visit
Admission: RE | Admit: 2024-10-26 | Discharge: 2024-10-26 | Disposition: A | Discharge status: Home / Self Care | Source: Ambulatory Visit

## 2024-10-26 ENCOUNTER — Other Ambulatory Visit: Payer: Self-pay

## 2024-10-26 VITALS — BP 165/82 | HR 85 | Temp 98.8°F | Resp 20

## 2024-10-26 DIAGNOSIS — J4521 Mild intermittent asthma with (acute) exacerbation: Secondary | ICD-10-CM | POA: Diagnosis not present

## 2024-10-26 DIAGNOSIS — J0100 Acute maxillary sinusitis, unspecified: Secondary | ICD-10-CM | POA: Diagnosis not present

## 2024-10-26 MED ORDER — BUDESONIDE-FORMOTEROL FUMARATE 80-4.5 MCG/ACT IN AERO: 2.0000 | INHALATION_SPRAY | Freq: Two times a day (BID) | RESPIRATORY_TRACT | 0 refills | Status: AC

## 2024-10-26 MED ORDER — AZITHROMYCIN 250 MG PO TABS: 250.0000 mg | ORAL_TABLET | Freq: Every day | ORAL | 0 refills | Status: AC

## 2024-10-26 NOTE — ED Triage Notes (Signed)
 Pt reports cough, nasal congestion for over 2 months. Pt reports was seen at other medical office midway into symptoms and px doxycycline  with no change in symptoms. Reports symptoms returned and seem to get better and then come back.

## 2024-10-26 NOTE — ED Provider Notes (Signed)
 " [facility]    CSN: [id number] Arrival date & time: 10/26/24  1342      History   Chief Complaint Chief Complaint  Patient presents with   Nasal Congestion    Entered by patient    HPI Deondra Wigger is a 79 y.o. male presenting w nasal congestion.  Pt reports cough (productive of green for the last 6 days), nasal congestion for over 2 months. Nasal congestion is thick. Pt reports was seen at other medical office midway into symptoms and px doxycycline  with no change in symptoms (I do not have access to these records). Denies current SOB, CP, dizziness, weakness; does note some DOE. Reports symptoms returned and seem to get better and then come back.  H/o asthma and allergies, for which he is prescribed albuterol  inhaler (has used 6x today) and flonase nasal spray.  States he does not use his albuterol  inhaler this frequently when he is feeling well.  Mentions he is followed by pain clinic for chronic pain and fibro.   HPI  Past Medical History:  Diagnosis Date   Anxiety    takes Xanax  and Methadone  daily   Arthritis    low back- DDD   Asthma 70's   Cataract    immature and to the left eye   Chronic back pain    HNP   Constipation    takes Sennokot nightly   Coronary artery disease    Depression    attacks   Fibromyalgia    GERD (gastroesophageal reflux disease)    20 YRS AGO, AND DIET CONTROL   History of kidney stones    Hyperlipidemia    takes Zocor  daily   Hypertension    takes Zestoretic  daily   Hypokalemia    takes Potassium daily   Insomnia    takes trazodone  nightly   Joint pain    Joint swelling    Nocturia    Pneumonia 70's   Type 2 diabetes mellitus without complications (HCC)    Weakness    right leg    Patient Active Problem List   Diagnosis Date Noted   Excessive daytime sleepiness 04/10/2024   Involuntary movements 04/10/2024   Nausea 04/10/2024   Fibromyalgia 01/06/2022   Type 2 diabetes mellitus without  complications (HCC) 04/15/2020   History of lumbar surgery 04/15/2020   Chronic pain syndrome 04/15/2020   Coronary artery disease 05/02/2017   Essential hypertension 05/02/2017   Hyperlipidemia LDL goal <70 05/02/2017    Past Surgical History:  Procedure Laterality Date   APPLICATION OF INTRAOPERATIVE CT SCAN N/A 08/21/2018   Procedure: APPLICATION OF INTRAOPERATIVE CT SCAN;  Surgeon: Onetha Kuba, MD;  Location: [facility] OR;  Service: Neurosurgery;  Laterality: N/A;   BACK SURGERY     multiple back surgeries   CARDIAC CATHETERIZATION     unsure of how long ago   CARPAL TUNNEL RELEASE Bilateral 70's   CHOLECYSTECTOMY  1985   COLONOSCOPY     COLONOSCOPY WITH PROPOFOL  N/A 07/09/2019   Procedure: COLONOSCOPY WITH PROPOFOL ;  Surgeon: Shaaron Lamar HERO, MD;  Location: [facility];  Service: Endoscopy;  Laterality: N/A;  12:15pm   CORONARY ARTERY BYPASS GRAFT  1996   1 vessel   HARDWARE REMOVAL N/A 03/26/2016   Procedure: HARDWARE REMOVAL Lumbar one-two ;  Surgeon: Kuba Onetha, MD;  Location: [facility];  Service: Neurosurgery;  Laterality: N/A;   JOINT REPLACEMENT  1999   lt knee   KNEE ARTHROSCOPY Right  LUMBAR LAMINECTOMY/DECOMPRESSION MICRODISCECTOMY Right 09/26/2013   Procedure: LUMBAR LAMINECTOMY/DECOMPRESSION MICRODISCECTOMY RIGHT THREE-FOUR;  Surgeon: Arley SHAUNNA Helling, MD;  Location: [facility];  Service: Neurosurgery;  Laterality: Right;   pilondial cyst     x 2    POLYPECTOMY  07/09/2019   Procedure: POLYPECTOMY;  Surgeon: Shaaron Lamar HERO, MD;  Location: [facility];  Service: Endoscopy;;  transverse colon polyps x 2   POSTERIOR LUMBAR FUSION 4 LEVEL N/A 08/21/2018   Procedure: Posterior Lateral Fusion Thoracic Eight to Pelvis with Iliac screws and Fenestrated screws with kyphon and AIRO;  Surgeon: Helling Arley, MD;  Location: [facility] OR;  Service: Neurosurgery;  Laterality: N/A;   SHOULDER ARTHROSCOPY Bilateral    left x 2 and right x 1   TONSILLECTOMY     tumor removed from right ankle    70's       Home Medications    Prior to Admission medications  Medication Sig Start Date End Date Taking? Authorizing Provider  azithromycin (ZITHROMAX Z-PAK) 250 MG tablet Take 1 tablet (250 mg total) by mouth daily. Two pills (500mg ) day 1. One pill per day (250mg ) days 2-5. 10/26/24  Yes Nekeshia Lenhardt E, PA-C  budesonide-formoterol [facility]) 80-4.5 MCG/ACT inhaler Inhale 2 puffs into the lungs 2 (two) times daily. 10/26/24  Yes Elisandra Deshmukh E, PA-C  oxyCODONE -acetaminophen  (PERCOCET) 10-325 MG tablet Take 1 tablet by mouth 4 (four) times daily as needed. 10/17/24  Yes [provider]  acetaminophen  (TYLENOL ) 500 MG tablet Take 500 mg by mouth 3 (three) times daily.    [provider]  albuterol  (VENTOLIN  HFA) 108 (90 Base) MCG/ACT inhaler Inhale 2 puffs into the lungs every 6 (six) hours as needed for wheezing or shortness of breath. 04/10/24   Cook, [name], DO  ALPRAZolam  (XANAX ) 0.25 MG tablet Take 0.25 mg by mouth 3 (three) times daily. 04/15/19   [provider]  aspirin  EC 325 MG tablet Take 325 mg by mouth daily.    [provider]  fluticasone (FLONASE) 50 MCG/ACT nasal spray Place into both nostrils daily.    [provider]  gabapentin  (NEURONTIN ) 300 MG capsule Take 300 mg by mouth See admin instructions. Takes 1 capsule 3 x per day and 2 caps at bedtime    [provider]  lisinopril -hydrochlorothiazide  (ZESTORETIC ) 20-25 MG tablet TAKE 1 TABLET BY MOUTH EVERY DAY IN THE MORNING 12/27/23   Cook, [name], DO  metFORMIN  (GLUCOPHAGE ) 500 MG tablet TAKE 1 TABLET BY MOUTH TWICE DAILY WITH A MEAL 03/23/24   Cook, [name], DO  morphine  (MS CONTIN ) 15 MG 12 hr tablet Take 15 mg by mouth 3 (three) times daily. 12/01/21   [provider]  Multiple Vitamins-Minerals (MULTIVITAMIN WITH MINERALS) tablet Take 1 tablet by mouth daily.    [provider]  nitroGLYCERIN  (NITROSTAT ) 0.4 MG SL tablet Place 1 tablet (0.4 mg total)  under the tongue every 5 (five) minutes as needed for chest pain. 04/10/24   Cook, [name], DO  ondansetron  (ZOFRAN -ODT) 4 MG disintegrating tablet Take 1 tablet (4 mg total) by mouth every 8 (eight) hours as needed for nausea or vomiting. 04/10/24   Cook, [name], DO  Oxycodone  HCl 10 MG TABS Take 10 mg by mouth 3 (three) times daily. Taking 15 mg 4 x per day Patient taking differently: Take 15 mg by mouth in the morning, at noon, in the evening, and at bedtime. Taking 15 mg 4 x per day  [provider]  senna (SENOKOT) 8.6 MG TABS tablet Take 1-2 tablets by mouth daily as needed (constipation.).     [provider]  simvastatin  (ZOCOR ) 40 MG tablet Take 1 tablet by mouth at bedtime 05/07/24   Cook, [name], DO  tamsulosin  (FLOMAX ) 0.4 MG CAPS capsule Take 1 capsule (0.4 mg total) by mouth daily. 04/10/24   Cook, [name], DO  traZODone  (DESYREL ) 100 MG tablet Take 250 mg by mouth at bedtime.     [provider]    Family History Family History  Problem Relation Age of Onset   Colon cancer Neg Hx     Social History Social History[1]   Allergies   Penicillins   Review of Systems Review of Systems  Constitutional:  Negative for appetite change, chills and fever.  HENT:  Positive for congestion and sinus pressure. Negative for ear pain, rhinorrhea, sinus pain and sore throat.   Eyes:  Negative for redness and visual disturbance.  Respiratory:  Positive for cough. Negative for chest tightness, shortness of breath and wheezing.   Cardiovascular:  Negative for chest pain and palpitations.  Gastrointestinal:  Negative for abdominal pain, constipation, diarrhea, nausea and vomiting.  Genitourinary:  Negative for dysuria, frequency and urgency.  Musculoskeletal:  Negative for myalgias.  Neurological:  Negative for dizziness, weakness and headaches.  Psychiatric/Behavioral:  Negative for confusion.   All other systems reviewed and are negative.    Physical  Exam Triage Vital Signs ED Triage Vitals  Encounter Vitals Group     BP 10/26/24 1412 (!) 165/82     Girls Systolic BP Percentile --      Girls Diastolic BP Percentile --      Boys Systolic BP Percentile --      Boys Diastolic BP Percentile --      Pulse Rate 10/26/24 1412 85     Resp 10/26/24 1412 20     Temp 10/26/24 1412 98.8 F (37.1 C)     Temp Source 10/26/24 1412 Oral     SpO2 10/26/24 1412 92 %     Weight --      Height --      Head Circumference --      Peak Flow --      Pain Score 10/26/24 1409 3     Pain Loc --      Pain Education --      Exclude from Growth Chart --    No data found.  Updated Vital Signs BP (!) 165/82 (BP Location: Right Arm)   Pulse 85   Temp 98.8 F (37.1 C) (Oral)   Resp 20   SpO2 92%   Visual Acuity Right Eye Distance:   Left Eye Distance:   Bilateral Distance:    Right Eye Near:   Left Eye Near:    Bilateral Near:     Physical Exam Vitals reviewed.  Constitutional:      General: He is not in acute distress.    Appearance: Normal appearance. He is not ill-appearing.  HENT:     Head: Normocephalic and atraumatic.     Right Ear: Tympanic membrane, ear canal and external ear normal. No tenderness. No middle ear effusion. There is no impacted cerumen. Tympanic membrane is not perforated, erythematous, retracted or bulging.     Left Ear: Tympanic membrane, ear canal and external ear normal. No tenderness.  No middle ear effusion. There is no impacted cerumen. Tympanic membrane is not perforated, erythematous, retracted or bulging.  Nose: Nose normal. No congestion.     Mouth/Throat:     Mouth: Mucous membranes are moist.     Pharynx: Uvula midline. No oropharyngeal exudate or posterior oropharyngeal erythema.     Tonsils: No tonsillar exudate.  Eyes:     Extraocular Movements: Extraocular movements intact.     Pupils: Pupils are equal, round, and reactive to light.  Cardiovascular:     Rate and Rhythm: Normal rate and regular  rhythm.     Heart sounds: Normal heart sounds.  Pulmonary:     Effort: Pulmonary effort is normal.     Breath sounds: Normal breath sounds. No decreased breath sounds, wheezing, rhonchi or rales.  Abdominal:     Palpations: Abdomen is soft.     Tenderness: There is no abdominal tenderness. There is no guarding or rebound.  Lymphadenopathy:     Cervical: No cervical adenopathy.     Right cervical: No superficial, deep or posterior cervical adenopathy.    Left cervical: No superficial, deep or posterior cervical adenopathy.  Skin:    Comments: No rash   Neurological:     General: No focal deficit present.     Mental Status: He is alert and oriented to person, place, and time.  Psychiatric:        Mood and Affect: Mood normal.        Behavior: Behavior normal.        Thought Content: Thought content normal.        Judgment: Judgment normal.      UC Treatments / Results  Labs (all labs ordered are listed, but only abnormal results are displayed) Labs Reviewed - No data to display  EKG   Radiology No results found.  Procedures Procedures (including critical care time)  Medications Ordered in UC Medications - No data to display  Initial Impression / Assessment and Plan / UC Course  I have reviewed the triage vital signs and the nursing notes.  Pertinent labs & imaging results that were available during my care of the patient were reviewed by me and considered in my medical decision making (see chart for details).     Patient is a pleasant 79 y.o. male presenting with sinusitis and asthma exacerbation. The patient is afebrile and nontachycardic.  Antipyretic has not been administered today.   A1c 8.9 on 04/2024 CMP.   Did not check a COVID or influenza test due to duration of symptoms  Penicillin allergic. Has already taken doxycycline  for these symptoms about 1 month ago.  Azithromycin sent today.  Also with asthma exacerbation.  Considering elevated A1c, Symbicort  sent, rather than prednisone  by mouth.  Return precautions as below  -Coding Level 4 for acute exacerbation of chronic condition and prescription drug management.   Final Clinical Impressions(s) / UC Diagnoses   Final diagnoses:  Acute non-recurrent maxillary sinusitis  Mild intermittent asthma with (acute) exacerbation     Discharge Instructions      -Z-pack antibiotic x5 days for sinus infection -Continue Flonase nasal spray twice daily -Albuterol  inhaler that you have at home already as needed for cough, wheezing, shortness of breath, 1 to 2 puffs every 6 hours as needed. -Start the Symbicort inhaler, twice daily while symptoms persist. Gargle and spit water after.  -Your cough should slowly get better instead of worse. If you develop a cough productive of dark or red sputum, new shortness of breath, new chest tightness, new fevers, etc - seek additional care.  ED Prescriptions     Medication Sig Dispense Auth. Provider   azithromycin (ZITHROMAX Z-PAK) 250 MG tablet Take 1 tablet (250 mg total) by mouth daily. Two pills (500mg ) day 1. One pill per day (250mg ) days 2-5. 6 tablet Ritha Sampedro E, PA-C   budesonide-formoterol [facility]) 80-4.5 MCG/ACT inhaler Inhale 2 puffs into the lungs 2 (two) times daily. 1 each Arlyss Leita BRAVO, PA-C      PDMP not reviewed this encounter.     [1]  Social History Tobacco Use   Smoking status: Former   Smokeless tobacco: Never   Tobacco comments:    quit smoking early 70's  Vaping Use   Vaping status: Never Used  Substance Use Topics   Alcohol use: No   Drug use: No     Arlyss Leita BRAVO, PA-C 10/26/24 1443  "

## 2024-10-26 NOTE — Discharge Instructions (Signed)
-  Z-pack antibiotic x5 days for sinus infection -Continue Flonase nasal spray twice daily -Albuterol  inhaler that you have at home already as needed for cough, wheezing, shortness of breath, 1 to 2 puffs every 6 hours as needed. -Start the Symbicort inhaler, twice daily while symptoms persist. Gargle and spit water after.  -Your cough should slowly get better instead of worse. If you develop a cough productive of dark or red sputum, new shortness of breath, new chest tightness, new fevers, etc - seek additional care.

## 2024-11-01 ENCOUNTER — Ambulatory Visit

## 2024-11-04 ENCOUNTER — Other Ambulatory Visit: Payer: Self-pay | Admitting: Family Medicine

## 2024-11-04 DIAGNOSIS — I10 Essential (primary) hypertension: Secondary | ICD-10-CM

## 2024-11-04 DIAGNOSIS — E785 Hyperlipidemia, unspecified: Secondary | ICD-10-CM

## 2024-11-07 ENCOUNTER — Ambulatory Visit: Payer: Self-pay

## 2024-11-07 NOTE — Telephone Encounter (Signed)
 Patient has an appointment 11/08/24

## 2024-11-07 NOTE — Telephone Encounter (Signed)
 FYI Only or Action Required?: FYI only for provider: appointment scheduled on 11/08/2024.  Patient was last seen in primary care on 04/10/2024 by [name], [name], DO.  Called Nurse Triage reporting Leg Swelling.  Symptoms began a week ago.  Interventions attempted: Prescription medications: Lisinopril -HCTZ.  Symptoms are: stable.  Triage Disposition: See PCP When Office is Open (Within 3 Days)  Patient/caregiver understands and will follow disposition?: Yes  Copied from CRM #[id number]. Topic: Clinical - Red Word Triage >> Nov 07, 2024 10:28 AM Michael Barr wrote: Red Word that prompted transfer to Nurse Triage: feet, ankle and some of his legs are swelling, at night when lying down feels like its hard to get a breath of air  Lisiniopril-hydrochlorothiazide  and simvistatin without both pills  Reason for Disposition  [1] MILD swelling of both ankles (i.e., pedal edema) AND [2] new-onset or getting worse  Answer Assessment - Initial Assessment Questions Didn't realize he had run out of his lisinopril -hydrochlorothiazide  and simvastatin  and for about 1 week. Has been taking pills for 2 days now. Wasn't urinating like normal, now is getting better but is not quite normal Can not lay down flat because he feels like he can't breathe. Patient does not think the swelling has gone down much at all. Mostly in feet and ankles, says swelling stops above his ankle. He been decreasing his salt use. Patient states he has also been blowing out a lot from his nose. Using a nasal decongestant.  1. ONSET: When did the swelling start? (e.g., minutes, hours, days)     1 week ago or less 2. LOCATION: What part of the leg is swollen?  Are both legs swollen or just one leg?     Left one is swollen worse than right. 4. REDNESS: Is there redness or signs of infection?     Denies 5. PAIN: Is the swelling painful to touch? If Yes, ask: How painful is it?   (Scale 1-10; mild, moderate or severe)     No. Says  he has pain all over all the time. 6. FEVER: Do you have a fever? If Yes, ask: What is it, how was it measured, and when did it start?      Denies 7. CAUSE: What do you think is causing the leg swelling?     *No Answer* 8. MEDICAL HISTORY: Do you have a history of blood clots (e.g., DVT), cancer, heart failure, kidney disease, or liver failure?     *No Answer* 9. RECURRENT SYMPTOM: Have you had leg swelling before? If Yes, ask: When was the last time? What happened that time?     *No Answer* 10. OTHER SYMPTOMS: Do you have any other symptoms? (e.g., chest pain, difficulty breathing)       Some difficulty breathing if he lays down flat, he has been sleeping sitting up in a chair.  Protocols used: Leg Swelling and Edema-A-AH

## 2024-11-08 ENCOUNTER — Other Ambulatory Visit: Payer: Self-pay

## 2024-11-08 ENCOUNTER — Ambulatory Visit: Admitting: Family Medicine

## 2024-11-08 ENCOUNTER — Emergency Department (HOSPITAL_COMMUNITY)
Admission: EM | Admit: 2024-11-08 | Discharge: 2024-11-08 | Disposition: A | Discharge status: Home / Self Care | Source: Ambulatory Visit | Attending: Emergency Medicine | Admitting: Emergency Medicine

## 2024-11-08 ENCOUNTER — Encounter (HOSPITAL_COMMUNITY): Payer: Self-pay

## 2024-11-08 ENCOUNTER — Ambulatory Visit (HOSPITAL_COMMUNITY)
Admission: RE | Admit: 2024-11-08 | Discharge: 2024-11-08 | Disposition: A | Source: Ambulatory Visit | Attending: Family Medicine | Admitting: Family Medicine

## 2024-11-08 ENCOUNTER — Other Ambulatory Visit (HOSPITAL_COMMUNITY)
Admission: RE | Admit: 2024-11-08 | Discharge: 2024-11-08 | Disposition: A | Source: Ambulatory Visit | Attending: Family Medicine | Admitting: Family Medicine

## 2024-11-08 ENCOUNTER — Encounter: Payer: Self-pay | Admitting: Family Medicine

## 2024-11-08 ENCOUNTER — Emergency Department (HOSPITAL_COMMUNITY)

## 2024-11-08 VITALS — BP 142/80 | Ht 69.0 in

## 2024-11-08 DIAGNOSIS — R0601 Orthopnea: Secondary | ICD-10-CM | POA: Diagnosis not present

## 2024-11-08 DIAGNOSIS — Z79899 Other long term (current) drug therapy: Secondary | ICD-10-CM | POA: Diagnosis not present

## 2024-11-08 DIAGNOSIS — R600 Localized edema: Secondary | ICD-10-CM | POA: Insufficient documentation

## 2024-11-08 DIAGNOSIS — R0602 Shortness of breath: Secondary | ICD-10-CM | POA: Diagnosis not present

## 2024-11-08 DIAGNOSIS — S20212A Contusion of left front wall of thorax, initial encounter: Secondary | ICD-10-CM | POA: Insufficient documentation

## 2024-11-08 DIAGNOSIS — I10 Essential (primary) hypertension: Secondary | ICD-10-CM | POA: Diagnosis not present

## 2024-11-08 DIAGNOSIS — M7989 Other specified soft tissue disorders: Secondary | ICD-10-CM | POA: Diagnosis not present

## 2024-11-08 DIAGNOSIS — X58XXXA Exposure to other specified factors, initial encounter: Secondary | ICD-10-CM | POA: Insufficient documentation

## 2024-11-08 DIAGNOSIS — Z7982 Long term (current) use of aspirin: Secondary | ICD-10-CM | POA: Insufficient documentation

## 2024-11-08 DIAGNOSIS — Z955 Presence of coronary angioplasty implant and graft: Secondary | ICD-10-CM | POA: Diagnosis not present

## 2024-11-08 DIAGNOSIS — S299XXA Unspecified injury of thorax, initial encounter: Secondary | ICD-10-CM | POA: Diagnosis present

## 2024-11-08 LAB — CBC
HCT: 38.9 % — ABNORMAL LOW (ref 39.0–52.0)
Hemoglobin: 12.3 g/dL — ABNORMAL LOW (ref 13.0–17.0)
MCH: 28.7 pg (ref 26.0–34.0)
MCHC: 31.6 g/dL (ref 30.0–36.0)
MCV: 90.7 fL (ref 80.0–100.0)
Platelets: 268 K/uL (ref 150–400)
RBC: 4.29 MIL/uL (ref 4.22–5.81)
RDW: 14 % (ref 11.5–15.5)
WBC: 12.6 K/uL — ABNORMAL HIGH (ref 4.0–10.5)
nRBC: 0 % (ref 0.0–0.2)

## 2024-11-08 LAB — COMPREHENSIVE METABOLIC PANEL WITH GFR
ALT: 31 U/L (ref 0–44)
AST: 31 U/L (ref 15–41)
Albumin: 4.3 g/dL (ref 3.5–5.0)
Alkaline Phosphatase: 51 U/L (ref 38–126)
Anion gap: 16 — ABNORMAL HIGH (ref 5–15)
BUN: 21 mg/dL (ref 8–23)
CO2: 21 mmol/L — ABNORMAL LOW (ref 22–32)
Calcium: 9.6 mg/dL (ref 8.9–10.3)
Chloride: 102 mmol/L (ref 98–111)
Creatinine, Ser: 1.14 mg/dL (ref 0.61–1.24)
GFR, Estimated: >60 mL/min
Glucose, Bld: 167 mg/dL — ABNORMAL HIGH (ref 70–99)
Potassium: 4.2 mmol/L (ref 3.5–5.1)
Sodium: 139 mmol/L (ref 135–145)
Total Bilirubin: 0.6 mg/dL (ref 0.0–1.2)
Total Protein: 7.4 g/dL (ref 6.5–8.1)

## 2024-11-08 LAB — PRO BRAIN NATRIURETIC PEPTIDE: Pro Brain Natriuretic Peptide: 640 pg/mL — ABNORMAL HIGH

## 2024-11-08 MED ORDER — FUROSEMIDE 10 MG/ML IJ SOLN
20.0000 mg | Freq: Once | INTRAMUSCULAR | Status: AC
  Administered 2024-11-08: 20 mg via INTRAVENOUS
  Filled 2024-11-08: qty 2

## 2024-11-08 MED ORDER — HYDROCODONE-ACETAMINOPHEN 5-325 MG PO TABS
1.0000 | ORAL_TABLET | Freq: Once | ORAL | Status: AC
  Administered 2024-11-08: 1 via ORAL
  Filled 2024-11-08: qty 1

## 2024-11-08 NOTE — ED Triage Notes (Signed)
 Pt reports he saw his PCP today for shortness of breath and he had outpatient lab work and CXR at this facility and doctor told him to come to the ER to check for CHF because his BNP was 649 and he was having shortness of breath, and CXR was normal.

## 2024-11-08 NOTE — Patient Instructions (Addendum)
 Lab and xray at the hospital.  I will call with the results.

## 2024-11-08 NOTE — Progress Notes (Signed)
 "  Subjective:  Patient ID: Michael Barr, male    DOB: Nov 13, 1945  Age: 79 y.o. MRN: [id number]  CC: Shortness of breath   HPI:  79 year old male with coronary disease status post CABG presents with shortness of breath.  Patient reports a 1 week history of shortness of breath.  He reports that when he lays flat he has significant shortness of breath.  He also has shortness of breath with exertion.  He states that he has had a recent cold and was also treated with antibiotics.  He also reports increasing lower extremity edema.  He states that he has not been taking his medication as he normally does and has had improvement in his edema since being compliant but he still has quite significant edema.  No reports of chest pain currently.  Patient Active Problem List   Diagnosis Date Noted   Orthopnea 11/08/2024   Excessive daytime sleepiness 04/10/2024   Involuntary movements 04/10/2024   Nausea 04/10/2024   Fibromyalgia 01/06/2022   Type 2 diabetes mellitus without complications (HCC) 04/15/2020   History of lumbar surgery 04/15/2020   Chronic pain syndrome 04/15/2020   Coronary artery disease 05/02/2017   Essential hypertension 05/02/2017   Hyperlipidemia LDL goal <70 05/02/2017    Social Hx   Social History   Socioeconomic History   Marital status: Married    Spouse name: Not on file   Number of children: Not on file   Years of education: Not on file   Highest education level: 12th grade  Occupational History   Not on file  Tobacco Use   Smoking status: Former   Smokeless tobacco: Never   Tobacco comments:    quit smoking early 70's  Vaping Use   Vaping status: Never Used  Substance and Sexual Activity   Alcohol use: No   Drug use: No   Sexual activity: Not Currently  Other Topics Concern   Not on file  Social History Narrative   Not on file   Social Drivers of Health   Tobacco Use: Medium Risk (11/08/2024)   Patient History    Smoking Tobacco Use: Former     Smokeless Tobacco Use: Never    Passive Exposure: Not on Actuary Strain: Low Risk (09/16/2023)   Overall Financial Resource Strain (CARDIA)    Difficulty of Paying Living Expenses: Not hard at all  Food Insecurity: No Food Insecurity (09/16/2023)   Hunger Vital Sign    Worried About Running Out of Food in the Last Year: Never true    Ran Out of Food in the Last Year: Never true  Transportation Needs: No Transportation Needs (09/16/2023)   PRAPARE - Administrator, Civil Service (Medical): No    Lack of Transportation (Non-Medical): No  Physical Activity: Inactive (09/16/2023)   Exercise Vital Sign    Days of Exercise per Week: 0 days    Minutes of Exercise per Session: 0 min  Stress: No Stress Concern Present (09/16/2023)   [employer] of Occupational Health - Occupational Stress Questionnaire    Feeling of Stress : Not at all  Social Connections: Socially Integrated (09/16/2023)   Social Connection and Isolation Panel    Frequency of Communication with Friends and Family: More than three times a week    Frequency of Social Gatherings with Friends and Family: Three times a week    Attends Religious Services: More than 4 times per year    Active Member of Clubs or Organizations: Yes  Attends Banker Meetings: More than 4 times per year    Marital Status: Married  Depression (PHQ2-9): Medium Risk (04/10/2024)   Depression (PHQ2-9)    PHQ-2 Score: 9  Alcohol Screen: Low Risk (09/16/2023)   Alcohol Screen    Last Alcohol Screening Score (AUDIT): 0  Housing: Low Risk (09/16/2023)   Housing    Last Housing Risk Score: 0  Utilities: Not At Risk (09/16/2023)   AHC Utilities    Threatened with loss of utilities: No  Health Literacy: Adequate Health Literacy (09/16/2023)   B1300 Health Literacy    Frequency of need for help with medical instructions: Never    Review of Systems Per HPI  Objective:  BP (!) 142/80   Ht 5' 9 (1.753  m)   BMI 42.97 kg/m      11/08/2024   11:58 AM 10/26/2024    2:12 PM 04/10/2024   11:24 AM  BP/Weight  Systolic BP 142 165 116  Diastolic BP 80 82 73  Wt. (Lbs)   291  BMI   42.97 kg/m2    Physical Exam Vitals and nursing note reviewed.  Constitutional:      Appearance: He is obese.  HENT:     Head: Normocephalic and atraumatic.  Eyes:     General:        Right eye: No discharge.        Left eye: No discharge.     Conjunctiva/sclera: Conjunctivae normal.  Cardiovascular:     Rate and Rhythm: Normal rate and regular rhythm.     Comments: 2+ pitting lower extremity edema bilaterally. Pulmonary:     Effort: Pulmonary effort is normal. No respiratory distress.     Breath sounds: No wheezing.  Neurological:     Mental Status: He is alert.  Psychiatric:        Mood and Affect: Mood normal.        Behavior: Behavior normal.     Lab Results  Component Value Date   WBC 12.6 (H) 11/08/2024   HGB 12.3 (L) 11/08/2024   HCT 38.9 (L) 11/08/2024   PLT 268 11/08/2024   GLUCOSE 167 (H) 11/08/2024   CHOL 219 (H) 05/01/2024   TRIG 251 (H) 05/01/2024   HDL 34 (L) 05/01/2024   LDLCALC 139 (H) 05/01/2024   ALT 31 11/08/2024   AST 31 11/08/2024   NA 139 11/08/2024   K 4.2 11/08/2024   CL 102 11/08/2024   CREATININE 1.14 11/08/2024   BUN 21 11/08/2024   CO2 21 (L) 11/08/2024   TSH 1.450 05/01/2024   INR 0.98 09/27/2010   HGBA1C 8.9 (H) 05/01/2024     Assessment & Plan:  Orthopnea Assessment & Plan: Clinical history consistent with congestive heart failure.  Chest x-ray with cardiomegaly and proBNP elevated.  Patient experiencing significant dyspnea when he went to get his labs drawn and chest x-ray at the hospital.  Patient came back to the office for results.  Patient endorsed that he had a fall after he left the hospital as well.  I have advised the patient that I recommend evaluation in the hospital for concern for congestive heart failure.  He is going to be transported  by our staff to the hospital.  Orders: -     DG Chest 2 View -     Brain natriuretic peptide -     CBC -     Comprehensive metabolic panel with GFR -     EKG 12-Lead  Jacqulyn Ahle DO [facility] "
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
## 2024-11-08 NOTE — Assessment & Plan Note (Addendum)
 Clinical history consistent with congestive heart failure.  Chest x-ray with cardiomegaly and proBNP elevated.  Patient experiencing significant dyspnea when he went to get his labs drawn and chest x-ray at the hospital.  Patient came back to the office for results.  Patient endorsed that he had a fall after he left the hospital as well.  I have advised the patient that I recommend evaluation in the hospital for concern for congestive heart failure.  He is going to be transported by our staff to the hospital.  I believe that the patient experiencing an urgent/emergent condition and will likely need hospitalization.

## 2024-11-08 NOTE — ED Provider Notes (Signed)
 " [facility] AT [facility] Provider Note   CSN: [id number] Arrival date & time: 11/08/24  1450     Patient presents with: Shortness of Breath   Michael Barr is a 79 y.o. male.  He presents to the ER with lower extremity swelling for the past several days and shortness of breath but is worse when he lays flat, follow-up with his PCP today who ordered outpatient chest x-ray, EKG and labs including CBC, CMP and proBNP.  He was told to come to hospital for evaluation for possible admission for fluid overload.  Takes lisinopril  HCTZ and has been out for 5 days    Shortness of Breath      Prior to Admission medications  Medication Sig Start Date End Date Taking? Authorizing Provider  acetaminophen  (TYLENOL ) 500 MG tablet Take 500 mg by mouth 3 (three) times daily.    [provider]  albuterol  (VENTOLIN  HFA) 108 (90 Base) MCG/ACT inhaler Inhale 2 puffs into the lungs every 6 (six) hours as needed for wheezing or shortness of breath. 04/10/24   Cook, [name], DO  ALPRAZolam  (XANAX ) 0.25 MG tablet Take 0.25 mg by mouth 3 (three) times daily. 04/15/19   [provider]  aspirin  EC 325 MG tablet Take 325 mg by mouth daily.    [provider]  azithromycin  (ZITHROMAX  Z-PAK) 250 MG tablet Take 1 tablet (250 mg total) by mouth daily. Two pills (500mg ) day 1. One pill per day (250mg ) days 2-5. 10/26/24   Graham, Laura E, PA-C  budesonide -formoterol  (SYMBICORT ) 80-4.5 MCG/ACT inhaler Inhale 2 puffs into the lungs 2 (two) times daily. 10/26/24   Graham, Laura E, PA-C  fluticasone (FLONASE) 50 MCG/ACT nasal spray Place into both nostrils daily.    [provider]  gabapentin  (NEURONTIN ) 300 MG capsule Take 300 mg by mouth See admin instructions. Takes 1 capsule 3 x per day and 2 caps at bedtime    [provider]  lisinopril -hydrochlorothiazide  (ZESTORETIC ) 20-25 MG tablet TAKE 1 TABLET BY MOUTH ONCE DAILY IN THE MORNING 11/05/24    Cook, [name], DO  metFORMIN  (GLUCOPHAGE ) 500 MG tablet TAKE 1 TABLET BY MOUTH TWICE DAILY WITH A MEAL 03/23/24   Cook, [name], DO  morphine  (MS CONTIN ) 15 MG 12 hr tablet Take 15 mg by mouth 3 (three) times daily. 12/01/21   [provider]  Multiple Vitamins-Minerals (MULTIVITAMIN WITH MINERALS) tablet Take 1 tablet by mouth daily.    [provider]  nitroGLYCERIN  (NITROSTAT ) 0.4 MG SL tablet Place 1 tablet (0.4 mg total) under the tongue every 5 (five) minutes as needed for chest pain. 04/10/24   Cook, [name], DO  ondansetron  (ZOFRAN -ODT) 4 MG disintegrating tablet Take 1 tablet (4 mg total) by mouth every 8 (eight) hours as needed for nausea or vomiting. 04/10/24   Cook, [name], DO  Oxycodone  HCl 10 MG TABS Take 10 mg by mouth 3 (three) times daily. Taking 15 mg 4 x per day Patient taking differently: Take 15 mg by mouth in the morning, at noon, in the evening, and at bedtime. Taking 15 mg 4 x per day    [provider]  oxyCODONE -acetaminophen  (PERCOCET) 10-325 MG tablet Take 1 tablet by mouth 4 (four) times daily as needed. 10/17/24   [provider]  senna (SENOKOT) 8.6 MG TABS tablet Take 1-2 tablets by mouth daily as needed (constipation.).     [provider]  simvastatin  (ZOCOR ) 40 MG tablet TAKE  1 TABLET BY MOUTH AT BEDTIME 11/05/24   Cook, [name], DO  tamsulosin  (FLOMAX ) 0.4 MG CAPS capsule Take 1 capsule (0.4 mg total) by mouth daily. 04/10/24   Cook, [name], DO  traZODone  (DESYREL ) 100 MG tablet Take 250 mg by mouth at bedtime.     [provider]    Allergies: Penicillins    Review of Systems  Respiratory:  Positive for shortness of breath.     Updated Vital Signs BP (!) 142/86 (BP Location: Right Arm)   Pulse 66   Temp 98.2 F (36.8 C) (Oral)   Resp 19   Wt 136.1 kg   SpO2 97%   BMI 44.30 kg/m   Physical Exam Vitals and nursing note reviewed.  Constitutional:      General: He is not in acute distress.     Appearance: He is well-developed.  HENT:     Head: Normocephalic and atraumatic.  Eyes:     Conjunctiva/sclera: Conjunctivae normal.  Cardiovascular:     Rate and Rhythm: Normal rate and regular rhythm.     Heart sounds: No murmur heard. Pulmonary:     Effort: Pulmonary effort is normal. No respiratory distress.     Breath sounds: Normal breath sounds. No wheezing, rhonchi or rales.  Chest:     Chest wall: Tenderness present. No deformity, swelling, crepitus or edema.    Abdominal:     Palpations: Abdomen is soft.     Tenderness: There is no abdominal tenderness.  Musculoskeletal:        General: No swelling. Normal range of motion.     Cervical back: Neck supple.     Right lower leg: No tenderness. Edema present.     Left lower leg: No tenderness. Edema present.     Comments: Mild edema to level of knees bilaterally  Skin:    General: Skin is warm and dry.     Capillary Refill: Capillary refill takes less than 2 seconds.  Neurological:     Mental Status: He is alert.  Psychiatric:        Mood and Affect: Mood normal.     (all labs ordered are listed, but only abnormal results are displayed) Labs Reviewed - No data to display  EKG: None  Radiology: CT Chest Wo Contrast Result Date: 11/08/2024 EXAM: CT CHEST WITHOUT CONTRAST 11/08/2024 07:45:53 PM TECHNIQUE: CT of the chest was performed without the administration of intravenous contrast. Multiplanar reformatted images are provided for review. Automated exposure control, iterative reconstruction, and/or weight based adjustment of the mA/kV was utilized to reduce the radiation dose to as low as reasonably achievable. COMPARISON: None available. CLINICAL HISTORY: Chest trauma, blunt. FINDINGS: MEDIASTINUM: Coronary artery bypass grafting has been performed. Borderline cardiomegaly. The central pulmonary arteries are enlarged in keeping with changes of pulmonary arterial hypertension. Mild atherosclerotic calcification within  the thoracic aorta. The central airways are clear. LYMPH NODES: No mediastinal, hilar or axillary lymphadenopathy. LUNGS AND PLEURA: No focal consolidation or pulmonary edema. No pleural effusion or pneumothorax. SOFT TISSUES/BONES: Thoracolumbar fusion with instrumentation extending from T9 inferiorly is incompletely visualized on this examination. Osseous structures are age appropriate. No acute bone abnormality. No lytic or blastic bone lesion. UPPER ABDOMEN: Limited images of the upper abdomen demonstrates no acute abnormality. IMPRESSION: 1. No acute abnormality related to blunt chest trauma. 2. Borderline cardiomegaly and enlarged central pulmonary arteries, consistent with pulmonary arterial hypertension. 3. Status post coronary artery bypass grafting. 4. Mild atherosclerotic calcification within the thoracic aorta.  5. Partially visualized thoracolumbar fusion hardware. Electronically signed by: Dorethia Molt MD 11/08/2024 07:56 PM EST RP Workstation: [id number]   DG Chest 2 View Result Date: 11/08/2024 EXAM: 2 VIEW(S) XRAY OF THE CHEST 11/08/2024 01:23:00 PM COMPARISON: 06/26/2023. CLINICAL HISTORY: SOB/orthopnea SOB/orthopnea SOB/orthopnea FINDINGS: LUNGS AND PLEURA: No focal pulmonary opacity. No pleural effusion. No pneumothorax. HEART AND MEDIASTINUM: Cardiomegaly. BONES AND SOFT TISSUES: Median sternotomy noted. Posterior fixation of lower thoracic spine. Multilevel thoracic degenerative changes. IMPRESSION: 1. No acute cardiopulmonary abnormality. 2. Cardiomegaly. Electronically signed by: Waddell Calk MD 11/08/2024 01:53 PM EST RP Workstation: [id number]     Procedures   Medications Ordered in the ED  HYDROcodone -acetaminophen  (NORCO/VICODIN) 5-325 MG per tablet 1 tablet (1 tablet Oral Given 11/08/24 1928)  furosemide  (LASIX ) injection 20 mg (20 mg Intravenous Given 11/08/24 1929)    Clinical Course as of 11/08/24 2352  Thu Nov 08, 2024  2346 Sent in by PCP for concern for fluid  overload and orthopnea, patient does not have a history of CHF, he is on lisinopril  HCTZ and has been out for 5 days and attributes this to his increased swelling.  He had outpatient labs with his PCP and outpatient checks x-ray that showed no pulmonary edema or infiltrate, proBNP was not elevated for patient's age group, not consistent with acute heart failure.  He has some mild symmetric lower extremity pitting edema to the level of the knees.  I discussed with patient per his findings I do not have reason to admit him at this time but I think it would be prudent for him to follow-up with cardiology as he has not had an echocardiogram per our system and does not currently follow-up with cardiology.  Referral was placed, he is given a dose of IV Lasix  here in had significant urine output and is feeling much better, oxygen is 97% on room air.  His HCTZ has been sent back to his pharmacy and he was encouraged to pick this up and follow back up closely with his PCP and cardiology he was given strict return precautions.  He did have a fall with a left-sided chest wall pain walking into the hospital, he states a very strong wind blew and he got off balance, could not regain his balance and fell onto the left side, he did not have head injury and he is not on blood thinners.  CT chest ordered to rule out any rib fractures and this was negative and again showed no pulmonary edema patient informed of incidental findings and need for outpatient follow-up. [CB]    Clinical Course User Index [CB] Suellen Sherran LABOR, PA-C                                 Medical Decision Making Parental diagnosis includes but limited to CHF, pulmonary edema, pneumonia, PE, contusion, fracture, other  ED course: Patient told to come in for possible fluid overload by his PCP, he does not follow with cardiology currently, does have a history of heart disease in the past that is post CABG but does not follow with cardiology.  He reports  he has been out of breath only when laying back, no chest pain, no exertional symptoms, no sweating or dizziness, EKG today is just reassuring, his proBNP on outpatient labs showed level of 640 which is not consistent with heart failure per patient's age, he had no pulmonary edema he is not hypoxic, we did  give a dose of IV Lasix  here and he is significant diuresis he reports subjectively feeling much better, he is going to pick up his lisinopril  HCTZ and advised on cardiology follow-up since he does have history of CABG and is not currently follow-up with cardiology.  He was given strict return precautions but at this time I do not have criteria to admit him and he is agreeable with going home, he has ambulated the bathroom without any dyspnea or difficulty will times, he does have some pain to his left lateral chest after a fall today that was mechanical in nature but CT did not show any fractures.  Discussed with patient supportive care for this.  Amount and/or Complexity of Data Reviewed Radiology: ordered.  Risk Prescription drug management.        Final diagnoses:  Peripheral edema  SOB (shortness of breath)  Contusion of left chest wall, initial encounter  Orthopnea    ED Discharge Orders          Ordered    Ambulatory referral to Cardiology       Comments: If you have not heard from the Cardiology office within the next 72 hours please call [phone].   11/08/24 2109               Suellen Sherran LABOR, PA-C 11/08/24 2352    Patsey Lot, MD 11/25/24 1453  "

## 2024-11-08 NOTE — Discharge Instructions (Addendum)
 You are seen in the emergency room today for evaluation of swelling in your legs and shortness of breath.  This is likely due to you being out of your HCTZ medicine.  We did give you a dose of IV Lasix  to help reduce the volume of fluid.  You have no evidence of fluid in your lungs on your x-ray from earlier today or on your CT scan that we did to evaluate for rib fractures as well.  Your oxygen is normal.  Your doctor had called back in your blood pressure medication so you can pick this up from the pharmacy and start taking this again tomorrow.  If you develop worsening shortness of breath, chest pain, fever or chills please come back to the ER.  You can take over-the-counter medication as needed for discomfort where you had fallen.  You are having the swelling, it would be prudent for you to follow-up with cardiology for further evaluation such as a possible echocardiogram to evaluate your heart function.

## 2024-12-07 ENCOUNTER — Ambulatory Visit

## 2024-12-31 ENCOUNTER — Ambulatory Visit: Admitting: Cardiology
